# Patient Record
Sex: Female | Born: 1950
Health system: Southern US, Community
[De-identification: ages and names within clinical notes are randomized; demographics above are authoritative.]

## PROBLEM LIST (undated history)

## (undated) DIAGNOSIS — J302 Other seasonal allergic rhinitis: Secondary | ICD-10-CM

## (undated) DIAGNOSIS — K635 Polyp of colon: Secondary | ICD-10-CM

## (undated) DIAGNOSIS — E119 Type 2 diabetes mellitus without complications: Secondary | ICD-10-CM

## (undated) DIAGNOSIS — E785 Hyperlipidemia, unspecified: Secondary | ICD-10-CM

## (undated) DIAGNOSIS — S2220XA Unspecified fracture of sternum, initial encounter for closed fracture: Secondary | ICD-10-CM

## (undated) DIAGNOSIS — S134XXA Sprain of ligaments of cervical spine, initial encounter: Secondary | ICD-10-CM

## (undated) HISTORY — DX: Hyperlipidemia, unspecified: E78.5

## (undated) HISTORY — DX: Unspecified fracture of sternum, initial encounter for closed fracture: S22.20XA

## (undated) HISTORY — DX: Type 2 diabetes mellitus without complications: E11.9

## (undated) HISTORY — DX: Sprain of ligaments of cervical spine, initial encounter: S13.4XXA

## (undated) HISTORY — PX: COLONOSCOPY: SHX174

---

## 2005-01-16 ENCOUNTER — Ambulatory Visit: Payer: Self-pay | Admitting: Family Medicine

## 2005-01-30 ENCOUNTER — Ambulatory Visit: Payer: Self-pay | Admitting: Family Medicine

## 2005-02-19 ENCOUNTER — Ambulatory Visit: Payer: Self-pay | Admitting: Family Medicine

## 2005-02-26 ENCOUNTER — Ambulatory Visit: Payer: Self-pay | Admitting: Family Medicine

## 2005-05-07 ENCOUNTER — Ambulatory Visit: Payer: Self-pay | Admitting: Family Medicine

## 2005-07-02 ENCOUNTER — Ambulatory Visit: Payer: Self-pay | Admitting: Family Medicine

## 2005-08-20 ENCOUNTER — Ambulatory Visit: Payer: Self-pay | Admitting: Family Medicine

## 2005-12-16 ENCOUNTER — Ambulatory Visit: Payer: Self-pay | Admitting: Family Medicine

## 2006-01-25 ENCOUNTER — Ambulatory Visit: Payer: Self-pay | Admitting: Family Medicine

## 2006-03-25 ENCOUNTER — Ambulatory Visit: Payer: Self-pay | Admitting: Family Medicine

## 2006-06-21 ENCOUNTER — Ambulatory Visit: Payer: Self-pay | Admitting: Family Medicine

## 2006-09-13 ENCOUNTER — Ambulatory Visit: Payer: Self-pay | Admitting: Family Medicine

## 2006-12-21 ENCOUNTER — Ambulatory Visit: Payer: Self-pay | Admitting: Family Medicine

## 2007-01-21 ENCOUNTER — Ambulatory Visit: Payer: Self-pay | Admitting: Family Medicine

## 2008-02-14 ENCOUNTER — Other Ambulatory Visit: Admission: RE | Admit: 2008-02-14 | Discharge: 2008-02-14 | Payer: Self-pay | Admitting: Obstetrics and Gynecology

## 2009-09-03 ENCOUNTER — Other Ambulatory Visit: Admission: RE | Admit: 2009-09-03 | Discharge: 2009-09-03 | Payer: Self-pay | Admitting: Obstetrics and Gynecology

## 2015-08-22 DIAGNOSIS — E119 Type 2 diabetes mellitus without complications: Secondary | ICD-10-CM | POA: Insufficient documentation

## 2015-08-22 DIAGNOSIS — I10 Essential (primary) hypertension: Secondary | ICD-10-CM | POA: Insufficient documentation

## 2015-12-02 ENCOUNTER — Ambulatory Visit (INDEPENDENT_AMBULATORY_CARE_PROVIDER_SITE_OTHER): Payer: BLUE CROSS/BLUE SHIELD | Admitting: Family Medicine

## 2015-12-02 ENCOUNTER — Encounter: Payer: Self-pay | Admitting: Family Medicine

## 2015-12-02 VITALS — BP 114/69 | HR 72 | Temp 97.4°F | Ht 62.0 in | Wt 126.0 lb

## 2015-12-02 DIAGNOSIS — J0100 Acute maxillary sinusitis, unspecified: Secondary | ICD-10-CM | POA: Diagnosis not present

## 2015-12-02 MED ORDER — AMOXICILLIN-POT CLAVULANATE 875-125 MG PO TABS: 1.0000 | ORAL_TABLET | Freq: Two times a day (BID) | ORAL | Status: DC

## 2015-12-02 NOTE — Progress Notes (Signed)
   Subjective:    Patient ID: Misty [name], female    DOB: 06-Jul-1951, 65 y.o.   MRN: [id number]  HPI Patient here today for cough, congestion and facial pressure. These symptoms have been present off and on since October. She denies any chest congestion or wheezing. There is been no fever.  She is transferring here from another practice. She has diabetes and takes metformin and glipizide. She also is on a statin drug for her lipids.      There are no active problems to display for this patient.  Outpatient Encounter Prescriptions as of 12/02/2015  Medication Sig  . glipiZIDE (GLIPIZIDE XL) 5 MG 24 hr tablet Take 1 tablet by mouth daily.  . metFORMIN (GLUCOPHAGE-XR) 500 MG 24 hr tablet Take 1 tablet by mouth daily.  . simvastatin (ZOCOR) 40 MG tablet Take 1 tablet by mouth daily.  . vitamin B-12 (CYANOCOBALAMIN) 250 MCG tablet Take by mouth.  . vitamin E 400 UNIT capsule Take by mouth.   No facility-administered encounter medications on file as of 12/02/2015.      Review of Systems  Constitutional: Negative.  Negative for fever.  HENT: Positive for congestion, sinus pressure and sore throat.   Eyes: Negative.   Respiratory: Positive for cough.   Cardiovascular: Negative.   Gastrointestinal: Negative.   Endocrine: Negative.   Genitourinary: Negative.   Musculoskeletal: Negative.   Skin: Negative.   Allergic/Immunologic: Negative.   Neurological: Negative.   Hematological: Negative.   Psychiatric/Behavioral: Negative.        Objective:   Physical Exam  Constitutional: She is oriented to person, place, and time. She appears well-developed and well-nourished.  HENT:  Head: Normocephalic.  Maxillary sinuses are tender to percussion  Cardiovascular: Normal rate and regular rhythm.   Pulmonary/Chest: Effort normal and breath sounds normal.  Neurological: She is alert and oriented to person, place, and time.   BP 114/69 mmHg  Pulse 72  Temp(Src) 97.4 F (36.3 C)  (Oral)  Ht 5\' 2"  (1.575 m)  Wt 126 lb (57.153 kg)  BMI 23.04 kg/m2        Assessment & Plan:   1. Acute maxillary sinusitis, recurrence not specified Given the longevity of her symptoms and colored drainage will treat: Amoxicillin. Energy 75 mg twice a day 10 days. Continue Mucinex area  She will return in one month. Hopefully her records from all practice will be here and we can see how her diabetes is doing well with A1c and lipids.  Wardell Honour MD

## 2015-12-11 ENCOUNTER — Other Ambulatory Visit: Payer: Self-pay | Admitting: Family Medicine

## 2015-12-11 ENCOUNTER — Other Ambulatory Visit: Payer: Self-pay

## 2015-12-11 MED ORDER — METFORMIN HCL ER 500 MG PO TB24: 500.0000 mg | ORAL_TABLET | Freq: Every day | ORAL | Status: DC

## 2015-12-12 NOTE — Telephone Encounter (Signed)
Seen 1/23 - miller for sinus -- no labs - no chronic visits Can we approve until a appt or NTBS first

## 2015-12-12 NOTE — Telephone Encounter (Signed)
Pt has only been seen here once, no labs in epic?

## 2015-12-13 MED ORDER — SIMVASTATIN 40 MG PO TABS: 40.0000 mg | ORAL_TABLET | Freq: Every day | ORAL | Status: DC

## 2015-12-13 MED ORDER — GLIPIZIDE ER 5 MG PO TB24: 5.0000 mg | ORAL_TABLET | Freq: Every day | ORAL | Status: DC

## 2015-12-13 MED ORDER — METFORMIN HCL ER 500 MG PO TB24: 500.0000 mg | ORAL_TABLET | Freq: Every day | ORAL | Status: DC

## 2015-12-17 ENCOUNTER — Ambulatory Visit (INDEPENDENT_AMBULATORY_CARE_PROVIDER_SITE_OTHER): Payer: BLUE CROSS/BLUE SHIELD | Admitting: Family Medicine

## 2015-12-17 ENCOUNTER — Encounter: Payer: Self-pay | Admitting: Family Medicine

## 2015-12-17 VITALS — BP 122/79 | HR 84 | Temp 97.9°F | Ht 62.0 in | Wt 125.0 lb

## 2015-12-17 DIAGNOSIS — J320 Chronic maxillary sinusitis: Secondary | ICD-10-CM | POA: Insufficient documentation

## 2015-12-17 MED ORDER — AZITHROMYCIN 250 MG PO TABS: ORAL_TABLET | ORAL | Status: DC

## 2015-12-17 MED ORDER — FLUTICASONE PROPIONATE 50 MCG/ACT NA SUSP: 2.0000 | Freq: Every day | NASAL | Status: DC

## 2015-12-17 NOTE — Progress Notes (Signed)
   Subjective:    Patient ID: Misty [name], female    DOB: 12-20-1950, 65 y.o.   MRN: [id number]  HPI Patient here today for cough and congestion that is on- going since last office visit 12/02/15. Patient tells me she is not any better from her last visit and her prescription of Augmentin and Mucinex. Complaints are runny nose sneezing coughing years or stopped up and postnasal drainage that is thick and white. When questioned about the possibility of allergies she has no history of same but admits it could be. Certainly some of her symptoms are allergic sounding. She feels like most of her symptoms are from the neck up other than in her chest.       There are no active problems to display for this patient.  Outpatient Encounter Prescriptions as of 12/17/2015  Medication Sig  . glipiZIDE (GLIPIZIDE XL) 5 MG 24 hr tablet Take 1 tablet (5 mg total) by mouth daily.  . metFORMIN (GLUCOPHAGE-XR) 500 MG 24 hr tablet Take 1 tablet (500 mg total) by mouth daily.  . simvastatin (ZOCOR) 40 MG tablet Take 1 tablet (40 mg total) by mouth daily.  . vitamin B-12 (CYANOCOBALAMIN) 250 MCG tablet Take by mouth.  . vitamin E 400 UNIT capsule Take by mouth.  . [DISCONTINUED] amoxicillin-clavulanate (AUGMENTIN) 875-125 MG tablet Take 1 tablet by mouth 2 (two) times daily.   No facility-administered encounter medications on file as of 12/17/2015.      Review of Systems  Constitutional: Negative.   HENT: Positive for congestion (thick - white), postnasal drip and sneezing.   Eyes: Negative.   Respiratory: Positive for cough.   Cardiovascular: Negative.   Gastrointestinal: Negative.   Endocrine: Negative.   Genitourinary: Negative.   Musculoskeletal: Negative.   Skin: Negative.   Allergic/Immunologic: Negative.   Neurological: Negative.   Hematological: Negative.   Psychiatric/Behavioral: Negative.        Objective:   Physical Exam  Constitutional: She is oriented to person, place, and time.  She appears well-developed and well-nourished.  HENT:  Head: Normocephalic.  Right Ear: External ear normal.  Left Ear: External ear normal.  Mouth/Throat: Oropharynx is clear and moist. No oropharyngeal exudate.  Pulmonary/Chest: Effort normal and breath sounds normal.  Neurological: She is alert and oriented to person, place, and time.  Psychiatric: She has a normal mood and affect. Her behavior is normal.   BP 122/79 mmHg  Pulse 84  Temp(Src) 97.9 F (36.6 C) (Oral)  Ht 5\' 2"  (1.575 m)  Wt 125 lb (56.7 kg)  BMI 22.86 kg/m2        Assessment & Plan:  1. Sinusitis, maxillary, chronic I'm beginning to suspect her symptoms are more allergic than infectious but will try one core more course of Zithromax along with Flonase. Also talked about ways to thin mucus such as hot showers hyper Rizer drinking plenty of fluids. It seems that she does not want to continue with Mucinex.  Wardell Honour MD

## 2015-12-17 NOTE — Addendum Note (Signed)
Addended by: Zannie Cove on: 12/17/2015 01:53 PM   Modules accepted: Orders

## 2016-02-24 ENCOUNTER — Other Ambulatory Visit: Payer: Self-pay | Admitting: *Deleted

## 2016-02-24 NOTE — Telephone Encounter (Signed)
No labs in EPIC

## 2016-02-25 MED ORDER — SIMVASTATIN 40 MG PO TABS: 40.0000 mg | ORAL_TABLET | Freq: Every day | ORAL | Status: DC

## 2016-02-27 ENCOUNTER — Telehealth: Payer: Self-pay | Admitting: Family Medicine

## 2016-02-27 DIAGNOSIS — E119 Type 2 diabetes mellitus without complications: Secondary | ICD-10-CM

## 2016-02-27 DIAGNOSIS — I10 Essential (primary) hypertension: Secondary | ICD-10-CM

## 2016-02-27 NOTE — Telephone Encounter (Signed)
Lipids, A1C, CMP

## 2016-02-28 NOTE — Telephone Encounter (Signed)
Detailed message left for patient that order has been placed.

## 2016-03-04 ENCOUNTER — Other Ambulatory Visit: Payer: BLUE CROSS/BLUE SHIELD

## 2016-03-04 DIAGNOSIS — E119 Type 2 diabetes mellitus without complications: Secondary | ICD-10-CM

## 2016-03-04 DIAGNOSIS — I10 Essential (primary) hypertension: Secondary | ICD-10-CM

## 2016-03-04 LAB — CMP14+EGFR
ALBUMIN: 4.2 g/dL (ref 3.6–4.8)
ALK PHOS: 87 IU/L (ref 39–117)
ALT: 18 IU/L (ref 0–32)
AST: 21 IU/L (ref 0–40)
Albumin/Globulin Ratio: 1.7 (ref 1.2–2.2)
BILIRUBIN TOTAL: 0.4 mg/dL (ref 0.0–1.2)
BUN / CREAT RATIO: 19 (ref 12–28)
BUN: 16 mg/dL (ref 8–27)
CHLORIDE: 99 mmol/L (ref 96–106)
CO2: 24 mmol/L (ref 18–29)
Calcium: 9.5 mg/dL (ref 8.7–10.3)
Creatinine, Ser: 0.85 mg/dL (ref 0.57–1.00)
GFR calc Af Amer: 83 mL/min/{1.73_m2} (ref >59)
GFR calc non Af Amer: 72 mL/min/{1.73_m2} (ref >59)
GLOBULIN, TOTAL: 2.5 g/dL (ref 1.5–4.5)
Glucose: 146 mg/dL — ABNORMAL HIGH (ref 65–99)
Potassium: 4.9 mmol/L (ref 3.5–5.2)
SODIUM: 141 mmol/L (ref 134–144)
Total Protein: 6.7 g/dL (ref 6.0–8.5)

## 2016-03-04 LAB — LIPID PANEL
CHOLESTEROL TOTAL: 296 mg/dL — AB (ref 100–199)
Chol/HDL Ratio: 3.7 ratio units (ref 0.0–4.4)
HDL: 81 mg/dL (ref >39)
LDL CALC: 163 mg/dL — AB (ref 0–99)
TRIGLYCERIDES: 258 mg/dL — AB (ref 0–149)
VLDL CHOLESTEROL CAL: 52 mg/dL — AB (ref 5–40)

## 2016-03-04 LAB — BAYER DCA HB A1C WAIVED: HB A1C (BAYER DCA - WAIVED): 7.8 % — ABNORMAL HIGH (ref <7.0)

## 2016-03-05 ENCOUNTER — Encounter: Payer: Self-pay | Admitting: Family Medicine

## 2016-03-05 ENCOUNTER — Ambulatory Visit (INDEPENDENT_AMBULATORY_CARE_PROVIDER_SITE_OTHER): Payer: BLUE CROSS/BLUE SHIELD | Admitting: Family Medicine

## 2016-03-05 ENCOUNTER — Encounter (INDEPENDENT_AMBULATORY_CARE_PROVIDER_SITE_OTHER): Payer: Self-pay

## 2016-03-05 ENCOUNTER — Telehealth: Payer: Self-pay | Admitting: Family Medicine

## 2016-03-05 VITALS — BP 127/68 | HR 85 | Temp 97.2°F | Ht 62.0 in | Wt 125.8 lb

## 2016-03-05 DIAGNOSIS — E785 Hyperlipidemia, unspecified: Secondary | ICD-10-CM | POA: Diagnosis not present

## 2016-03-05 DIAGNOSIS — K219 Gastro-esophageal reflux disease without esophagitis: Secondary | ICD-10-CM

## 2016-03-05 DIAGNOSIS — E1169 Type 2 diabetes mellitus with other specified complication: Secondary | ICD-10-CM | POA: Insufficient documentation

## 2016-03-05 DIAGNOSIS — E119 Type 2 diabetes mellitus without complications: Secondary | ICD-10-CM | POA: Diagnosis not present

## 2016-03-05 MED ORDER — SITAGLIPTIN PHOS-METFORMIN HCL 50-500 MG PO TABS: 1.0000 | ORAL_TABLET | Freq: Two times a day (BID) | ORAL | Status: DC

## 2016-03-05 MED ORDER — RANITIDINE HCL 150 MG PO TABS: 150.0000 mg | ORAL_TABLET | Freq: Two times a day (BID) | ORAL | Status: DC

## 2016-03-05 MED ORDER — SIMVASTATIN 40 MG PO TABS: 40.0000 mg | ORAL_TABLET | Freq: Every day | ORAL | Status: DC

## 2016-03-05 NOTE — Telephone Encounter (Signed)
Have her stay on her previous meds for now and increase the metformin to twice a day until I can find one that is more affordable for her. Also asked her how much it was going to cost her so I can have a gauge of we can send in the future

## 2016-03-05 NOTE — Telephone Encounter (Signed)
Patient aware of medication change and states that Janumet was $160

## 2016-03-05 NOTE — Progress Notes (Signed)
BP 127/68 mmHg  Pulse 85  Temp(Src) 97.2 F (36.2 C) (Oral)  Ht 5\' 2"  (1.575 m)  Wt 125 lb 12.8 oz (57.063 kg)  BMI 23.00 kg/m2   Subjective:    Patient ID: Misty [name], female    DOB: Dec 02, 1950, 65 y.o.   MRN: [id number]  HPI: Misty [name] is a 65 y.o. female presenting on 03/05/2016 for Follow-up   HPI Type 2 diabetes follow-up Patient is coming in today for follow-up on type 2 diabetes. This is the first time that she has seen [place] for her diabetes. Before she was seen another primary care physician in town. She has been on metformin 500 mg daily and glipizide 5 mg daily. Her hemoglobin A1c that she had drawn yesterday came back as 7.8. She denies any issues with her vision that is changed or with her feet. She is going to see her ophthalmologist in July of this year and artery has an appointment. She sees them every year. She is not currently on an ACE inhibitor and may have been intolerant of them in the past because of hypotension. She says her blood sugars run anywhere from 90s to 140 5 in the morning. She does not often check in the afternoons.  Hyperlipidemia This is the first of the patient is coming to [place] for her cholesterol. She has been on simvastatin 40 mg but has been off of that more recently for one month because she ran out. Her LDL cholesterol 163 yesterday and her triglycerides came back in the 250s. Patient denies headaches, blurred vision, chest pains, shortness of breath, or weakness. Denies any side effects from medication and is content with current medication.   Persisting cough Patient has been having a persistent cough and feeling like something is stuck in her throat that she cannot cough up her swallow down. She denies any actual heart burning sensation but she does have a lot of frequent hiccups and sometimes belching. She also has been having this persistent cough because of this tickle in the back of her throat. The cough is nonproductive. She has  been on both antihistamines and nasal steroids and does not feel like they are helping at all.  Relevant past medical, surgical, family and social history reviewed and updated as indicated. Interim medical history since our last visit reviewed. Allergies and medications reviewed and updated.  Review of Systems  Constitutional: Negative for fever and chills.  HENT: Positive for sore throat. Negative for congestion, ear discharge, ear pain, rhinorrhea, sinus pressure and sneezing.   Eyes: Negative for redness and visual disturbance.  Respiratory: Positive for cough. Negative for chest tightness and shortness of breath.   Cardiovascular: Negative for chest pain and leg swelling.  Genitourinary: Negative for dysuria and difficulty urinating.  Musculoskeletal: Negative for back pain and gait problem.  Skin: Negative for rash.  Neurological: Negative for light-headedness and headaches.  Psychiatric/Behavioral: Negative for behavioral problems and agitation.  All other systems reviewed and are negative.   Per HPI unless specifically indicated above     Medication List       This list is accurate as of: 03/05/16  1:31 PM.  Always use your most recent med list.               fluticasone 50 MCG/ACT nasal spray  Commonly known as:  FLONASE  Place 2 sprays into both nostrils daily.     ranitidine 150 MG tablet  Commonly known as:  ZANTAC  Take 1 tablet (150 mg total) by mouth 2 (two) times daily.     simvastatin 40 MG tablet  Commonly known as:  ZOCOR  Take 1 tablet (40 mg total) by mouth daily.     sitaGLIPtin-metformin 50-500 MG tablet  Commonly known as:  JANUMET  Take 1 tablet by mouth 2 (two) times daily with a meal.     vitamin B-12 250 MCG tablet  Commonly known as:  CYANOCOBALAMIN  Take by mouth.     vitamin E 400 UNIT capsule  Take by mouth.           Objective:    BP 127/68 mmHg  Pulse 85  Temp(Src) 97.2 F (36.2 C) (Oral)  Ht 5\' 2"  (1.575 m)  Wt 125 lb  12.8 oz (57.063 kg)  BMI 23.00 kg/m2  Wt Readings from Last 3 Encounters:  03/05/16 125 lb 12.8 oz (57.063 kg)  12/17/15 125 lb (56.7 kg)  12/02/15 126 lb (57.153 kg)    Physical Exam  Constitutional: She is oriented to person, place, and time. She appears well-developed and well-nourished. No distress.  HENT:  Right Ear: External ear normal.  Left Ear: External ear normal.  Nose: Nose normal.  Mouth/Throat: Oropharynx is clear and moist. No oropharyngeal exudate.  Eyes: Conjunctivae and EOM are normal. Pupils are equal, round, and reactive to light.  Neck: Neck supple. No thyromegaly present.  Cardiovascular: Normal rate, regular rhythm, normal heart sounds and intact distal pulses.   No murmur heard. Pulmonary/Chest: Effort normal and breath sounds normal. No respiratory distress. She has no wheezes.  Abdominal: Soft. Bowel sounds are normal. She exhibits no distension. There is tenderness (Mild epigastric tenderness). There is no rebound.  Musculoskeletal: Normal range of motion. She exhibits no edema or tenderness.  Lymphadenopathy:    She has no cervical adenopathy.  Neurological: She is alert and oriented to person, place, and time. Coordination normal.  Skin: Skin is warm and dry. No rash noted. She is not diaphoretic.  Psychiatric: She has a normal mood and affect. Her behavior is normal.  Nursing note and vitals reviewed.  Diabetic Foot Exam - Simple   Simple Foot Form  Diabetic Foot exam was performed with the following findings:  Yes 03/05/2016  1:46 PM  Visual Inspection  No deformities, no ulcerations, no other skin breakdown bilaterally:  Yes  Sensation Testing  Intact to touch and monofilament testing bilaterally:  Yes  Pulse Check  Posterior Tibialis and Dorsalis pulse intact bilaterally:  Yes  Comments      Hemoglobin A1c: 7.8    Assessment & Plan:       Problem List Items Addressed This Visit      Endocrine   Type 2 diabetes mellitus ([place]) - Primary     Relevant Medications   sitaGLIPtin-metformin (JANUMET) 50-500 MG tablet   simvastatin (ZOCOR) 40 MG tablet   Other Relevant Orders   Microalbumin / creatinine urine ratio     Other   Hyperlipidemia LDL goal <130   Relevant Medications   simvastatin (ZOCOR) 40 MG tablet    Other Visit Diagnoses    Gastroesophageal reflux disease without esophagitis        Relevant Medications    ranitidine (ZANTAC) 150 MG tablet        Follow up plan: Return in about 3 months (around 06/04/2016), or if symptoms worsen or fail to improve, for Diabetes follow-up and A1c.  Counseling provided for all of the vaccine components Orders Placed This  Encounter  Procedures  . Microalbumin / creatinine urine ratio    Caryl Pina, MD [facility] 03/05/2016, 1:31 PM

## 2016-03-06 ENCOUNTER — Ambulatory Visit: Payer: BLUE CROSS/BLUE SHIELD | Admitting: Family Medicine

## 2016-03-06 ENCOUNTER — Telehealth: Payer: Self-pay

## 2016-03-06 LAB — MICROALBUMIN / CREATININE URINE RATIO
CREATININE, UR: 124.4 mg/dL
MICROALB/CREAT RATIO: 6.7 mg/g{creat} (ref 0.0–30.0)
MICROALBUM., U, RANDOM: 8.3 ug/mL

## 2016-03-06 MED ORDER — CANAGLIFLOZIN-METFORMIN HCL ER 50-500 MG PO TB24: 1.0000 | ORAL_TABLET | Freq: Two times a day (BID) | ORAL | Status: DC

## 2016-03-06 NOTE — Addendum Note (Signed)
Addended by: Wardell Heath on: 03/06/2016 11:22 AM   Modules accepted: Orders

## 2016-03-06 NOTE — Telephone Encounter (Signed)
Can we try and send her Invokamet 50-500, 1 tablet twice a day #60 with 2 refills. Call the pharmacy as well and find out why the Janumet cost $160 and if it was just her deductible or if that would be a continuing monthly payment. See if the pharmacy can tell us if the Invokamet is cheaper or better covered by her insurance and how much it might cost her. I want her to try to be on either the Invokamet or Janumet

## 2016-03-06 NOTE — Telephone Encounter (Signed)
Insurance prior authorized Invokamet through 03/06/17

## 2016-03-10 ENCOUNTER — Telehealth: Payer: Self-pay | Admitting: Family Medicine

## 2016-03-10 MED ORDER — METFORMIN HCL ER 500 MG PO TB24: 500.0000 mg | ORAL_TABLET | Freq: Two times a day (BID) | ORAL | Status: DC

## 2016-03-10 MED ORDER — GLIPIZIDE ER 5 MG PO TB24: 5.0000 mg | ORAL_TABLET | Freq: Every day | ORAL | Status: DC

## 2016-03-10 NOTE — Telephone Encounter (Signed)
Sent metformin and glipizide for her

## 2016-03-10 NOTE — Telephone Encounter (Signed)
Detailed message left for patient that medication has been sent to pharmacy.

## 2016-05-19 ENCOUNTER — Other Ambulatory Visit: Payer: BLUE CROSS/BLUE SHIELD

## 2016-06-04 ENCOUNTER — Ambulatory Visit: Payer: BLUE CROSS/BLUE SHIELD | Admitting: Family Medicine

## 2016-06-05 ENCOUNTER — Other Ambulatory Visit: Payer: Self-pay | Admitting: *Deleted

## 2016-06-05 DIAGNOSIS — Z1231 Encounter for screening mammogram for malignant neoplasm of breast: Secondary | ICD-10-CM

## 2016-06-12 ENCOUNTER — Other Ambulatory Visit: Payer: Self-pay | Admitting: Family Medicine

## 2016-06-12 ENCOUNTER — Other Ambulatory Visit: Payer: BLUE CROSS/BLUE SHIELD

## 2016-06-12 DIAGNOSIS — E785 Hyperlipidemia, unspecified: Secondary | ICD-10-CM

## 2016-06-12 DIAGNOSIS — E119 Type 2 diabetes mellitus without complications: Secondary | ICD-10-CM

## 2016-06-12 DIAGNOSIS — Z1159 Encounter for screening for other viral diseases: Secondary | ICD-10-CM

## 2016-06-12 LAB — BAYER DCA HB A1C WAIVED: HB A1C: 7.5 % — AB (ref <7.0)

## 2016-06-13 LAB — LIPID PANEL
CHOL/HDL RATIO: 2.5 ratio (ref 0.0–4.4)
CHOLESTEROL TOTAL: 212 mg/dL — AB (ref 100–199)
HDL: 84 mg/dL (ref >39)
LDL CALC: 89 mg/dL (ref 0–99)
Triglycerides: 196 mg/dL — ABNORMAL HIGH (ref 0–149)
VLDL CHOLESTEROL CAL: 39 mg/dL (ref 5–40)

## 2016-06-13 LAB — HEPATITIS C ANTIBODY: Hep C Virus Ab: <0.1 s/co ratio (ref 0.0–0.9)

## 2016-06-22 ENCOUNTER — Ambulatory Visit (INDEPENDENT_AMBULATORY_CARE_PROVIDER_SITE_OTHER): Payer: BLUE CROSS/BLUE SHIELD | Admitting: Family Medicine

## 2016-06-22 ENCOUNTER — Encounter: Payer: Self-pay | Admitting: Family Medicine

## 2016-06-22 VITALS — BP 135/86 | HR 68 | Temp 97.5°F | Ht 62.0 in | Wt 126.8 lb

## 2016-06-22 DIAGNOSIS — E785 Hyperlipidemia, unspecified: Secondary | ICD-10-CM | POA: Diagnosis not present

## 2016-06-22 DIAGNOSIS — E119 Type 2 diabetes mellitus without complications: Secondary | ICD-10-CM

## 2016-06-22 MED ORDER — GLIPIZIDE ER 5 MG PO TB24: 5.0000 mg | ORAL_TABLET | Freq: Every day | ORAL | 3 refills | Status: DC

## 2016-06-22 MED ORDER — METFORMIN HCL ER 500 MG PO TB24: 500.0000 mg | ORAL_TABLET | Freq: Two times a day (BID) | ORAL | 3 refills | Status: DC

## 2016-06-22 MED ORDER — EMPAGLIFLOZIN 10 MG PO TABS: 10.0000 mg | ORAL_TABLET | Freq: Every day | ORAL | 3 refills | Status: DC

## 2016-06-22 MED ORDER — SIMVASTATIN 40 MG PO TABS: 40.0000 mg | ORAL_TABLET | Freq: Every day | ORAL | 2 refills | Status: DC

## 2016-06-22 NOTE — Progress Notes (Signed)
BP 135/86 (BP Location: Left Arm, Patient Position: Sitting, Cuff Size: Normal)   Pulse 68   Temp 97.5 F (36.4 C) (Oral)   Ht 5\' 2"  (1.575 m)   Wt 126 lb 12.8 oz (57.5 kg)   BMI 23.19 kg/m    Subjective:    Patient ID: Misty [name], female    DOB: Oct 14, 1951, 65 y.o.   MRN: [id number]  HPI: Misty [name] is a 65 y.o. female presenting on 06/22/2016 for Diabetes (followup, labs performed last week)   HPI Diabetes recheck Patient is coming in today for a diabetic recheck. Because of insurance she was unable to afford [place] and Invokana. She is currently taking metformin 500 mg once a day. And glipizide 5 mg daily. Patient is not on an ACE inhibitor currently. She denies any issues with her feet or with her vision. She has seen an ophthalmologist previously and plans to go see 1 next month. Patient is on statin. Her hemoglobin A1c improved from 7.8 down to 7.5. Encouraged taking the metformin more frequently.  Hyperlipidemia Patient is coming in for cholesterol recheck. She denies any issues with myalgias or other issues with her medication. She has been taking them every evening. Her cholesterol was checked a few days ago and has much improved from her previous. Her LDL came back as 89 and triglycerides is 196 and HDL was 84.  Relevant past medical, surgical, family and social history reviewed and updated as indicated. Interim medical history since our last visit reviewed. Allergies and medications reviewed and updated.  Review of Systems  Constitutional: Negative for chills and fever.  HENT: Negative for congestion, ear discharge and ear pain.   Eyes: Negative for redness and visual disturbance.  Respiratory: Negative for chest tightness and shortness of breath.   Cardiovascular: Negative for chest pain and leg swelling.  Genitourinary: Negative for difficulty urinating and dysuria.  Musculoskeletal: Negative for back pain and gait problem.  Skin: Negative for rash.    Neurological: Negative for light-headedness and headaches.  Psychiatric/Behavioral: Negative for agitation and behavioral problems.  All other systems reviewed and are negative.   Per HPI unless specifically indicated above     Medication List       Accurate as of 06/22/16  8:56 AM. Always use your most recent med list.          empagliflozin 10 MG Tabs tablet Commonly known as:  JARDIANCE Take 10 mg by mouth daily.   glipiZIDE 5 MG 24 hr tablet Commonly known as:  GLIPIZIDE XL Take 1 tablet (5 mg total) by mouth daily.   metFORMIN 500 MG 24 hr tablet Commonly known as:  GLUCOPHAGE-XR Take 1 tablet (500 mg total) by mouth 2 (two) times daily.   simvastatin 40 MG tablet Commonly known as:  ZOCOR Take 1 tablet (40 mg total) by mouth daily.          Objective:    BP 135/86 (BP Location: Left Arm, Patient Position: Sitting, Cuff Size: Normal)   Pulse 68   Temp 97.5 F (36.4 C) (Oral)   Ht 5\' 2"  (1.575 m)   Wt 126 lb 12.8 oz (57.5 kg)   BMI 23.19 kg/m   Wt Readings from Last 3 Encounters:  06/22/16 126 lb 12.8 oz (57.5 kg)  03/05/16 125 lb 12.8 oz (57.1 kg)  12/17/15 125 lb (56.7 kg)    Physical Exam  Constitutional: She is oriented to person, place, and time. She appears well-developed and well-nourished. No  distress.  Eyes: Conjunctivae and EOM are normal. Pupils are equal, round, and reactive to light.  Neck: Neck supple. No thyromegaly present.  Cardiovascular: Normal rate, regular rhythm, normal heart sounds and intact distal pulses.   No murmur heard. Pulmonary/Chest: Effort normal and breath sounds normal. No respiratory distress. She has no wheezes.  Musculoskeletal: Normal range of motion. She exhibits no edema or tenderness.  Lymphadenopathy:    She has no cervical adenopathy.  Neurological: She is alert and oriented to person, place, and time. Coordination normal.  Skin: Skin is warm and dry. No rash noted. She is not diaphoretic.  Psychiatric:  She has a normal mood and affect. Her behavior is normal.  Nursing note and vitals reviewed.   Results for orders placed or performed in visit on 06/12/16  Bayer DCA Hb A1c Waived  Result Value Ref Range   Bayer DCA Hb A1c Waived 7.5 (H) <7.0 %  Hepatitis C antibody  Result Value Ref Range   Hep C Virus Ab <0.1 0.0 - 0.9 s/co ratio  Lipid panel  Result Value Ref Range   Cholesterol, Total 212 (H) 100 - 199 mg/dL   Triglycerides 196 (H) 0 - 149 mg/dL   HDL 84 >39 mg/dL   VLDL Cholesterol Cal 39 5 - 40 mg/dL   LDL Calculated 89 0 - 99 mg/dL   Chol/HDL Ratio 2.5 0.0 - 4.4 ratio units      Assessment & Plan:   Problem List Items Addressed This Visit      Endocrine   Type 2 diabetes mellitus ([facility]) - Primary   Relevant Medications   simvastatin (ZOCOR) 40 MG tablet   metFORMIN (GLUCOPHAGE-XR) 500 MG 24 hr tablet   glipiZIDE (GLIPIZIDE XL) 5 MG 24 hr tablet   empagliflozin (JARDIANCE) 10 MG TABS tablet     Other   Hyperlipidemia LDL goal <130   Relevant Medications   simvastatin (ZOCOR) 40 MG tablet    Other Visit Diagnoses   None.      Follow up plan: Return in about 3 months (around 09/22/2016), or if symptoms worsen or fail to improve, for Recheck diabetes.  Counseling provided for all of the vaccine components No orders of the defined types were placed in this encounter.   Caryl Pina, MD [facility] 06/22/2016, 8:56 AM

## 2016-06-25 ENCOUNTER — Telehealth: Payer: Self-pay | Admitting: Family Medicine

## 2016-06-25 NOTE — Telephone Encounter (Signed)
Spoke to pt has upcoming apts

## 2016-07-03 LAB — HM DIABETES EYE EXAM

## 2016-09-25 ENCOUNTER — Other Ambulatory Visit: Payer: BLUE CROSS/BLUE SHIELD

## 2016-09-25 DIAGNOSIS — E785 Hyperlipidemia, unspecified: Secondary | ICD-10-CM

## 2016-09-25 DIAGNOSIS — E119 Type 2 diabetes mellitus without complications: Secondary | ICD-10-CM

## 2016-09-25 LAB — BAYER DCA HB A1C WAIVED: HB A1C (BAYER DCA - WAIVED): 7.8 % — ABNORMAL HIGH (ref <7.0)

## 2016-09-26 LAB — LIPID PANEL
CHOL/HDL RATIO: 2.5 ratio (ref 0.0–4.4)
Cholesterol, Total: 184 mg/dL (ref 100–199)
HDL: 73 mg/dL (ref >39)
LDL CALC: 80 mg/dL (ref 0–99)
TRIGLYCERIDES: 156 mg/dL — AB (ref 0–149)
VLDL Cholesterol Cal: 31 mg/dL (ref 5–40)

## 2016-09-26 LAB — CMP14+EGFR
A/G RATIO: 2.1 (ref 1.2–2.2)
ALK PHOS: 81 IU/L (ref 39–117)
ALT: 19 IU/L (ref 0–32)
AST: 22 IU/L (ref 0–40)
Albumin: 4.2 g/dL (ref 3.6–4.8)
BUN/Creatinine Ratio: 21 (ref 12–28)
BUN: 18 mg/dL (ref 8–27)
Bilirubin Total: 0.4 mg/dL (ref 0.0–1.2)
CALCIUM: 9.3 mg/dL (ref 8.7–10.3)
CO2: 25 mmol/L (ref 18–29)
CREATININE: 0.84 mg/dL (ref 0.57–1.00)
Chloride: 100 mmol/L (ref 96–106)
GFR calc Af Amer: 84 mL/min/{1.73_m2} (ref >59)
GFR, EST NON AFRICAN AMERICAN: 73 mL/min/{1.73_m2} (ref >59)
Globulin, Total: 2 g/dL (ref 1.5–4.5)
Glucose: 127 mg/dL — ABNORMAL HIGH (ref 65–99)
POTASSIUM: 4.8 mmol/L (ref 3.5–5.2)
Sodium: 141 mmol/L (ref 134–144)
Total Protein: 6.2 g/dL (ref 6.0–8.5)

## 2016-10-05 ENCOUNTER — Ambulatory Visit (INDEPENDENT_AMBULATORY_CARE_PROVIDER_SITE_OTHER): Payer: BLUE CROSS/BLUE SHIELD | Admitting: Family Medicine

## 2016-10-05 ENCOUNTER — Encounter: Payer: Self-pay | Admitting: Family Medicine

## 2016-10-05 ENCOUNTER — Encounter (INDEPENDENT_AMBULATORY_CARE_PROVIDER_SITE_OTHER): Payer: Self-pay

## 2016-10-05 VITALS — BP 136/80 | HR 74 | Temp 97.7°F | Ht 62.0 in | Wt 128.1 lb

## 2016-10-05 DIAGNOSIS — E785 Hyperlipidemia, unspecified: Secondary | ICD-10-CM

## 2016-10-05 DIAGNOSIS — E119 Type 2 diabetes mellitus without complications: Secondary | ICD-10-CM

## 2016-10-05 DIAGNOSIS — Z23 Encounter for immunization: Secondary | ICD-10-CM | POA: Diagnosis not present

## 2016-10-05 MED ORDER — SITAGLIPTIN PHOS-METFORMIN HCL 50-1000 MG PO TABS: 1.0000 | ORAL_TABLET | Freq: Two times a day (BID) | ORAL | 3 refills | Status: DC

## 2016-10-05 NOTE — Progress Notes (Signed)
BP 136/80   Pulse 74   Temp 97.7 F (36.5 C) (Oral)   Ht 5\' 2"  (1.575 m)   Wt 128 lb 2 oz (58.1 kg)   BMI 23.43 kg/m    Subjective:    Patient ID: Misty [name], female    DOB: 1951/03/08, 65 y.o.   MRN: [id number]  HPI: Misty [name] is a 65 y.o. female presenting on 10/05/2016 for Diabetes (3 month followup )   HPI Type 2 diabetes recheck Patient is currently on metformin because she could not afford Jardiance but she cannot tolerate very much of the metformin because of stomach issues. Her hemoglobin A1c that she did on 09/25/2016 was 7.8 which compared to her previous A1c of 7.5 increased. She denies any issues with her feet. She had an ophthalmology exam on 07/03/2016. She is not currently on an ACE inhibitor but her last microalbumin was completely normal.  Hyperlipidemia recheck Patient is coming in for cholesterol recheck. She is currently on simvastatin 40 mg. She denies any issues with myalgias. She denies any focal numbness or weakness or headaches.  Relevant past medical, surgical, family and social history reviewed and updated as indicated. Interim medical history since our last visit reviewed. Allergies and medications reviewed and updated.  Review of Systems  Constitutional: Negative for chills and fever.  HENT: Negative for congestion, ear discharge and ear pain.   Eyes: Negative for redness and visual disturbance.  Respiratory: Negative for chest tightness and shortness of breath.   Cardiovascular: Negative for chest pain and leg swelling.  Genitourinary: Negative for difficulty urinating and dysuria.  Musculoskeletal: Negative for back pain and gait problem.  Skin: Negative for rash.  Neurological: Negative for light-headedness and headaches.  Psychiatric/Behavioral: Negative for agitation and behavioral problems.  All other systems reviewed and are negative.   Per HPI unless specifically indicated above     Medication List       Accurate  as of 10/05/16  5:04 PM. Always use your most recent med list.          glipiZIDE 5 MG 24 hr tablet Commonly known as:  GLIPIZIDE XL Take 1 tablet (5 mg total) by mouth daily.   simvastatin 40 MG tablet Commonly known as:  ZOCOR Take 1 tablet (40 mg total) by mouth daily.   sitaGLIPtin-metformin 50-1000 MG tablet Commonly known as:  JANUMET Take 1 tablet by mouth 2 (two) times daily with a meal.          Objective:    BP 136/80   Pulse 74   Temp 97.7 F (36.5 C) (Oral)   Ht 5\' 2"  (1.575 m)   Wt 128 lb 2 oz (58.1 kg)   BMI 23.43 kg/m   Wt Readings from Last 3 Encounters:  10/05/16 128 lb 2 oz (58.1 kg)  06/22/16 126 lb 12.8 oz (57.5 kg)  03/05/16 125 lb 12.8 oz (57.1 kg)    Physical Exam  Constitutional: She is oriented to person, place, and time. She appears well-developed and well-nourished. No distress.  Eyes: Conjunctivae are normal.  Cardiovascular: Normal rate, regular rhythm, normal heart sounds and intact distal pulses.   No murmur heard. Pulmonary/Chest: Effort normal and breath sounds normal. No respiratory distress. She has no wheezes. She has no rales.  Musculoskeletal: Normal range of motion. She exhibits no edema or tenderness.  Neurological: She is alert and oriented to person, place, and time. Coordination normal.  Skin: Skin is warm and dry. No rash noted.  She is not diaphoretic.  Psychiatric: She has a normal mood and affect. Her behavior is normal.  Nursing note and vitals reviewed.     Assessment & Plan:   Problem List Items Addressed This Visit      Endocrine   Type 2 diabetes mellitus ([place]) - Primary   Relevant Medications   sitaGLIPtin-metformin (JANUMET) 50-1000 MG tablet     Other   Hyperlipidemia LDL goal <130       Follow up plan: Return in about 3 months (around 01/05/2017), or if symptoms worsen or fail to improve, for Recheck diabetes.  Counseling provided for all of the vaccine components Orders Placed This Encounter    Procedures  . Pneumococcal conjugate vaccine 13-valent    Caryl Pina, MD [facility] 10/05/2016, 5:04 PM

## 2016-10-30 ENCOUNTER — Other Ambulatory Visit: Payer: Self-pay | Admitting: Family Medicine

## 2016-10-30 DIAGNOSIS — E119 Type 2 diabetes mellitus without complications: Secondary | ICD-10-CM

## 2016-11-04 ENCOUNTER — Encounter: Payer: BLUE CROSS/BLUE SHIELD | Admitting: *Deleted

## 2016-12-29 ENCOUNTER — Telehealth: Payer: Self-pay

## 2016-12-29 ENCOUNTER — Other Ambulatory Visit: Payer: Self-pay

## 2016-12-29 ENCOUNTER — Other Ambulatory Visit: Payer: BLUE CROSS/BLUE SHIELD

## 2016-12-29 DIAGNOSIS — E119 Type 2 diabetes mellitus without complications: Secondary | ICD-10-CM

## 2016-12-29 DIAGNOSIS — E785 Hyperlipidemia, unspecified: Secondary | ICD-10-CM

## 2016-12-29 DIAGNOSIS — E1165 Type 2 diabetes mellitus with hyperglycemia: Secondary | ICD-10-CM

## 2016-12-29 LAB — BAYER DCA HB A1C WAIVED: HB A1C (BAYER DCA - WAIVED): 7.4 % — ABNORMAL HIGH (ref <7.0)

## 2016-12-30 LAB — CMP14+EGFR
A/G RATIO: 1.9 (ref 1.2–2.2)
ALT: 24 IU/L (ref 0–32)
AST: 26 IU/L (ref 0–40)
Albumin: 4.2 g/dL (ref 3.6–4.8)
Alkaline Phosphatase: 70 IU/L (ref 39–117)
BUN/Creatinine Ratio: 12 (ref 12–28)
BUN: 9 mg/dL (ref 8–27)
CALCIUM: 9 mg/dL (ref 8.7–10.3)
CHLORIDE: 103 mmol/L (ref 96–106)
CO2: 23 mmol/L (ref 18–29)
Creatinine, Ser: 0.77 mg/dL (ref 0.57–1.00)
GFR calc Af Amer: 93 (ref >59)
GFR, EST NON AFRICAN AMERICAN: 81 (ref >59)
GLOBULIN, TOTAL: 2.2 (ref 1.5–4.5)
Glucose: 152 mg/dL — ABNORMAL HIGH (ref 65–99)
POTASSIUM: 4.8 mmol/L (ref 3.5–5.2)
SODIUM: 143 mmol/L (ref 134–144)
Total Protein: 6.4 g/dL (ref 6.0–8.5)

## 2017-01-04 NOTE — Telephone Encounter (Signed)
x

## 2017-01-06 ENCOUNTER — Encounter: Payer: Self-pay | Admitting: Family Medicine

## 2017-01-06 ENCOUNTER — Ambulatory Visit (INDEPENDENT_AMBULATORY_CARE_PROVIDER_SITE_OTHER): Payer: BLUE CROSS/BLUE SHIELD | Admitting: Family Medicine

## 2017-01-06 VITALS — BP 117/65 | HR 78 | Temp 97.4°F | Ht 62.0 in | Wt 125.1 lb

## 2017-01-06 DIAGNOSIS — E785 Hyperlipidemia, unspecified: Secondary | ICD-10-CM | POA: Diagnosis not present

## 2017-01-06 DIAGNOSIS — E119 Type 2 diabetes mellitus without complications: Secondary | ICD-10-CM | POA: Diagnosis not present

## 2017-01-06 MED ORDER — SIMVASTATIN 40 MG PO TABS: 40.0000 mg | ORAL_TABLET | Freq: Every day | ORAL | 1 refills | Status: DC

## 2017-01-06 MED ORDER — CANAGLIFLOZIN 100 MG PO TABS: 100.0000 mg | ORAL_TABLET | Freq: Every day | ORAL | 1 refills | Status: DC

## 2017-01-06 MED ORDER — METFORMIN HCL 1000 MG PO TABS: 1000.0000 mg | ORAL_TABLET | Freq: Two times a day (BID) | ORAL | 3 refills | Status: DC

## 2017-01-06 MED ORDER — GLIPIZIDE ER 5 MG PO TB24: 5.0000 mg | ORAL_TABLET | Freq: Every day | ORAL | 1 refills | Status: DC

## 2017-01-06 NOTE — Progress Notes (Signed)
BP 117/65   Pulse 78   Temp 97.4 F (36.3 C) (Oral)   Ht 5' 2"  (1.575 m)   Wt 125 lb 2 oz (56.8 kg)   BMI 22.89 kg/m    Subjective:    Patient ID: Misty [name], female    DOB: 1951/02/16, 66 y.o.   MRN: [id number]  HPI: Misty [name] is a 66 y.o. female presenting on 01/06/2017 for Diabetes (followup) and Hyperlipidemia   HPI Type 2 diabetes Patient is coming in today for recheck on her type 2 diabetes. She had labs done last week. Patient's hemoglobin A1c came back as 7.4 which is improved from the previous 7.8 but she's been hovering in the sevens and can't quite get it down. We discussed the possibility of needing to add a new medication. She says her blood sugars in the mornings run between 90 and 130. Patient denies headaches, blurred vision, chest pains, shortness of breath, or weakness. Denies any side effects from medication and is content with current medication. She is not currently on an ACE inhibitor. She is currently on a statin. She has not seen an ophthalmologist yet this year.  Hyperlipidemia Patient is coming in for cholesterol recheck. She is currently on simvastatin 40 mg. Her cholesterol check came back normal in November so we did not recheck it with his February recheck. She denies any myalgias or leg pains associated with medication. Her liver panel was normal.  Relevant past medical, surgical, family and social history reviewed and updated as indicated. Interim medical history since our last visit reviewed. Allergies and medications reviewed and updated.  Review of Systems  Constitutional: Negative for chills and fever.  Eyes: Negative for visual disturbance.  Respiratory: Negative for chest tightness and shortness of breath.   Cardiovascular: Negative for chest pain and leg swelling.  Genitourinary: Negative for difficulty urinating and dysuria.  Musculoskeletal: Negative for back pain and gait problem.  Skin: Negative for rash.  Neurological:  Negative for dizziness, weakness, light-headedness, numbness and headaches.  Psychiatric/Behavioral: Negative for agitation and behavioral problems.  All other systems reviewed and are negative.   Per HPI unless specifically indicated above   Allergies as of 01/06/2017      Reactions   Atorvastatin Other (See Comments)      Medication List       Accurate as of 01/06/17  4:57 PM. Always use your most recent med list.          canagliflozin 100 MG Tabs tablet Commonly known as:  INVOKANA Take 1 tablet (100 mg total) by mouth daily before breakfast.   glipiZIDE 5 MG 24 hr tablet Commonly known as:  GLUCOTROL XL Take 1 tablet (5 mg total) by mouth daily.   metFORMIN 1000 MG tablet Commonly known as:  GLUCOPHAGE Take 1 tablet (1,000 mg total) by mouth 2 (two) times daily with a meal.   simvastatin 40 MG tablet Commonly known as:  ZOCOR Take 1 tablet (40 mg total) by mouth daily.          Objective:    BP 117/65   Pulse 78   Temp 97.4 F (36.3 C) (Oral)   Ht 5' 2"  (1.575 m)   Wt 125 lb 2 oz (56.8 kg)   BMI 22.89 kg/m   Wt Readings from Last 3 Encounters:  01/06/17 125 lb 2 oz (56.8 kg)  10/05/16 128 lb 2 oz (58.1 kg)  06/22/16 126 lb 12.8 oz (57.5 kg)    Physical Exam  Constitutional: She is oriented to person, place, and time. She appears well-developed and well-nourished. No distress.  Eyes: Conjunctivae are normal.  Neck: Neck supple.  Cardiovascular: Normal rate, regular rhythm, normal heart sounds and intact distal pulses.   No murmur heard. Pulmonary/Chest: Effort normal and breath sounds normal. No respiratory distress. She has no wheezes. She has no rales.  Musculoskeletal: Normal range of motion. She exhibits no edema or tenderness.  Lymphadenopathy:    She has no cervical adenopathy.  Neurological: She is alert and oriented to person, place, and time. Coordination normal.  Skin: Skin is warm and dry. No rash noted. She is not diaphoretic.    Psychiatric: She has a normal mood and affect. Her behavior is normal.  Nursing note and vitals reviewed.   Results for orders placed or performed in visit on 12/29/16  CMP14+EGFR  Result Value Ref Range   Glucose 152 (H) 65 - 99 mg/dL   BUN 9 8 - 27 mg/dL   Creatinine, Ser 0.77 0.57 - 1.00 mg/dL   GFR calc non Af Amer 81 >59   GFR calc Af Amer 93 >59   BUN/Creatinine Ratio 12 12 - 28   Sodium 143 134 - 144 mmol/L   Potassium 4.8 3.5 - 5.2 mmol/L   Chloride 103 96 - 106 mmol/L   CO2 23 18 - 29 mmol/L   Calcium 9.0 8.7 - 10.3 mg/dL   Total Protein 6.4 6.0 - 8.5 g/dL   Albumin 4.2 3.6 - 4.8 g/dL   Globulin, Total 2.2 1.5 - 4.5   Albumin/Globulin Ratio 1.9 1.2 - 2.2   Bilirubin Total <0.2 0.0 - 1.2 mg/dL   Alkaline Phosphatase 70 39 - 117 IU/L   AST 26 0 - 40 IU/L   ALT 24 0 - 32 IU/L  Bayer DCA Hb A1c Waived  Result Value Ref Range   Bayer DCA Hb A1c Waived 7.4 (H) <7.0 %      Assessment & Plan:   Problem List Items Addressed This Visit      Endocrine   Type 2 diabetes mellitus (HCC) - Primary   Relevant Medications   canagliflozin (INVOKANA) 100 MG TABS tablet   glipiZIDE (GLUCOTROL XL) 5 MG 24 hr tablet   simvastatin (ZOCOR) 40 MG tablet   metFORMIN (GLUCOPHAGE) 1000 MG tablet     Other   Hyperlipidemia LDL goal <130   Relevant Medications   simvastatin (ZOCOR) 40 MG tablet       Follow up plan: Return in about 3 months (around 04/05/2017), or if symptoms worsen or fail to improve, for Recheck diabetes.  Counseling provided for all of the vaccine components No orders of the defined types were placed in this encounter.   Caryl Pina, MD [facility] 01/06/2017, 4:57 PM

## 2017-03-31 ENCOUNTER — Other Ambulatory Visit: Payer: Self-pay

## 2017-03-31 ENCOUNTER — Other Ambulatory Visit: Payer: BLUE CROSS/BLUE SHIELD

## 2017-03-31 DIAGNOSIS — E119 Type 2 diabetes mellitus without complications: Secondary | ICD-10-CM

## 2017-03-31 DIAGNOSIS — E785 Hyperlipidemia, unspecified: Secondary | ICD-10-CM

## 2017-03-31 LAB — CMP14+EGFR
ALBUMIN: 4.3 g/dL (ref 3.6–4.8)
ALK PHOS: 74 IU/L (ref 39–117)
ALT: 26 IU/L (ref 0–32)
AST: 22 IU/L (ref 0–40)
Albumin/Globulin Ratio: 2.2 (ref 1.2–2.2)
BUN / CREAT RATIO: 18 (ref 12–28)
BUN: 14 mg/dL (ref 8–27)
Bilirubin Total: 0.3 mg/dL (ref 0.0–1.2)
CO2: 23 mmol/L (ref 18–29)
CREATININE: 0.77 mg/dL (ref 0.57–1.00)
Calcium: 9.1 mg/dL (ref 8.7–10.3)
Chloride: 103 mmol/L (ref 96–106)
GFR calc non Af Amer: 81 mL/min/{1.73_m2} (ref >59)
GFR, EST AFRICAN AMERICAN: 93 mL/min/{1.73_m2} (ref >59)
GLOBULIN, TOTAL: 2 g/dL (ref 1.5–4.5)
Glucose: 120 mg/dL — ABNORMAL HIGH (ref 65–99)
Potassium: 5 mmol/L (ref 3.5–5.2)
SODIUM: 141 mmol/L (ref 134–144)
Total Protein: 6.3 g/dL (ref 6.0–8.5)

## 2017-03-31 LAB — LIPID PANEL
CHOLESTEROL TOTAL: 154 mg/dL (ref 100–199)
Chol/HDL Ratio: 2.3 ratio (ref 0.0–4.4)
HDL: 66 mg/dL (ref >39)
LDL Calculated: 63 mg/dL (ref 0–99)
Triglycerides: 127 mg/dL (ref 0–149)
VLDL CHOLESTEROL CAL: 25 mg/dL (ref 5–40)

## 2017-03-31 LAB — BAYER DCA HB A1C WAIVED: HB A1C (BAYER DCA - WAIVED): 7.1 % — ABNORMAL HIGH (ref <7.0)

## 2017-04-07 ENCOUNTER — Encounter: Payer: Self-pay | Admitting: Family Medicine

## 2017-04-07 ENCOUNTER — Ambulatory Visit (INDEPENDENT_AMBULATORY_CARE_PROVIDER_SITE_OTHER): Payer: BLUE CROSS/BLUE SHIELD | Admitting: Family Medicine

## 2017-04-07 ENCOUNTER — Ambulatory Visit (INDEPENDENT_AMBULATORY_CARE_PROVIDER_SITE_OTHER): Payer: BLUE CROSS/BLUE SHIELD

## 2017-04-07 VITALS — BP 127/73 | HR 75 | Temp 97.2°F | Ht 62.0 in | Wt 119.2 lb

## 2017-04-07 DIAGNOSIS — E119 Type 2 diabetes mellitus without complications: Secondary | ICD-10-CM | POA: Diagnosis not present

## 2017-04-07 DIAGNOSIS — E785 Hyperlipidemia, unspecified: Secondary | ICD-10-CM | POA: Diagnosis not present

## 2017-04-07 DIAGNOSIS — Z780 Asymptomatic menopausal state: Secondary | ICD-10-CM

## 2017-04-07 NOTE — Progress Notes (Signed)
BP 127/73   Pulse 75   Temp 97.2 F (36.2 C) (Oral)   Ht 5\' 2"  (1.575 m)   Wt 119 lb 3.2 oz (54.1 kg)   BMI 21.80 kg/m    Subjective:    Patient ID: Misty [name], female    DOB: Nov 06, 1951, 66 y.o.   MRN: [id number]  HPI: Misty [name] is a 66 y.o. female presenting on 04/07/2017 for Diabetes (3 month recheck)   HPI Type 2 diabetes mellitus Patient comes in today for recheck of his diabetes. Patient has been currently taking Metformin and glipizide. Patient is not currently on an ACE inhibitor. Patient has not seen an ophthalmologist this year. Patient denies any issues with his feet.   Hyperlipidemia Patient is coming in for recheck of his hyperlipidemia. He is currently taking simvastatin. He denies any issues with myalgias or history of liver damage from it. He denies any focal numbness or weakness or chest pain.   Postmenopausal DEXA scan Patient has never had a DEXA scan and a 66 year old and postmenopausal. We will get her a DEXA scan scheduled. She has never had any fractures that are unexplained either.  Relevant past medical, surgical, family and social history reviewed and updated as indicated. Interim medical history since our last visit reviewed. Allergies and medications reviewed and updated.  Review of Systems  Constitutional: Negative for chills and fever.  HENT: Negative for congestion, ear discharge and ear pain.   Eyes: Negative for redness and visual disturbance.  Respiratory: Negative for chest tightness and shortness of breath.   Cardiovascular: Negative for chest pain and leg swelling.  Genitourinary: Negative for difficulty urinating and dysuria.  Musculoskeletal: Negative for back pain and gait problem.  Skin: Negative for rash.  Neurological: Negative for dizziness, light-headedness and headaches.  Psychiatric/Behavioral: Negative for agitation and behavioral problems.  All other systems reviewed and are negative.   Per HPI unless  specifically indicated above       Objective:    BP 127/73   Pulse 75   Temp 97.2 F (36.2 C) (Oral)   Ht 5\' 2"  (1.575 m)   Wt 119 lb 3.2 oz (54.1 kg)   BMI 21.80 kg/m   Wt Readings from Last 3 Encounters:  04/07/17 119 lb 3.2 oz (54.1 kg)  01/06/17 125 lb 2 oz (56.8 kg)  10/05/16 128 lb 2 oz (58.1 kg)    Physical Exam  Constitutional: She is oriented to person, place, and time. She appears well-developed and well-nourished. No distress.  Eyes: Conjunctivae are normal.  Neck: Neck supple. No thyromegaly present.  Cardiovascular: Normal rate, regular rhythm, normal heart sounds and intact distal pulses.   No murmur heard. Pulmonary/Chest: Effort normal and breath sounds normal. No respiratory distress. She has no wheezes.  Musculoskeletal: Normal range of motion. She exhibits no edema or tenderness.  Lymphadenopathy:    She has no cervical adenopathy.  Neurological: She is alert and oriented to person, place, and time. Coordination normal.  Skin: Skin is warm and dry. No rash noted. She is not diaphoretic.  Psychiatric: She has a normal mood and affect. Her behavior is normal.  Nursing note and vitals reviewed.      Assessment & Plan:   Problem List Items Addressed This Visit      Endocrine   Type 2 diabetes mellitus ([place]) - Primary     Other   Hyperlipidemia LDL goal <130    Other Visit Diagnoses    Postmenopausal  Relevant Orders   DG [facility] DEXA (Completed)       Follow up plan: Return in about 3 months (around 07/08/2017), or if symptoms worsen or fail to improve, for Recheck diabetes and hyperlipidemia.  Counseling provided for all of the vaccine components Orders Placed This Encounter  Procedures  . DG Bone Density    Caryl Pina, MD [facility] 04/07/2017, 4:52 PM

## 2017-04-28 ENCOUNTER — Ambulatory Visit: Payer: BLUE CROSS/BLUE SHIELD | Admitting: Pharmacist

## 2017-05-18 ENCOUNTER — Telehealth: Payer: Self-pay | Admitting: Family Medicine

## 2017-05-18 DIAGNOSIS — Z1211 Encounter for screening for malignant neoplasm of colon: Secondary | ICD-10-CM

## 2017-05-18 NOTE — Telephone Encounter (Signed)
Referral ordered and called pt and left VM stating requested referral ordered and to call back with any further questions or concerns.

## 2017-05-26 ENCOUNTER — Telehealth: Payer: Self-pay

## 2017-05-26 NOTE — Telephone Encounter (Signed)
Pt left Vm she is ready to be triaged.  Can call her at [phone] or [phone].

## 2017-06-02 NOTE — Telephone Encounter (Signed)
LMOM to call.

## 2017-06-15 ENCOUNTER — Telehealth: Payer: Self-pay

## 2017-06-15 NOTE — Telephone Encounter (Signed)
See separate triage.  

## 2017-06-28 NOTE — Telephone Encounter (Signed)
Gastroenterology Pre-Procedure Review  Request Date: 06/15/2017 Requesting Physician: Caryl Pina, MD  PATIENT REVIEW QUESTIONS: The patient responded to the following health history questions as indicated:    1. Diabetes Melitis: YES 2. Joint replacements in the past 12 months: no 3. Major health problems in the past 3 months: no 4. Has an artificial valve or MVP: no 5. Has a defibrillator: no 6. Has been advised in past to take antibiotics in advance of a procedure like teeth cleaning: no 7. Family history of colon cancer: YES 8. Alcohol Use: no 9. History of sleep apnea: no  10. History of coronary artery or other vascular stents placed within the last 12 months: no 11. History of any prior anesthesia complications: no    MEDICATIONS & ALLERGIES:    Patient reports the following regarding taking any blood thinners:   Plavix? no Aspirin? no Coumadin? no Brilinta? no Xarelto? no Eliquis? no Pradaxa? no Savaysa? no Effient? no  Patient confirms/reports the following medications:  Current Outpatient Prescriptions  Medication Sig Dispense Refill  . glipiZIDE (GLUCOTROL XL) 5 MG 24 hr tablet Take 1 tablet (5 mg total) by mouth daily. (Patient taking differently: Take 5 mg by mouth at bedtime. ) 90 tablet 1  . metFORMIN (GLUCOPHAGE) 1000 MG tablet Take 1 tablet (1,000 mg total) by mouth 2 (two) times daily with a meal. (Patient taking differently: Take 1,000 mg by mouth at bedtime. ) 180 tablet 3  . simvastatin (ZOCOR) 40 MG tablet Take 1 tablet (40 mg total) by mouth daily. 90 tablet 1   No current facility-administered medications for this visit.     Patient confirms/reports the following allergies:  Allergies  Allergen Reactions  . Atorvastatin Other (See Comments)    No orders of the defined types were placed in this encounter.   AUTHORIZATION INFORMATION Primary Insurance:  ID #:  Group #:  Pre-Cert / Auth required: Pre-Cert / Auth #:   Secondary Insurance:    ID #:   Group #:  Pre-Cert / Auth required:  Pre-Cert / Auth #:   SCHEDULE INFORMATION: Procedure has been scheduled as follows:  Date: 08/06/2017                Time:  9:30 AM [facility] Short Stay  This Gastroenterology Pre-Precedure Review Form is being routed to the following provider(s): Barney Drain, MD

## 2017-06-29 NOTE — Telephone Encounter (Signed)
Appropriate. Hold evening dose of diabetes medications the day before.

## 2017-06-30 ENCOUNTER — Other Ambulatory Visit: Payer: Self-pay

## 2017-06-30 DIAGNOSIS — Z800 Family history of malignant neoplasm of digestive organs: Secondary | ICD-10-CM

## 2017-06-30 MED ORDER — PEG 3350-KCL-NA BICARB-NACL 420 G PO SOLR: 4000.0000 mL | ORAL | 0 refills | Status: DC

## 2017-06-30 NOTE — Telephone Encounter (Signed)
Rx sent to the pharmacy and split prep instructions mailed to pt.

## 2017-07-02 ENCOUNTER — Other Ambulatory Visit: Payer: PPO

## 2017-07-02 DIAGNOSIS — E119 Type 2 diabetes mellitus without complications: Secondary | ICD-10-CM

## 2017-07-02 DIAGNOSIS — E785 Hyperlipidemia, unspecified: Secondary | ICD-10-CM

## 2017-07-02 LAB — BAYER DCA HB A1C WAIVED: HB A1C: 7.5 % — AB (ref <7.0)

## 2017-07-03 LAB — CMP14+EGFR
ALT: 20 IU/L (ref 0–32)
AST: 25 IU/L (ref 0–40)
Albumin/Globulin Ratio: 1.9 (ref 1.2–2.2)
Albumin: 4.1 g/dL (ref 3.6–4.8)
Alkaline Phosphatase: 72 IU/L (ref 39–117)
BILIRUBIN TOTAL: 0.3 mg/dL (ref 0.0–1.2)
BUN/Creatinine Ratio: 17 (ref 12–28)
BUN: 14 mg/dL (ref 8–27)
CALCIUM: 9 mg/dL (ref 8.7–10.3)
CHLORIDE: 105 mmol/L (ref 96–106)
CO2: 25 mmol/L (ref 20–29)
Creatinine, Ser: 0.82 mg/dL (ref 0.57–1.00)
GFR calc non Af Amer: 74 mL/min/{1.73_m2} (ref >59)
GFR, EST AFRICAN AMERICAN: 86 mL/min/{1.73_m2} (ref >59)
GLUCOSE: 133 mg/dL — AB (ref 65–99)
Globulin, Total: 2.2 g/dL (ref 1.5–4.5)
Potassium: 5 mmol/L (ref 3.5–5.2)
Sodium: 142 mmol/L (ref 134–144)
TOTAL PROTEIN: 6.3 g/dL (ref 6.0–8.5)

## 2017-07-03 LAB — LIPID PANEL
Chol/HDL Ratio: 2.4 ratio (ref 0.0–4.4)
Cholesterol, Total: 177 mg/dL (ref 100–199)
HDL: 75 mg/dL (ref >39)
LDL Calculated: 74 mg/dL (ref 0–99)
Triglycerides: 141 mg/dL (ref 0–149)
VLDL CHOLESTEROL CAL: 28 mg/dL (ref 5–40)

## 2017-07-06 NOTE — Telephone Encounter (Signed)
PA# for TCS is [phone]

## 2017-07-09 ENCOUNTER — Ambulatory Visit (INDEPENDENT_AMBULATORY_CARE_PROVIDER_SITE_OTHER): Payer: PPO | Admitting: Family Medicine

## 2017-07-09 ENCOUNTER — Encounter: Payer: Self-pay | Admitting: Family Medicine

## 2017-07-09 VITALS — BP 130/86 | HR 87 | Temp 98.2°F | Ht 62.0 in | Wt 122.0 lb

## 2017-07-09 DIAGNOSIS — E119 Type 2 diabetes mellitus without complications: Secondary | ICD-10-CM

## 2017-07-09 DIAGNOSIS — E785 Hyperlipidemia, unspecified: Secondary | ICD-10-CM | POA: Diagnosis not present

## 2017-07-09 MED ORDER — SITAGLIPTIN PHOSPHATE 100 MG PO TABS: 100.0000 mg | ORAL_TABLET | Freq: Every day | ORAL | 3 refills | Status: DC

## 2017-07-09 NOTE — Progress Notes (Signed)
BP 130/86   Pulse 87   Temp 98.2 F (36.8 C) (Oral)   Ht _0  (1.575 m)   Wt 122 lb (55.3 kg)   BMI 22.31 kg/m    Subjective:    Patient ID: Misty [name], female    DOB: 1951-01-22, 66 y.o.   MRN: [id number]  HPI: Misty [name] is a 66 y.o. female presenting on 07/09/2017 for Diabetes and Hyperlipidemia   HPI Type 2 diabetes mellitus Patient comes in today for recheck of his diabetes. Patient has been currently taking Glipizide and metformin, her diabetes on the last check which was last week showed that her A1c is 7.5 which is an increase from where it was. Previously it was 7.1. She does admit that her diet has been slightly worse than what has been before. Patient is not currently on an ACE inhibitor/ARB. Patient has not seen an ophthalmologist this year. Patient denies any issues with their feet.   Hyperlipidemia Patient is coming in for recheck of his hyperlipidemia. The patient is currently taking simvastatin. They deny any issues with myalgias or history of liver damage from it. They deny any focal numbness or weakness or chest pain. Her cholesterol looked great on last check which is just a week ago  Relevant past medical, surgical, family and social history reviewed and updated as indicated. Interim medical history since our last visit reviewed. Allergies and medications reviewed and updated.  Review of Systems  Constitutional: Negative for chills and fever.  Eyes: Negative for visual disturbance.  Respiratory: Negative for chest tightness and shortness of breath.   Cardiovascular: Negative for chest pain and leg swelling.  Musculoskeletal: Negative for back pain and gait problem.  Skin: Negative for rash.  Neurological: Negative for dizziness, weakness, light-headedness, numbness and headaches.  Psychiatric/Behavioral: Negative for agitation and behavioral problems.  All other systems reviewed and are negative.   Per HPI unless specifically indicated  above     Objective:    BP 130/86   Pulse 87   Temp 98.2 F (36.8 C) (Oral)   Ht _1  (1.575 m)   Wt 122 lb (55.3 kg)   BMI 22.31 kg/m   Wt Readings from Last 3 Encounters:  07/09/17 122 lb (55.3 kg)  04/07/17 119 lb 3.2 oz (54.1 kg)  01/06/17 125 lb 2 oz (56.8 kg)    Physical Exam  Results for orders placed or performed in visit on 07/02/17  CMP14+EGFR  Result Value Ref Range   Glucose 133 (H) 65 - 99 mg/dL   BUN 14 8 - 27 mg/dL   Creatinine, Ser 0.82 0.57 - 1.00 mg/dL   GFR calc non Af Amer 74 >59 mL/min/1.73   GFR calc Af Amer 86 >59 mL/min/1.73   BUN/Creatinine Ratio 17 12 - 28   Sodium 142 134 - 144 mmol/L   Potassium 5.0 3.5 - 5.2 mmol/L   Chloride 105 96 - 106 mmol/L   CO2 25 20 - 29 mmol/L   Calcium 9.0 8.7 - 10.3 mg/dL   Total Protein 6.3 6.0 - 8.5 g/dL   Albumin 4.1 3.6 - 4.8 g/dL   Globulin, Total 2.2 1.5 - 4.5 g/dL   Albumin/Globulin Ratio 1.9 1.2 - 2.2   Bilirubin Total 0.3 0.0 - 1.2 mg/dL   Alkaline Phosphatase 72 39 - 117 IU/L   AST 25 0 - 40 IU/L   ALT 20 0 - 32 IU/L  Lipid panel  Result Value Ref Range   Cholesterol, Total  177 100 - 199 mg/dL   Triglycerides 141 0 - 149 mg/dL   HDL 75 >39 mg/dL   VLDL Cholesterol Cal 28 5 - 40 mg/dL   LDL Calculated 74 0 - 99 mg/dL   Chol/HDL Ratio 2.4 0.0 - 4.4 ratio  Bayer DCA Hb A1c Waived  Result Value Ref Range   Bayer DCA Hb A1c Waived 7.5 (H) <7.0 %      Assessment & Plan:   Problem List Items Addressed This Visit      Endocrine   Type 2 diabetes mellitus ([place]) - Primary   Relevant Medications   sitaGLIPtin (JANUVIA) 100 MG tablet     Other   Hyperlipidemia LDL goal <130     Continue current medications except added Januvia   Follow up plan: Return in about 3 months (around 10/08/2017), or if symptoms worsen or fail to improve, for Recheck diabetes.  Counseling provided for all of the vaccine components No orders of the defined types were placed in this encounter.   Caryl Pina, MD [facility] 07/09/2017, 5:11 PM

## 2017-07-10 DIAGNOSIS — M25571 Pain in right ankle and joints of right foot: Secondary | ICD-10-CM | POA: Diagnosis not present

## 2017-07-10 DIAGNOSIS — S93401A Sprain of unspecified ligament of right ankle, initial encounter: Secondary | ICD-10-CM | POA: Diagnosis not present

## 2017-07-30 DIAGNOSIS — D1039 Benign neoplasm of other parts of mouth: Secondary | ICD-10-CM | POA: Diagnosis not present

## 2017-07-31 ENCOUNTER — Other Ambulatory Visit: Payer: Self-pay | Admitting: Family Medicine

## 2017-07-31 DIAGNOSIS — E119 Type 2 diabetes mellitus without complications: Secondary | ICD-10-CM

## 2017-07-31 DIAGNOSIS — E785 Hyperlipidemia, unspecified: Secondary | ICD-10-CM

## 2017-08-06 ENCOUNTER — Encounter (HOSPITAL_COMMUNITY)
Admission: RE | Disposition: A | Discharge status: Home / Self Care | Payer: Self-pay | Source: Ambulatory Visit | Attending: Gastroenterology

## 2017-08-06 ENCOUNTER — Encounter (HOSPITAL_COMMUNITY): Payer: Self-pay | Admitting: *Deleted

## 2017-08-06 ENCOUNTER — Ambulatory Visit (HOSPITAL_COMMUNITY)
Admission: RE | Admit: 2017-08-06 | Discharge: 2017-08-06 | Disposition: A | Discharge status: Home / Self Care | Payer: PPO | Source: Ambulatory Visit | Attending: Gastroenterology | Admitting: Gastroenterology

## 2017-08-06 DIAGNOSIS — Z1211 Encounter for screening for malignant neoplasm of colon: Secondary | ICD-10-CM | POA: Diagnosis not present

## 2017-08-06 DIAGNOSIS — K648 Other hemorrhoids: Secondary | ICD-10-CM | POA: Diagnosis not present

## 2017-08-06 DIAGNOSIS — Z8249 Family history of ischemic heart disease and other diseases of the circulatory system: Secondary | ICD-10-CM | POA: Insufficient documentation

## 2017-08-06 DIAGNOSIS — Z7984 Long term (current) use of oral hypoglycemic drugs: Secondary | ICD-10-CM | POA: Insufficient documentation

## 2017-08-06 DIAGNOSIS — D1039 Benign neoplasm of other parts of mouth: Secondary | ICD-10-CM | POA: Diagnosis not present

## 2017-08-06 DIAGNOSIS — Q438 Other specified congenital malformations of intestine: Secondary | ICD-10-CM | POA: Insufficient documentation

## 2017-08-06 DIAGNOSIS — K5730 Diverticulosis of large intestine without perforation or abscess without bleeding: Secondary | ICD-10-CM | POA: Insufficient documentation

## 2017-08-06 DIAGNOSIS — K644 Residual hemorrhoidal skin tags: Secondary | ICD-10-CM | POA: Insufficient documentation

## 2017-08-06 DIAGNOSIS — Z86010 Personal history of colonic polyps: Secondary | ICD-10-CM | POA: Diagnosis not present

## 2017-08-06 DIAGNOSIS — E785 Hyperlipidemia, unspecified: Secondary | ICD-10-CM | POA: Insufficient documentation

## 2017-08-06 DIAGNOSIS — E119 Type 2 diabetes mellitus without complications: Secondary | ICD-10-CM | POA: Insufficient documentation

## 2017-08-06 DIAGNOSIS — D122 Benign neoplasm of ascending colon: Secondary | ICD-10-CM

## 2017-08-06 DIAGNOSIS — Z823 Family history of stroke: Secondary | ICD-10-CM | POA: Diagnosis not present

## 2017-08-06 DIAGNOSIS — Z800 Family history of malignant neoplasm of digestive organs: Secondary | ICD-10-CM | POA: Diagnosis not present

## 2017-08-06 DIAGNOSIS — Z79899 Other long term (current) drug therapy: Secondary | ICD-10-CM | POA: Insufficient documentation

## 2017-08-06 HISTORY — PX: COLONOSCOPY: SHX5424

## 2017-08-06 HISTORY — DX: Polyp of colon: K63.5

## 2017-08-06 HISTORY — DX: Other seasonal allergic rhinitis: J30.2

## 2017-08-06 LAB — GLUCOSE, CAPILLARY: Glucose-Capillary: 128 mg/dL — ABNORMAL HIGH (ref 65–99)

## 2017-08-06 SURGERY — COLONOSCOPY
Anesthesia: Moderate Sedation

## 2017-08-06 MED ORDER — MEPERIDINE HCL 100 MG/ML IJ SOLN
INTRAMUSCULAR | Status: DC | PRN
  Administered 2017-08-06: 50 mg via INTRAVENOUS

## 2017-08-06 MED ORDER — MIDAZOLAM HCL 5 MG/5ML IJ SOLN
INTRAMUSCULAR | Status: DC | PRN
  Administered 2017-08-06: 1 mg via INTRAVENOUS
  Administered 2017-08-06: 2 mg via INTRAVENOUS

## 2017-08-06 MED ORDER — STERILE WATER FOR IRRIGATION IR SOLN
Status: DC | PRN
  Administered 2017-08-06: 10:00:00

## 2017-08-06 MED ORDER — SODIUM CHLORIDE 0.9 % IV SOLN
INTRAVENOUS | Status: DC
  Administered 2017-08-06: 09:00:00 via INTRAVENOUS

## 2017-08-06 MED ORDER — MIDAZOLAM HCL 5 MG/5ML IJ SOLN
INTRAMUSCULAR | Status: AC
  Filled 2017-08-06: qty 10

## 2017-08-06 MED ORDER — MEPERIDINE HCL 100 MG/ML IJ SOLN
INTRAMUSCULAR | Status: AC
  Filled 2017-08-06: qty 2

## 2017-08-06 MED ORDER — MIDAZOLAM HCL 5 MG/5ML IJ SOLN
INTRAMUSCULAR | Status: DC | PRN
  Administered 2017-08-06: 1 mg via INTRAVENOUS

## 2017-08-06 NOTE — Discharge Instructions (Signed)
YOU DID NOT HAVE ANY POLYPS.  You have MODERATE internal and external hemorrhoids and diverticulosis in your left colon. It took me twice as long to withdraw the scope due to retained liquid stool in the colon but you had a good exam.   DRINK WATER TO KEEP YOUR URINE LIGHT YELLOW.  FOLLOW A HIGH FIBER DIET. AVOID ITEMS THAT CAUSE BLOATING & GAS. See info below.  Next colonoscopy in 5 years. Colonoscopy Care After Read the instructions outlined below and refer to this sheet in the next week. These discharge instructions provide you with general information on caring for yourself after you leave the hospital. While your treatment has been planned according to the most current medical practices available, unavoidable complications occasionally occur. If you have any problems or questions after discharge, call DR. Adean Milosevic, [phone].  ACTIVITY  You may resume your regular activity, but move at a slower pace for the next 24 hours.   Take frequent rest periods for the next 24 hours.   Walking will help get rid of the air and reduce the bloated feeling in your belly (abdomen).   No driving for 24 hours (because of the medicine (anesthesia) used during the test).   You may shower.   Do not sign any important legal documents or operate any machinery for 24 hours (because of the anesthesia used during the test).    NUTRITION  Drink plenty of fluids.   You may resume your normal diet as instructed by your doctor.   Begin with a light meal and progress to your normal diet. Heavy or fried foods are harder to digest and may make you feel sick to your stomach (nauseated).   Avoid alcoholic beverages for 24 hours or as instructed.    MEDICATIONS  You may resume your normal medications.   WHAT YOU CAN EXPECT TODAY  Some feelings of bloating in the abdomen.   Passage of more gas than usual.   Spotting of blood in your stool or on the toilet paper  .  IF YOU HAD POLYPS REMOVED  DURING THE COLONOSCOPY:  Eat a soft diet IF YOU HAVE NAUSEA, BLOATING, ABDOMINAL PAIN, OR VOMITING.    FINDING OUT THE RESULTS OF YOUR TEST Not all test results are available during your visit. DR. Oneida Alar WILL CALL YOU WITHIN 7 DAYS OF YOUR PROCEDUE WITH YOUR RESULTS. Do not assume everything is normal if you have not heard from DR. Phong Isenberg IN ONE WEEK, CALL HER OFFICE AT [phone].  SEEK IMMEDIATE MEDICAL ATTENTION AND CALL THE OFFICE: [phone] IF:  You have more than a spotting of blood in your stool.   Your belly is swollen (abdominal distention).   You are nauseated or vomiting.   You have a temperature over 101F.   You have abdominal pain or discomfort that is severe or gets worse throughout the day.  High-Fiber Diet A high-fiber diet changes your normal diet to include more whole grains, legumes, fruits, and vegetables. Changes in the diet involve replacing refined carbohydrates with unrefined foods. The calorie level of the diet is essentially unchanged. The Dietary Reference Intake (recommended amount) for adult males is 38 grams per day. For adult females, it is 25 grams per day. Pregnant and lactating women should consume 28 grams of fiber per day. Fiber is the intact part of a plant that is not broken down during digestion. Functional fiber is fiber that has been isolated from the plant to provide a beneficial effect in the body. PURPOSE  Increase stool bulk.   Ease and regulate bowel movements.   Lower cholesterol.   REDUCE RISK OF COLON CANCER  INDICATIONS THAT YOU NEED MORE FIBER  Constipation and hemorrhoids.   Uncomplicated diverticulosis (intestine condition) and irritable bowel syndrome.   Weight management.   As a protective measure against hardening of the arteries (atherosclerosis), diabetes, and cancer.   GUIDELINES FOR INCREASING FIBER IN THE DIET  Start adding fiber to the diet slowly. A gradual increase of about 5 more grams (2 slices of  whole-wheat bread, 2 servings of most fruits or vegetables, or 1 bowl of high-fiber cereal) per day is best. Too rapid an increase in fiber may result in constipation, flatulence, and bloating.   Drink enough water and fluids to keep your urine clear or pale yellow. Water, juice, or caffeine-free drinks are recommended. Not drinking enough fluid may cause constipation.   Eat a variety of high-fiber foods rather than one type of fiber.   Try to increase your intake of fiber through using high-fiber foods rather than fiber pills or supplements that contain small amounts of fiber.   The goal is to change the types of food eaten. Do not supplement your present diet with high-fiber foods, but replace foods in your present diet.   INCLUDE A VARIETY OF FIBER SOURCES  Replace refined and processed grains with whole grains, canned fruits with fresh fruits, and incorporate other fiber sources. White rice, white breads, and most bakery goods contain little or no fiber.   Brown whole-grain rice, buckwheat oats, and many fruits and vegetables are all good sources of fiber. These include: broccoli, Brussels sprouts, cabbage, cauliflower, beets, sweet potatoes, white potatoes (skin on), carrots, tomatoes, eggplant, squash, berries, fresh fruits, and dried fruits.   Cereals appear to be the richest source of fiber. Cereal fiber is found in whole grains and bran. Bran is the fiber-rich outer coat of cereal grain, which is largely removed in refining. In whole-grain cereals, the bran remains. In breakfast cereals, the largest amount of fiber is found in those with "bran" in their names. The fiber content is sometimes indicated on the label.   You may need to include additional fruits and vegetables each day.   In baking, for 1 cup white flour, you may use the following substitutions:   1 cup whole-wheat flour minus 2 tablespoons.   1/2 cup white flour plus 1/2 cup whole-wheat flour.    Diverticulosis Diverticulosis is a common condition that develops when small pouches (diverticula) form in the wall of the colon. The risk of diverticulosis increases with age. It happens more often in people who eat a low-fiber diet. Most individuals with diverticulosis have no symptoms. Those individuals with symptoms usually experience belly (abdominal) pain, constipation, or loose stools (diarrhea).  HOME CARE INSTRUCTIONS  Increase the amount of fiber in your diet as directed by your caregiver or dietician. This may reduce symptoms of diverticulosis.   Drink at least 6 to 8 glasses of water each day to prevent constipation.   Try not to strain when you have a bowel movement.   Avoiding nuts and seeds to prevent complications is NOT NECESSARY.      FOODS HAVING HIGH FIBER CONTENT INCLUDE:  Fruits. Apple, peach, pear, tangerine, raisins, prunes.   Vegetables. Brussels sprouts, asparagus, broccoli, cabbage, carrot, cauliflower, romaine lettuce, spinach, summer squash, tomato, winter squash, zucchini.   Starchy Vegetables. Baked beans, kidney beans, lima beans, split peas, lentils, potatoes (with skin).   Grains. Whole  wheat bread, brown rice, bran flake cereal, plain oatmeal, white rice, shredded wheat, bran muffins.    SEEK IMMEDIATE MEDICAL CARE IF:  You develop increasing pain or severe bloating.   You have an oral temperature above 101F.   You develop vomiting or bowel movements that are bloody or black.   Hemorrhoids Hemorrhoids are dilated (enlarged) veins around the rectum. Sometimes clots will form in the veins. This makes them swollen and painful. These are called thrombosed hemorrhoids. Causes of hemorrhoids include:  Constipation.   Straining to have a bowel movement.   HEAVY LIFTING  HOME CARE INSTRUCTIONS  Eat a well balanced diet and drink 6 to 8 glasses of water every day to avoid constipation. You may also use a bulk laxative.   Avoid straining to  have bowel movements.   Keep anal area dry and clean.   Do not use a donut shaped pillow or sit on the toilet for long periods. This increases blood pooling and pain.   Move your bowels when your body has the urge; this will require less straining and will decrease pain and pressure.

## 2017-08-06 NOTE — Op Note (Signed)
[facility] Patient Name: Misty [name] Procedure Date: 08/06/2017 9:39 AM MRN: [id number] Date of Birth: Mar 13, 1951 Attending MD: Barney Drain MD, MD CSN: [id number] Age: 66 Admit Type: Outpatient Procedure:                Colonoscopy, SCREENING Indications:              High risk colon cancer surveillance: Personal                            history of colonic polyps Providers:                Barney Drain MD, MD, Lurline Del, RN, Purcell Nails.                            Bradshaw, Merchant navy officer Referring MD:             Fransisca Kaufmann. Dettinger Medicines:                Meperidine 50 mg IV, Midazolam 6 mg IV Complications:            No immediate complications. Estimated Blood Loss:     Estimated blood loss: none. Procedure:                Pre-Anesthesia Assessment:                           - Prior to the procedure, a History and Physical                            was performed, and patient medications and                            allergies were reviewed. The patient's tolerance of                            previous anesthesia was also reviewed. The risks                            and benefits of the procedure and the sedation                            options and risks were discussed with the patient.                            All questions were answered, and informed consent                            was obtained. Prior Anticoagulants: The patient has                            taken no previous anticoagulant or antiplatelet                            agents. ASA Grade Assessment: II - A patient with  mild systemic disease. After reviewing the risks                            and benefits, the patient was deemed in                            satisfactory condition to undergo the procedure.                            After obtaining informed consent, the colonoscope                            was passed under direct vision. Throughout the            procedure, the patient's blood pressure, pulse, and                            oxygen saturations were monitored continuously. The                            EC-[id number] ([id number]) scope was introduced through                            the anus and advanced to the the cecum, identified                            by appendiceal orifice and ileocecal valve. The                            colonoscopy was technically difficult and complex                            due to poor endoscopic visualization and a tortuous                            colon. Successful completion of the procedure was                            aided by increasing the dose of sedation                            medication, straightening and shortening the scope                            to obtain bowel loop reduction, lavage and                            COLOWRAP. The patient tolerated the procedure                            fairly well. The quality of the bowel preparation                            was GOOD WITH LAVAGE. The ileocecal valve,  appendiceal orifice, and rectum were photographed. Scope In: 9:50:38 AM Scope Out: 10:16:25 AM Scope Withdrawal Time: 0 hours 21 minutes 31 seconds  Total Procedure Duration: 0 hours 25 minutes 47 seconds  Findings:      The recto-sigmoid colon, sigmoid colon and descending colon were       moderately redundant.      Multiple small and large-mouthed diverticula were found in the       recto-sigmoid colon and sigmoid colon.      External and internal hemorrhoids were found during retroflexion. The       hemorrhoids were moderate. Impression:               - Redundant colon.                           - Diverticulosis in the recto-sigmoid colon and in                            the sigmoid colon.                           - External and internal hemorrhoids. Moderate Sedation:      Moderate (conscious) sedation was administered by the endoscopy  nurse       and supervised by the endoscopist. The following parameters were       monitored: oxygen saturation, heart rate, blood pressure, and response       to care. Total physician intraservice time was 36 minutes. Recommendation:           - Repeat colonoscopy in 5 years for surveillance.                           - High fiber diet.                           - Continue present medications.                           - Patient has a contact number available for                            emergencies. The signs and symptoms of potential                            delayed complications were discussed with the                            patient. Return to normal activities tomorrow.                            Written discharge instructions were provided to the                            patient. Procedure Code(s):        --- Professional ---                           [phone], Colonoscopy, flexible; diagnostic, including  collection of specimen(s) by brushing or washing,                            when performed (separate procedure)                           99152, Moderate sedation services provided by the                            same physician or other qualified health care                            professional performing the diagnostic or                            therapeutic service that the sedation supports,                            requiring the presence of an independent trained                            observer to assist in the monitoring of the                            patient's level of consciousness and physiological                            status; initial 15 minutes of intraservice time,                            patient age 72 years or older                           [phone], Moderate sedation services; each additional                            15 minutes intraservice time Diagnosis Code(s):        --- Professional ---                            Z86.010, Personal history of colonic polyps                           K64.8, Other hemorrhoids                           K57.30, Diverticulosis of large intestine without                            perforation or abscess without bleeding                           Q43.8, Other specified congenital malformations of                            intestine  CPT copyright 2016 American Medical Association. All rights reserved. The codes documented in this report are preliminary and upon coder review may  be revised to meet current compliance requirements. Barney Drain, MD Barney Drain MD, MD 08/06/2017 10:50:45 AM This report has been signed electronically. Number of Addenda: 0

## 2017-08-06 NOTE — H&P (Addendum)
  Primary Care Physician:  Dettinger, Fransisca Kaufmann, MD Primary Gastroenterologist:  Dr. Oneida Alar  Pre-Procedure History & Physical: HPI:  Misty [name] is a 66 y.o. female here for  PERSONAL HISTORY OF POLYPS/MOTHER DIED COLON CANCER AGE 71.   Past Medical History:  Diagnosis Date  . Colon polyps   . Diabetes mellitus without complication ([place])   . Hyperlipidemia     Past Surgical History:  Procedure Laterality Date  . CESAREAN SECTION      Prior to Admission medications   Medication Sig Start Date End Date Taking? Authorizing Provider  glipiZIDE (GLUCOTROL XL) 5 MG 24 hr tablet TAKE ONE TABLET BY MOUTH DAIL Y Patient taking differently: TAKE ONE TABLET BY MOUTH IN THE EVENING 08/02/17  Yes Dettinger, Fransisca Kaufmann, MD  Multiple Vitamins-Minerals (VISION FORMULA 2 PO) Take 1 capsule by mouth at bedtime.   Yes [provider]  simvastatin (ZOCOR) 40 MG tablet TAKE ONE TABLET BY MOUTH DAILY 08/02/17  Yes Dettinger, Fransisca Kaufmann, MD    Allergies as of 06/30/2017 - Review Complete 06/15/2017  Allergen Reaction Noted  . Atorvastatin Other (See Comments) 12/02/2015    Family History  Problem Relation Age of Onset  . Cancer Mother        INTESTINAL  . Stroke Father   . Cancer Sister        BREAST  . Heart disease Brother   . Heart disease Son     Social History   Social History  . Marital status: Married    Spouse name: N/A  . Number of children: N/A  . Years of education: N/A   Occupational History  . Not on file.   Social History Main Topics  . Smoking status: Never Smoker  . Smokeless tobacco: Never Used  . Alcohol use No  . Drug use: No  . Sexual activity: Not on file   Other Topics Concern  . Not on file   Social History Narrative  . No narrative on file    Review of Systems: See HPI, otherwise negative ROS   Physical Exam: There were no vitals taken for this visit. General:   Alert,  pleasant and cooperative in NAD Head:  Normocephalic and  atraumatic. Neck:  Supple; Lungs:  Clear throughout to auscultation.    Heart:  Regular rate and rhythm. Abdomen:  Soft, nontender and nondistended. Normal bowel sounds, without guarding, and without rebound.   Neurologic:  Alert and  oriented x4;  grossly normal neurologically.  Impression/Plan:   PERSONAL HISTORY OF POLYPS  Plan:  1. TCS TODAY DISCUSSED PROCEDURE, BENEFITS, & RISKS: < 1% chance of medication reaction, bleeding, perforation, or rupture of spleen/liver.

## 2017-08-10 ENCOUNTER — Encounter (HOSPITAL_COMMUNITY): Payer: Self-pay | Admitting: Gastroenterology

## 2017-08-18 DIAGNOSIS — Z01419 Encounter for gynecological examination (general) (routine) without abnormal findings: Secondary | ICD-10-CM | POA: Diagnosis not present

## 2017-08-18 DIAGNOSIS — Z1289 Encounter for screening for malignant neoplasm of other sites: Secondary | ICD-10-CM | POA: Diagnosis not present

## 2017-08-18 DIAGNOSIS — Z124 Encounter for screening for malignant neoplasm of cervix: Secondary | ICD-10-CM | POA: Diagnosis not present

## 2017-09-06 ENCOUNTER — Other Ambulatory Visit: Payer: Self-pay | Admitting: Family Medicine

## 2017-09-06 MED ORDER — BLOOD GLUCOSE MONITOR KIT: PACK | 0 refills | Status: AC

## 2017-09-07 NOTE — Telephone Encounter (Signed)
Prescription for meter and supplies placed at front for pick up, patient aware

## 2017-09-28 ENCOUNTER — Other Ambulatory Visit: Payer: Self-pay | Admitting: Family Medicine

## 2017-10-04 ENCOUNTER — Other Ambulatory Visit: Payer: Self-pay | Admitting: Family Medicine

## 2017-10-04 DIAGNOSIS — E785 Hyperlipidemia, unspecified: Secondary | ICD-10-CM

## 2017-10-04 DIAGNOSIS — E119 Type 2 diabetes mellitus without complications: Secondary | ICD-10-CM

## 2017-10-04 NOTE — Telephone Encounter (Signed)
OV 10/13/17

## 2017-10-05 ENCOUNTER — Other Ambulatory Visit: Payer: PPO

## 2017-10-05 DIAGNOSIS — E785 Hyperlipidemia, unspecified: Secondary | ICD-10-CM

## 2017-10-05 DIAGNOSIS — E119 Type 2 diabetes mellitus without complications: Secondary | ICD-10-CM | POA: Diagnosis not present

## 2017-10-05 LAB — BAYER DCA HB A1C WAIVED: HB A1C: 7 % — AB (ref <7.0)

## 2017-10-06 LAB — CMP14+EGFR
A/G RATIO: 2 (ref 1.2–2.2)
ALT: 19 IU/L (ref 0–32)
AST: 22 IU/L (ref 0–40)
Albumin: 4.6 g/dL (ref 3.6–4.8)
Alkaline Phosphatase: 80 IU/L (ref 39–117)
BUN/Creatinine Ratio: 22 (ref 12–28)
BUN: 23 mg/dL (ref 8–27)
Bilirubin Total: 0.4 mg/dL (ref 0.0–1.2)
CALCIUM: 9.8 mg/dL (ref 8.7–10.3)
CO2: 24 mmol/L (ref 20–29)
CREATININE: 1.06 mg/dL — AB (ref 0.57–1.00)
Chloride: 101 mmol/L (ref 96–106)
GFR, EST AFRICAN AMERICAN: 63 mL/min/{1.73_m2} (ref >59)
GFR, EST NON AFRICAN AMERICAN: 54 mL/min/{1.73_m2} — AB (ref >59)
GLUCOSE: 161 mg/dL — AB (ref 65–99)
Globulin, Total: 2.3 g/dL (ref 1.5–4.5)
POTASSIUM: 5.3 mmol/L — AB (ref 3.5–5.2)
Sodium: 144 mmol/L (ref 134–144)
TOTAL PROTEIN: 6.9 g/dL (ref 6.0–8.5)

## 2017-10-06 LAB — LIPID PANEL
CHOL/HDL RATIO: 2.6 ratio (ref 0.0–4.4)
Cholesterol, Total: 175 mg/dL (ref 100–199)
HDL: 67 mg/dL (ref >39)
LDL Calculated: 72 mg/dL (ref 0–99)
TRIGLYCERIDES: 181 mg/dL — AB (ref 0–149)
VLDL CHOLESTEROL CAL: 36 mg/dL (ref 5–40)

## 2017-10-06 LAB — MICROALBUMIN / CREATININE URINE RATIO
CREATININE, UR: 127.2 mg/dL
Microalb/Creat Ratio: 21.8 mg/g creat (ref 0.0–30.0)
Microalbumin, Urine: 27.7 ug/mL

## 2017-10-13 ENCOUNTER — Encounter: Payer: Self-pay | Admitting: Family Medicine

## 2017-10-13 ENCOUNTER — Ambulatory Visit: Payer: PPO | Admitting: Family Medicine

## 2017-10-13 VITALS — BP 126/81 | HR 83 | Temp 97.3°F | Ht 62.0 in | Wt 122.0 lb

## 2017-10-13 DIAGNOSIS — S134XXA Sprain of ligaments of cervical spine, initial encounter: Secondary | ICD-10-CM | POA: Diagnosis not present

## 2017-10-13 DIAGNOSIS — E1169 Type 2 diabetes mellitus with other specified complication: Secondary | ICD-10-CM | POA: Diagnosis not present

## 2017-10-13 DIAGNOSIS — E785 Hyperlipidemia, unspecified: Secondary | ICD-10-CM

## 2017-10-13 DIAGNOSIS — E119 Type 2 diabetes mellitus without complications: Secondary | ICD-10-CM | POA: Diagnosis not present

## 2017-10-13 MED ORDER — SIMVASTATIN 40 MG PO TABS: 40.0000 mg | ORAL_TABLET | Freq: Every day | ORAL | 0 refills | Status: DC

## 2017-10-13 MED ORDER — GLIPIZIDE ER 5 MG PO TB24: ORAL_TABLET | ORAL | 0 refills | Status: DC

## 2017-10-13 MED ORDER — BACLOFEN 10 MG PO TABS: 10.0000 mg | ORAL_TABLET | Freq: Three times a day (TID) | ORAL | 0 refills | Status: DC
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
## 2017-10-20 ENCOUNTER — Other Ambulatory Visit: Payer: Self-pay | Admitting: Family Medicine

## 2017-11-18 ENCOUNTER — Ambulatory Visit: Payer: PPO | Admitting: *Deleted

## 2017-11-30 ENCOUNTER — Other Ambulatory Visit: Payer: Self-pay

## 2017-11-30 ENCOUNTER — Ambulatory Visit (INDEPENDENT_AMBULATORY_CARE_PROVIDER_SITE_OTHER): Payer: PPO

## 2017-11-30 VITALS — BP 136/87 | HR 74 | Temp 97.1°F | Ht 62.0 in | Wt 117.2 lb

## 2017-11-30 DIAGNOSIS — Z0000 Encounter for general adult medical examination without abnormal findings: Secondary | ICD-10-CM

## 2017-11-30 NOTE — Progress Notes (Signed)
Subjective:   ISSABELLE MCRANEY is a 67 y.o. female who presents for an Initial Medicare Annual Wellness Visit.  Review of Systems    Mrs. Mcduffee is here today for her initial Medicare annual wellness visit.  She is a very active 67 year old who still is working.  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  She reports she enjoys her job and has no plans to retire any time soon.  She exercises regularly both at work and at home.  She spends a lot of time walking at her job and when home enjoys taking walks and going to the park with her grandchilfdren.  She lives at home with her husband of 73 years.  They have 3 children, two boys and one girl, along with 10 grandchildren.  Her children all live close by in [place].  She and her husband have a dog at home they have had for 20 years.  When she is not with her family, she enjoys spending time with friends and former co-workers, going out to eat, shopping or to movies.  Most days she reports she eats three meals.  Breakfast normally consists of oatmeal and a piece of toast, lunch is usually some type of microwave steamer meal or vegetables, at dinner they usually some type of meat and one or two vegetables.      Cardiac Risk Factors include: advanced age (>58mn, >>42women);diabetes mellitus;dyslipidemia     Objective:    Today's Vitals   11/30/17 0943 11/30/17 1337  BP: 136/87   Pulse: 74   Temp: (!) 97.1 F (36.2 C)   TempSrc: Oral   Weight: 117 lb 4 oz (53.2 kg)   Height: 5' 2" (1.575 m)   PainSc:  2    Body mass index is 21.45 kg/m.  Advanced Directives 11/30/2017 08/06/2017  Does Patient Have a Medical Advance Directive? Yes Yes  Type of Advance Directive Healthcare [place] Does patient want to make changes to medical advance directive? No - Patient declined -  [place] of  [place]? No - copy requested No - copy requested    Current Medications (verified) Outpatient Encounter Medications as of 11/30/2017  Medication Sig  . blood glucose meter kit and supplies KIT Dispense based on patient and insurance preference. Use up to four times daily as directed. (FOR ICD-9 250.00, 250.01).  .Marland KitchenglipiZIDE (GLUCOTROL XL) 5 MG 24 hr tablet TAKE ONE TABLET BY MOUTH DAIL Y  . metFORMIN (GLUCOPHAGE) 1000 MG tablet Take 1,000 mg by mouth daily with breakfast.  . Multiple Vitamins-Minerals (VISION FORMULA 2 PO) Take 1 capsule by mouth at bedtime.  . ONE TOUCH ULTRA TEST test strip USE 4 TIMES DAILY AS DRECTED  . simvastatin (ZOCOR) 40 MG tablet Take 1 tablet (40 mg total) by mouth daily.  . [DISCONTINUED] baclofen (LIORESAL) 10 MG tablet Take 1 tablet (10 mg total) by mouth 3 (three) times daily.   No facility-administered encounter medications on file as of 11/30/2017.     Allergies (verified) Atorvastatin   History: Past Medical History:  Diagnosis Date  . Colon polyps   . Diabetes mellitus without complication ([place]   . Fractured sternum   . Hyperlipidemia   . Seasonal allergies   . Whiplash    Past Surgical History:  Procedure Laterality Date  .  CESAREAN SECTION    . COLONOSCOPY    . COLONOSCOPY N/A 08/06/2017   Procedure: COLONOSCOPY;  Surgeon: Danie Binder, MD;  Location: [facility];  Service: Endoscopy;  Laterality: N/A;  9:30 Am   Family History  Problem Relation Age of Onset  . Cancer Mother        INTESTINAL  . Colon cancer Mother   . Stroke Father   . Cancer Sister        BREAST  . Heart disease Brother   . Heart disease Son    Social History   Socioeconomic History  . Marital status: Married    Spouse name: None  . Number of children: None  . Years of education: None  . Highest education level: None  Social Needs  . Financial resource strain: None  . Food insecurity - worry: None  . Food insecurity -  inability: None  . Transportation needs - medical: No  . Transportation needs - non-medical: No  Occupational History  . None  Tobacco Use  . Smoking status: Never Smoker  . Smokeless tobacco: Never Used  Substance and Sexual Activity  . Alcohol use: No  . Drug use: No  . Sexual activity: None  Other Topics Concern  . None  Social History Narrative  . None    Tobacco Counseling:  Patient does not smoke, and has never been a smoker.  Clinical Intake:   Pain : 0-10 Pain Score: 2  Pain Type: Chronic pain Pain Location: Back Pain Orientation: Mid Pain Descriptors / Indicators: Aching Pain Onset: In the past 7 days Pain Frequency: Occasional Pain Relieving Factors: heat Effect of Pain on Daily Activities: "doesn't slow me down"  Pain Relieving Factors: heat  Nutritional Risks: None Diabetes: Yes CBG done?: No Did pt. bring in CBG monitor from home?: No  How often do you need to have someone help you when you read instructions, pamphlets, or other written materials from your doctor or pharmacy?: 1 - Never  Interpreter Needed?: No     Activities of Daily Living In your present state of health, do you have any difficulty performing the following activities: 11/30/2017  Hearing? N  Vision? N  Difficulty concentrating or making decisions? N  Walking or climbing stairs? N  Dressing or bathing? N  Doing errands, shopping? N  Preparing Food and eating ? N  Using the Toilet? N  In the past six months, have you accidently leaked urine? N  Do you have problems with loss of bowel control? N  Managing your Medications? N  Managing your Finances? N  Housekeeping or managing your Housekeeping? N  Some recent data might be hidden     Immunizations and Health Maintenance Immunization History  Administered Date(s) Administered  . Influenza Split 08/29/2014  . Influenza, High Dose Seasonal PF 08/22/2015  . Influenza-Unspecified 08/28/2017  . Pneumococcal Conjugate-13  10/05/2016   Health Maintenance Due  Topic Date Due  . TETANUS/TDAP  04/04/1969  . FOOT EXAM  03/05/2017  . OPHTHALMOLOGY EXAM  07/03/2017  . PNA vac Low Risk Adult (2 of 2 - PPSV23) 10/05/2017    Patient Care Team: Dettinger, Fransisca Kaufmann, MD as PCP - General (Family Medicine)  Indicate any recent Medical Services you may have received from other than [facility] providers in the past year (date may be approximate).     Assessment:   This is a routine wellness examination for Bernis.Litsey.  Dietary issues and exercise activities discussed: Current Exercise Habits: Home  exercise routine, Type of exercise: walking, Time (Minutes): 60, Frequency (Times/Week): 5, Weekly Exercise (Minutes/Week): 300, Intensity: Moderate, Exercise limited by: None identified  Goals    . Exercise 3x per week (30 min per time)      Eat three healthy meals daily  Depression Screen PHQ 2/9 Scores 11/30/2017 10/13/2017 07/09/2017 04/07/2017 01/06/2017 06/22/2016 03/05/2016  PHQ - 2 Score 0 0 0 0 0 0 0    Fall Risk Fall Risk  11/30/2017 10/13/2017 07/09/2017 04/07/2017 01/06/2017  Falls in the past year? _0     Is the patient's home free of loose throw rugs in walkways, pet beds, electrical cords, etc?   Yes      Grab bars in the bathroom? No      Handrails on the stairs?   Yes      Adequate lighting?   Yes   Cognitive Function: MMSE - Mini Mental State Exam 11/30/2017  Orientation to time 5  Orientation to Place 5  Registration 3  Attention/ Calculation 5  Recall 3  Language- name 2 objects 2  Language- repeat 1  Language- follow 3 step command 3  Language- read & follow direction 1  Write a sentence 1  Copy design 1  Total score 30   Patient performed well on her MMSE, scoring 30 out of a possible 30 points.   6CIT Screen 11/30/2017  What Year? 0 points  What month? 0 points  Count back from 20 0 points  Months in reverse 0 points  Repeat phrase 0 points    Screening Tests Health  Maintenance  Topic Date Due  . TETANUS/TDAP  04/04/1969  . FOOT EXAM  03/05/2017  . OPHTHALMOLOGY EXAM  07/03/2017  . PNA vac Low Risk Adult (2 of 2 - PPSV23) 10/05/2017  . HEMOGLOBIN A1C  04/04/2018  . MAMMOGRAM  07/03/2018  . URINE MICROALBUMIN  10/05/2018  . COLONOSCOPY  08/07/2027  . INFLUENZA VACCINE  Completed  . DEXA SCAN  Completed  . Hepatitis C Screening  Completed    Qualifies for Shingles Vaccine?  No, patient will check with [facility] for her Shingrix vaccine, unable to give here because of patient insurance.  Cancer Screenings: Lung: Low Dose CT Chest recommended if Age 42-80 years, 30 pack-year currently smoking OR have quit w/in 15years. Patient  Qualify. No  Breast: Up to date on Mammogram? 06/25/2016   Up to date of Bone Density/Dexa? Yes, 04/07/2017 Colorectal: Yes, 08/06/2017     Plan:     Patient is to schedule an appointment for an eye exam.  Because of her insurance, we are not able to administer her Tdap or Shingrix vaccine in our office.  Patient is to follow [facility] with [facility] to see if this can be administered there.  She is due for her mammogram and will schedule that also.  She [facility] has [facility] at [facility]  I have personally reviewed and noted the following in the patient's chart:   . Medical and social history . Use of alcohol, tobacco or illicit drugs  . Current medications and supplements . Functional ability and status . Nutritional status . Physical activity . Advanced directives . List of other physicians . Hospitalizations, surgeries, and ER visits in previous 12 months . Vitals . Screenings to include cognitive, depression, and falls . Referrals and appointments  In addition, I have reviewed and discussed with patient certain preventive protocols, quality metrics, and best practice recommendations. A written personalized  care plan for preventive services as well as general preventive health recommendations were  provided to patient.     Burnadette Pop, LPN   [id number]    I have reviewed and agree with the above AWV documentation.   Evelina Dun, FNP

## 2017-11-30 NOTE — Patient Instructions (Addendum)
  Ms. Misty [name] , Thank you for taking time to come for your Medicare Wellness Visit. I appreciate your ongoing commitment to your health goals. Please review the following plan we discussed and let me know if I can assist you in the future.   These are the goals we discussed: Goals    . Exercise 3x per week (30 min per time)        Eat 3 meals including health fruits and vegetables per day.   This is a list of the screening recommended for you and due dates:  Health Maintenance  Topic Date Due  . Tetanus Vaccine  04/04/1969  . Complete foot exam   03/05/2017  . Eye exam for diabetics  07/03/2017  . Pneumonia vaccines (2 of 2 - PPSV23) 10/05/2017  . Hemoglobin A1C  04/04/2018  . Mammogram  07/03/2018  . Urine Protein Check  10/05/2018  . Colon Cancer Screening  08/07/2027  . Flu Shot  Completed  . DEXA scan (bone density measurement)  Completed  .  Hepatitis C: One time screening is recommended by Center for Disease Control  (CDC) for  adults born from [birth year] through 1966.   Completed   Schedule a mammogram with [facility], eye exam with [facility], check with [facility] to have your Tdap and Shingrix vaccine since we are not able to give because of your [employer].  Followup with your PCP, Dr. Warrick Parisian, on 01/30/18.

## 2018-01-08 ENCOUNTER — Other Ambulatory Visit: Payer: Self-pay | Admitting: Family Medicine

## 2018-01-08 DIAGNOSIS — E119 Type 2 diabetes mellitus without complications: Secondary | ICD-10-CM

## 2018-01-08 DIAGNOSIS — E785 Hyperlipidemia, unspecified: Secondary | ICD-10-CM

## 2018-01-10 NOTE — Telephone Encounter (Signed)
OV 01/20/18

## 2018-01-17 ENCOUNTER — Other Ambulatory Visit: Payer: PPO

## 2018-01-17 ENCOUNTER — Other Ambulatory Visit: Payer: Self-pay | Admitting: Family Medicine

## 2018-01-17 DIAGNOSIS — E1169 Type 2 diabetes mellitus with other specified complication: Secondary | ICD-10-CM

## 2018-01-17 DIAGNOSIS — E785 Hyperlipidemia, unspecified: Secondary | ICD-10-CM

## 2018-01-17 DIAGNOSIS — E119 Type 2 diabetes mellitus without complications: Secondary | ICD-10-CM

## 2018-01-17 DIAGNOSIS — I10 Essential (primary) hypertension: Secondary | ICD-10-CM | POA: Diagnosis not present

## 2018-01-17 LAB — BAYER DCA HB A1C WAIVED: HB A1C (BAYER DCA - WAIVED): 6.8 % (ref <7.0)

## 2018-01-18 LAB — CMP14+EGFR
ALK PHOS: 67 IU/L (ref 39–117)
ALT: 14 IU/L (ref 0–32)
AST: 23 IU/L (ref 0–40)
Albumin/Globulin Ratio: 2 (ref 1.2–2.2)
Albumin: 4.2 g/dL (ref 3.6–4.8)
BUN/Creatinine Ratio: 17 (ref 12–28)
BUN: 13 mg/dL (ref 8–27)
CHLORIDE: 103 mmol/L (ref 96–106)
CO2: 22 mmol/L (ref 20–29)
CREATININE: 0.77 mg/dL (ref 0.57–1.00)
Calcium: 9.2 mg/dL (ref 8.7–10.3)
GFR calc non Af Amer: 80 mL/min/{1.73_m2} (ref >59)
GFR, EST AFRICAN AMERICAN: 92 mL/min/{1.73_m2} (ref >59)
GLOBULIN, TOTAL: 2.1 g/dL (ref 1.5–4.5)
GLUCOSE: 115 mg/dL — AB (ref 65–99)
Potassium: 4.5 mmol/L (ref 3.5–5.2)
SODIUM: 142 mmol/L (ref 134–144)
TOTAL PROTEIN: 6.3 g/dL (ref 6.0–8.5)

## 2018-01-18 LAB — LIPID PANEL
CHOLESTEROL TOTAL: 172 mg/dL (ref 100–199)
Chol/HDL Ratio: 2.8 ratio (ref 0.0–4.4)
HDL: 61 mg/dL (ref >39)
LDL Calculated: 75 mg/dL (ref 0–99)
Triglycerides: 179 mg/dL — ABNORMAL HIGH (ref 0–149)
VLDL CHOLESTEROL CAL: 36 mg/dL (ref 5–40)

## 2018-01-20 ENCOUNTER — Ambulatory Visit: Payer: PPO | Admitting: Family Medicine

## 2018-01-27 ENCOUNTER — Ambulatory Visit (INDEPENDENT_AMBULATORY_CARE_PROVIDER_SITE_OTHER): Payer: PPO | Admitting: Family Medicine

## 2018-01-27 ENCOUNTER — Encounter: Payer: Self-pay | Admitting: Family Medicine

## 2018-01-27 VITALS — BP 129/83 | HR 74 | Temp 97.1°F | Ht 62.0 in | Wt 115.0 lb

## 2018-01-27 DIAGNOSIS — E1169 Type 2 diabetes mellitus with other specified complication: Secondary | ICD-10-CM

## 2018-01-27 DIAGNOSIS — K219 Gastro-esophageal reflux disease without esophagitis: Secondary | ICD-10-CM | POA: Diagnosis not present

## 2018-01-27 DIAGNOSIS — E785 Hyperlipidemia, unspecified: Secondary | ICD-10-CM

## 2018-01-27 DIAGNOSIS — E119 Type 2 diabetes mellitus without complications: Secondary | ICD-10-CM

## 2018-01-27 DIAGNOSIS — M8540 Solitary bone cyst, unspecified site: Secondary | ICD-10-CM | POA: Diagnosis not present

## 2018-01-27 DIAGNOSIS — Z23 Encounter for immunization: Secondary | ICD-10-CM

## 2018-01-27 MED ORDER — RANITIDINE HCL 150 MG PO TABS: 150.0000 mg | ORAL_TABLET | Freq: Two times a day (BID) | ORAL | 3 refills | Status: DC

## 2018-01-27 NOTE — Progress Notes (Signed)
BP 129/83   Pulse 74   Temp (!) 97.1 F (36.2 C) (Oral)   Ht 5' 2"  (1.575 m)   Wt 115 lb (52.2 kg)   BMI 21.03 kg/m    Subjective:    Patient ID: Misty [name], female    DOB: [birth year], 67 y.o.   MRN: [id number]  HPI: Misty [name] is a 67 y.o. female presenting on 01/27/2018 for Diabetes; Hyperlipidemia; and Knot on left index finger (no injury)   HPI Type 2 diabetes mellitus Patient comes in today for recheck of his diabetes. Patient has been currently taking metformin and glipizide. Patient is not currently on an ACE inhibitor/ARB. Patient has not seen an ophthalmologist this year. Patient denies any issues with their feet.   Hyperlipidemia Patient is coming in for recheck of his hyperlipidemia. The patient is currently taking simvastatin. They deny any issues with myalgias or history of liver damage from it. They deny any focal numbness or weakness or chest pain.   GERD Patient is currently on no medication but she has been having more issues.  She denies any major symptoms or abdominal pain or belching or burping. She denies any blood in her stool or lightheadedness or dizziness.   Patient has a small lump on the back of her left index finger that has been there for a few weeks.  She says is nontender and moves easily.  She denies any overlying skin changes.  Relevant past medical, surgical, family and social history reviewed and updated as indicated. Interim medical history since our last visit reviewed. Allergies and medications reviewed and updated.  Review of Systems  Constitutional: Negative for chills and fever.  Eyes: Negative for visual disturbance.  Respiratory: Negative for chest tightness and shortness of breath.   Cardiovascular: Negative for chest pain and leg swelling.  Gastrointestinal: Positive for abdominal pain. Negative for abdominal distention, blood in stool, constipation, diarrhea, nausea and vomiting.  Musculoskeletal: Negative for back  pain and gait problem.  Skin: Negative for rash.  Neurological: Negative for light-headedness and headaches.  Psychiatric/Behavioral: Negative for agitation and behavioral problems.  All other systems reviewed and are negative.   Per HPI unless specifically indicated above   Allergies as of 01/27/2018      Reactions   Atorvastatin Other (See Comments)   MUSCLE PAIN       Medication List        Accurate as of 01/27/18 11:43 AM. Always use your most recent med list.          blood glucose meter kit and supplies Kit Dispense based on patient and insurance preference. Use up to four times daily as directed. (FOR ICD-9 250.00, 250.01).   glipiZIDE 5 MG 24 hr tablet Commonly known as:  GLUCOTROL XL TAKE ONE TABLET BY MOUTH DAIL Y   metFORMIN 1000 MG tablet Commonly known as:  GLUCOPHAGE Take 1,000 mg by mouth daily with breakfast.   ONE TOUCH ULTRA TEST test strip Generic drug:  glucose blood USE 4 TIMES DAILY AS DRECTED   simvastatin 40 MG tablet Commonly known as:  ZOCOR TAKE 1 TABLET BY MOUTH EVERY DAY   VISION FORMULA 2 PO Take 1 capsule by mouth at bedtime.          Objective:    BP 129/83   Pulse 74   Temp (!) 97.1 F (36.2 C) (Oral)   Ht 5' 2"  (1.575 m)   Wt 115 lb (52.2 kg)   BMI 21.03 kg/m  Wt Readings from Last 3 Encounters:  01/27/18 115 lb (52.2 kg)  11/30/17 117 lb 4 oz (53.2 kg)  10/13/17 122 lb (55.3 kg)    Physical Exam  Constitutional: She is oriented to person, place, and time. She appears well-developed and well-nourished. No distress.  Eyes: Conjunctivae are normal.  Neck: Neck supple. No thyromegaly present.  Cardiovascular: Normal rate, regular rhythm, normal heart sounds and intact distal pulses.  No murmur heard. Pulmonary/Chest: Effort normal and breath sounds normal. No respiratory distress. She has no wheezes. She has no rales.  Abdominal: Soft. Bowel sounds are normal. She exhibits no distension. There is tenderness  (Epigastric and left upper quadrant abdominal tenderness). There is no rebound.  Musculoskeletal:       Hands: Lymphadenopathy:    She has no cervical adenopathy.  Neurological: She is alert and oriented to person, place, and time. Coordination normal.  Skin: Skin is warm and dry. No rash noted. She is not diaphoretic.  Psychiatric: She has a normal mood and affect. Her behavior is normal.  Nursing note and vitals reviewed.     Assessment & Plan:   Problem List Items Addressed This Visit      Endocrine   Type 2 diabetes mellitus ([place]) - Primary   Relevant Orders   Microalbumin / creatinine urine ratio   Hyperlipidemia associated with type 2 diabetes mellitus ([place])    Other Visit Diagnoses    Simple bone cyst       Small simple cyst on dorsum of left index finger, monitor for now   Gastroesophageal reflux disease without esophagitis       Relevant Medications   ranitidine (ZANTAC) 150 MG tablet       Follow up plan: Return in about 3 months (around 04/29/2018), or if symptoms worsen or fail to improve, for Recheck diabetes and cholesterol.  Counseling provided for all of the vaccine components Orders Placed This Encounter  Procedures  . Pneumococcal polysaccharide vaccine 23-valent greater than or equal to 2yo subcutaneous/IM  . Microalbumin / creatinine urine ratio    Caryl Pina, MD [facility] 01/27/2018, 11:43 AM

## 2018-03-25 ENCOUNTER — Other Ambulatory Visit: Payer: Self-pay | Admitting: Family Medicine

## 2018-03-25 DIAGNOSIS — K219 Gastro-esophageal reflux disease without esophagitis: Secondary | ICD-10-CM

## 2018-04-01 ENCOUNTER — Other Ambulatory Visit: Payer: Self-pay

## 2018-04-01 ENCOUNTER — Encounter: Payer: Self-pay | Admitting: Family Medicine

## 2018-04-01 ENCOUNTER — Ambulatory Visit (INDEPENDENT_AMBULATORY_CARE_PROVIDER_SITE_OTHER): Payer: PPO | Admitting: Family Medicine

## 2018-04-01 VITALS — BP 105/63 | HR 92 | Temp 97.6°F | Ht 62.0 in | Wt 115.8 lb

## 2018-04-01 DIAGNOSIS — R42 Dizziness and giddiness: Secondary | ICD-10-CM | POA: Diagnosis not present

## 2018-04-01 LAB — MICROSCOPIC EXAMINATION
BACTERIA UA: NONE SEEN
EPITHELIAL CELLS (NON RENAL): NONE SEEN /HPF (ref 0–10)
RBC MICROSCOPIC, UA: NONE SEEN /HPF (ref 0–2)
RENAL EPITHEL UA: NONE SEEN /HPF

## 2018-04-01 LAB — URINALYSIS, COMPLETE
Bilirubin, UA: NEGATIVE
Leukocytes, UA: NEGATIVE
NITRITE UA: NEGATIVE
Protein, UA: NEGATIVE
RBC, UA: NEGATIVE
Specific Gravity, UA: 1.02 (ref 1.005–1.030)
UUROB: 0.2 mg/dL (ref 0.2–1.0)
pH, UA: 5 (ref 5.0–7.5)

## 2018-04-01 MED ORDER — AMOXICILLIN 500 MG PO CAPS: 500.0000 mg | ORAL_CAPSULE | Freq: Three times a day (TID) | ORAL | 0 refills | Status: DC

## 2018-04-01 NOTE — Patient Outreach (Signed)
[facility]) Care Management  04/01/2018  Misty [name] Jul 12, 1951 [id number]    TELEPHONE SCREENING Referral date: 04/01/18 Referral source: nurse call center report Referral reason: low blood pressure Insurance: Health team advantag  Telephone call to patient regarding  Nurse call line referral.  Unable to reach patient. HIPAA compliant voice message left with call back phone number.  PLAN; RNCM will attempt  2nd  telephone call to patient within 4 business days.  RNCM will send patient outreach letter.  [name] RN,BSN,CCM [facility] Telephonic  [phone]

## 2018-04-01 NOTE — Progress Notes (Signed)
HPI  Patient presents today for dizziness and unsteadiness.  Patient states that she has had dizziness and she feels that she may have low blood pressure.  She is measured her blood pressure at home to be 90/50 this morning and 95/59 and another occasion.  When she first stands up she has dizziness that lasts a few seconds, this morning she had unsteadiness type dizziness after standing out of bed that lasted about 2 minutes.  She states that she has been working heavily in the yard recently.  No new medications. No signs of infection  PMH: Smoking status noted ROS: Per HPI  Objective: BP 104/62   Pulse 86   Temp 97.6 F (36.4 C) (Oral)   Ht 5' 2" (1.575 m)   Wt 115 lb 12.8 oz (52.5 kg)   BMI 21.18 kg/m  Gen: NAD, alert, cooperative with exam HEENT: NCAT, oromucosa moist CV: RRR, good S1/S2, no murmur Resp: CTABL, no wheezes, non-labored Abd: SNTND, BS present, no guarding or organomegaly Ext: No edema, warm Neuro: Alert and oriented, No gross deficits  Assessment and plan:  #Dizziness Likely slight volume depletion Labs today, also UA to look for UTI. No symptoms of serious bacterial infection Recommended aggressive rehydration, red flags reviewed for seeking emergent medical care.  Orthostatic blood pressures checked and documented by my CMA, no signs of true orthostatic hypotension.   Orders Placed This Encounter  Procedures  . CBC with Differential/Platelet  . CMP14+EGFR  . Urinalysis, Complete    Laroy Apple, MD [place] 04/01/2018, 2:17 PM

## 2018-04-01 NOTE — Patient Instructions (Signed)
Great to see you!   

## 2018-04-02 LAB — CBC WITH DIFFERENTIAL/PLATELET
BASOS: 0 %
Basophils Absolute: 0 10*3/uL (ref 0.0–0.2)
EOS (ABSOLUTE): 0 10*3/uL (ref 0.0–0.4)
Eos: 0 %
Hematocrit: 38.6 % (ref 34.0–46.6)
Hemoglobin: 12.7 g/dL (ref 11.1–15.9)
IMMATURE GRANS (ABS): 0 10*3/uL (ref 0.0–0.1)
Immature Granulocytes: 0 %
Lymphocytes Absolute: 3.1 10*3/uL (ref 0.7–3.1)
Lymphs: 45 %
MCH: 32.2 pg (ref 26.6–33.0)
MCHC: 32.9 g/dL (ref 31.5–35.7)
MCV: 98 fL — AB (ref 79–97)
MONOS ABS: 0.4 10*3/uL (ref 0.1–0.9)
Monocytes: 6 %
Neutrophils Absolute: 3.3 10*3/uL (ref 1.4–7.0)
Neutrophils: 49 %
PLATELETS: 264 10*3/uL (ref 150–450)
RBC: 3.94 x10E6/uL (ref 3.77–5.28)
RDW: 13.2 % (ref 12.3–15.4)
WBC: 6.9 10*3/uL (ref 3.4–10.8)

## 2018-04-02 LAB — CMP14+EGFR
A/G RATIO: 1.7 (ref 1.2–2.2)
ALT: 17 IU/L (ref 0–32)
AST: 18 IU/L (ref 0–40)
Albumin: 4.1 g/dL (ref 3.6–4.8)
Alkaline Phosphatase: 82 IU/L (ref 39–117)
BILIRUBIN TOTAL: 0.2 mg/dL (ref 0.0–1.2)
BUN / CREAT RATIO: 28 (ref 12–28)
BUN: 27 mg/dL (ref 8–27)
CO2: 23 mmol/L (ref 20–29)
Calcium: 9.5 mg/dL (ref 8.7–10.3)
Chloride: 100 mmol/L (ref 96–106)
Creatinine, Ser: 0.95 mg/dL (ref 0.57–1.00)
GFR calc non Af Amer: 62 mL/min/{1.73_m2} (ref >59)
GFR, EST AFRICAN AMERICAN: 72 mL/min/{1.73_m2} (ref >59)
GLOBULIN, TOTAL: 2.4 g/dL (ref 1.5–4.5)
Glucose: 281 mg/dL — ABNORMAL HIGH (ref 65–99)
POTASSIUM: 4.7 mmol/L (ref 3.5–5.2)
SODIUM: 138 mmol/L (ref 134–144)
TOTAL PROTEIN: 6.5 g/dL (ref 6.0–8.5)

## 2018-04-07 ENCOUNTER — Other Ambulatory Visit: Payer: Self-pay

## 2018-04-07 NOTE — Patient Outreach (Signed)
[facility]) Care Management  04/07/2018  Misty [name] [birth year] [id number]    TELEPHONE SCREENING Referral date: 04/01/18 Referral source: nurse call center report Referral reason: low blood pressure Insurance: Health team advantage Attempt #2  Telephone call to patient regarding  Nurse call line referral.  Unable to reach patient. HIPAA compliant voice message left with call back phone number.  PLAN; RNCM will attempt 3rd  telephone call to patient within 4 business days.   [name] RN,BSN,CCM [facility] Telephonic  [phone]

## 2018-04-12 ENCOUNTER — Other Ambulatory Visit: Payer: Self-pay

## 2018-04-12 NOTE — Patient Outreach (Signed)
[facility]) Care Management  04/12/2018  Misty [name] 06/10/1951 [id number]  TELEPHONE SCREENING Referral date:04/01/18 Referral source:nurse call center report Referral reason:low blood pressure Insurance:Health team advantage  Received return call from patient. HIPAA verified with patient. Explained reason for call.  Patient states she is doing fine now. She reports she followed up with her doctor regarding her low blood pressure and was found to be dehydrated. Patient states she is checking her blood pressures 2-3 times per day and they are doing fine. She reports her most recent blood pressure reading ws 110/66. Patient states she has a follow up appointment with her primary MD on 04/29/18.   Patient denies any further needs or concerns at this time.  Confirms she knows what problems to call her doctor for and how to reach.  States she has the contact number for the nurse call line.   PLAN: RNCM will close patient due to patient being assessed and having no further needs.  RNCM will send patients primary MD closure notification  [name] RN,BSN,CCM [facility] Telephonic  [phone]

## 2018-04-12 NOTE — Patient Outreach (Signed)
[place] [facility]) Care Management  04/12/2018  Misty [name] 08-Jan-1951 [id number]   TELEPHONE SCREENING Referral date:04/01/18 Referral source:nurse call center report Referral reason:low blood pressure Insurance:Health team advantage Attempt #3  Telephone call to patient regardingNurse call line referral.Unable to reach patient. HIPAA compliant voice message left with call back phone number.  PLAN; If no return call from patient will proceed with case closure  [name] RN,BSN,CCM [facility] Telephonic  [phone]

## 2018-04-22 ENCOUNTER — Ambulatory Visit: Payer: PPO

## 2018-04-25 ENCOUNTER — Other Ambulatory Visit: Payer: PPO

## 2018-04-25 DIAGNOSIS — E1169 Type 2 diabetes mellitus with other specified complication: Secondary | ICD-10-CM

## 2018-04-25 DIAGNOSIS — E119 Type 2 diabetes mellitus without complications: Secondary | ICD-10-CM

## 2018-04-25 DIAGNOSIS — E785 Hyperlipidemia, unspecified: Secondary | ICD-10-CM

## 2018-04-25 LAB — BAYER DCA HB A1C WAIVED: HB A1C (BAYER DCA - WAIVED): 7.2 % — ABNORMAL HIGH (ref <7.0)

## 2018-04-26 LAB — CBC WITH DIFFERENTIAL/PLATELET
BASOS ABS: 0 10*3/uL (ref 0.0–0.2)
BASOS: 1 %
EOS (ABSOLUTE): 0 10*3/uL (ref 0.0–0.4)
EOS: 1 %
HEMATOCRIT: 37.3 % (ref 34.0–46.6)
HEMOGLOBIN: 12.3 g/dL (ref 11.1–15.9)
IMMATURE GRANS (ABS): 0 10*3/uL (ref 0.0–0.1)
Immature Granulocytes: 0 %
LYMPHS ABS: 2.6 10*3/uL (ref 0.7–3.1)
Lymphs: 49 %
MCH: 31.9 pg (ref 26.6–33.0)
MCHC: 33 g/dL (ref 31.5–35.7)
MCV: 97 fL (ref 79–97)
MONOCYTES: 8 %
Monocytes Absolute: 0.4 10*3/uL (ref 0.1–0.9)
NEUTROS ABS: 2.1 10*3/uL (ref 1.4–7.0)
Neutrophils: 41 %
Platelets: 236 10*3/uL (ref 150–450)
RBC: 3.85 x10E6/uL (ref 3.77–5.28)
RDW: 13.9 % (ref 12.3–15.4)
WBC: 5.2 10*3/uL (ref 3.4–10.8)

## 2018-04-26 LAB — CMP14+EGFR
ALBUMIN: 4 g/dL (ref 3.6–4.8)
ALT: 20 IU/L (ref 0–32)
AST: 22 IU/L (ref 0–40)
Albumin/Globulin Ratio: 2 (ref 1.2–2.2)
Alkaline Phosphatase: 68 IU/L (ref 39–117)
BUN / CREAT RATIO: 24 (ref 12–28)
BUN: 18 mg/dL (ref 8–27)
Bilirubin Total: 0.2 mg/dL (ref 0.0–1.2)
CALCIUM: 8.9 mg/dL (ref 8.7–10.3)
CO2: 23 mmol/L (ref 20–29)
CREATININE: 0.74 mg/dL (ref 0.57–1.00)
Chloride: 106 mmol/L (ref 96–106)
GFR, EST AFRICAN AMERICAN: 96 mL/min/{1.73_m2} (ref >59)
GFR, EST NON AFRICAN AMERICAN: 84 mL/min/{1.73_m2} (ref >59)
Globulin, Total: 2 g/dL (ref 1.5–4.5)
Glucose: 103 mg/dL — ABNORMAL HIGH (ref 65–99)
Potassium: 4.7 mmol/L (ref 3.5–5.2)
Sodium: 142 mmol/L (ref 134–144)
TOTAL PROTEIN: 6 g/dL (ref 6.0–8.5)

## 2018-04-26 LAB — LIPID PANEL
CHOL/HDL RATIO: 2.2 ratio (ref 0.0–4.4)
Cholesterol, Total: 160 mg/dL (ref 100–199)
HDL: 72 mg/dL (ref >39)
LDL Calculated: 65 mg/dL (ref 0–99)
TRIGLYCERIDES: 117 mg/dL (ref 0–149)
VLDL CHOLESTEROL CAL: 23 mg/dL (ref 5–40)

## 2018-04-29 ENCOUNTER — Ambulatory Visit (INDEPENDENT_AMBULATORY_CARE_PROVIDER_SITE_OTHER): Payer: PPO | Admitting: Family Medicine

## 2018-04-29 ENCOUNTER — Encounter: Payer: Self-pay | Admitting: Family Medicine

## 2018-04-29 VITALS — BP 132/77 | HR 70 | Temp 97.8°F | Ht 62.0 in | Wt 117.0 lb

## 2018-04-29 DIAGNOSIS — E1169 Type 2 diabetes mellitus with other specified complication: Secondary | ICD-10-CM | POA: Diagnosis not present

## 2018-04-29 DIAGNOSIS — E785 Hyperlipidemia, unspecified: Secondary | ICD-10-CM | POA: Diagnosis not present

## 2018-04-29 DIAGNOSIS — E119 Type 2 diabetes mellitus without complications: Secondary | ICD-10-CM | POA: Diagnosis not present

## 2018-04-29 MED ORDER — SIMVASTATIN 40 MG PO TABS: 40.0000 mg | ORAL_TABLET | Freq: Every day | ORAL | 1 refills | Status: DC

## 2018-04-29 MED ORDER — GLIPIZIDE ER 5 MG PO TB24: ORAL_TABLET | ORAL | 1 refills | Status: DC

## 2018-04-29 NOTE — Progress Notes (Signed)
BP 132/77   Pulse 70   Temp 97.8 F (36.6 C) (Oral)   Ht _0  (1.575 m)   Wt 117 lb (53.1 kg)   BMI 21.40 kg/m    Subjective:    Patient ID: Misty [name], female    DOB: Nov 16, 1950, 67 y.o.   MRN: [id number]  HPI: Misty [name] is a 67 y.o. female presenting on 04/29/2018 for Diabetes and Hyperlipidemia   HPI Type 2 diabetes mellitus Patient comes in today for recheck of his diabetes. Patient has been currently taking metformin and glipizide, her A1c was up just a little bit at 7.2, reemphasized diet and exercise and avoiding carbohydrates.. Patient is not currently on an ACE inhibitor/ARB. Patient has not seen an ophthalmologist this year. Patient denies any issues with their feet.   Hyperlipidemia Patient is coming in for recheck of his hyperlipidemia. The patient is currently taking simvastatin. They deny any issues with myalgias or history of liver damage from it. They deny any focal numbness or weakness or chest pain.   Relevant past medical, surgical, family and social history reviewed and updated as indicated. Interim medical history since our last visit reviewed. Allergies and medications reviewed and updated.  Review of Systems  Constitutional: Negative for chills and fever.  HENT: Negative for congestion, ear discharge and ear pain.   Eyes: Negative for redness and visual disturbance.  Respiratory: Negative for chest tightness and shortness of breath.   Cardiovascular: Negative for chest pain and leg swelling.  Genitourinary: Negative for difficulty urinating and dysuria.  Musculoskeletal: Negative for back pain and gait problem.  Skin: Negative for rash.  Neurological: Negative for light-headedness and headaches.  Psychiatric/Behavioral: Negative for agitation and behavioral problems.  All other systems reviewed and are negative.   Per HPI unless specifically indicated above   Allergies as of 04/29/2018      Reactions   Atorvastatin Other (See  Comments)   MUSCLE PAIN       Medication List        Accurate as of 04/29/18  9:18 AM. Always use your most recent med list.          blood glucose meter kit and supplies Kit Dispense based on patient and insurance preference. Use up to four times daily as directed. (FOR ICD-9 250.00, 250.01).   glipiZIDE 5 MG 24 hr tablet Commonly known as:  GLUCOTROL XL TAKE ONE TABLET BY MOUTH DAIL Y   metFORMIN 1000 MG tablet Commonly known as:  GLUCOPHAGE TAKE 1 TABLET BY MOUTH TWICE A DAY WITH MEALS   ONE TOUCH ULTRA TEST test strip Generic drug:  glucose blood USE 4 TIMES DAILY AS DRECTED   simvastatin 40 MG tablet Commonly known as:  ZOCOR Take 1 tablet (40 mg total) by mouth daily.   VISION FORMULA 2 PO Take 1 capsule by mouth at bedtime.          Objective:    BP 132/77   Pulse 70   Temp 97.8 F (36.6 C) (Oral)   Ht _1  (1.575 m)   Wt 117 lb (53.1 kg)   BMI 21.40 kg/m   Wt Readings from Last 3 Encounters:  04/29/18 117 lb (53.1 kg)  04/01/18 115 lb 12.8 oz (52.5 kg)  01/27/18 115 lb (52.2 kg)    Physical Exam  Constitutional: She is oriented to person, place, and time. She appears well-developed and well-nourished. No distress.  Eyes: Conjunctivae are normal.  Neck: Neck supple. No thyromegaly  present.  Cardiovascular: Normal rate, regular rhythm, normal heart sounds and intact distal pulses.  No murmur heard. Pulmonary/Chest: Effort normal and breath sounds normal. No respiratory distress. She has no wheezes.  Musculoskeletal: Normal range of motion. She exhibits no edema or tenderness.  Lymphadenopathy:    She has no cervical adenopathy.  Neurological: She is alert and oriented to person, place, and time. Coordination normal.  Skin: Skin is warm and dry. No rash noted. She is not diaphoretic.  Psychiatric: She has a normal mood and affect. Her behavior is normal.  Nursing note and vitals reviewed.   Results for orders placed or performed in visit on  04/25/18  CMP14+EGFR  Result Value Ref Range   Glucose 103 (H) 65 - 99 mg/dL   BUN 18 8 - 27 mg/dL   Creatinine, Ser 0.74 0.57 - 1.00 mg/dL   GFR calc non Af Amer 84 >59 mL/min/1.73   GFR calc Af Amer 96 >59 mL/min/1.73   BUN/Creatinine Ratio 24 12 - 28   Sodium 142 134 - 144 mmol/L   Potassium 4.7 3.5 - 5.2 mmol/L   Chloride 106 96 - 106 mmol/L   CO2 23 20 - 29 mmol/L   Calcium 8.9 8.7 - 10.3 mg/dL   Total Protein 6.0 6.0 - 8.5 g/dL   Albumin 4.0 3.6 - 4.8 g/dL   Globulin, Total 2.0 1.5 - 4.5 g/dL   Albumin/Globulin Ratio 2.0 1.2 - 2.2   Bilirubin Total 0.2 0.0 - 1.2 mg/dL   Alkaline Phosphatase 68 39 - 117 IU/L   AST 22 0 - 40 IU/L   ALT 20 0 - 32 IU/L  CBC with Differential/Platelet  Result Value Ref Range   WBC 5.2 3.4 - 10.8 x10E3/uL   RBC 3.85 3.77 - 5.28 x10E6/uL   Hemoglobin 12.3 11.1 - 15.9 g/dL   Hematocrit 37.3 34.0 - 46.6 %   MCV 97 79 - 97 fL   MCH 31.9 26.6 - 33.0 pg   MCHC 33.0 31.5 - 35.7 g/dL   RDW 13.9 12.3 - 15.4 %   Platelets 236 150 - 450 x10E3/uL   Neutrophils 41 Not Estab. %   Lymphs 49 Not Estab. %   Monocytes 8 Not Estab. %   Eos 1 Not Estab. %   Basos 1 Not Estab. %   Neutrophils Absolute 2.1 1.4 - 7.0 x10E3/uL   Lymphocytes Absolute 2.6 0.7 - 3.1 x10E3/uL   Monocytes Absolute 0.4 0.1 - 0.9 x10E3/uL   EOS (ABSOLUTE) 0.0 0.0 - 0.4 x10E3/uL   Basophils Absolute 0.0 0.0 - 0.2 x10E3/uL   Immature Granulocytes 0 Not Estab. %   Immature Grans (Abs) 0.0 0.0 - 0.1 x10E3/uL  Lipid panel  Result Value Ref Range   Cholesterol, Total 160 100 - 199 mg/dL   Triglycerides 117 0 - 149 mg/dL   HDL 72 >39 mg/dL   VLDL Cholesterol Cal 23 5 - 40 mg/dL   LDL Calculated 65 0 - 99 mg/dL   Chol/HDL Ratio 2.2 0.0 - 4.4 ratio  Bayer DCA Hb A1c Waived  Result Value Ref Range   HB A1C (BAYER DCA - WAIVED) 7.2 (H) <7.0 %      Assessment & Plan:   Problem List Items Addressed This Visit      Endocrine   Type 2 diabetes mellitus ([facility])   Relevant Medications     simvastatin (ZOCOR) 40 MG tablet   glipiZIDE (GLUCOTROL XL) 5 MG 24 hr tablet   Other Relevant Orders  Bayer DCA Hb A1c Waived   CMP14+EGFR   Hyperlipidemia associated with type 2 diabetes mellitus ([place]) - Primary   Relevant Medications   simvastatin (ZOCOR) 40 MG tablet   glipiZIDE (GLUCOTROL XL) 5 MG 24 hr tablet   Other Relevant Orders   Lipid panel     Continue current medication for now, may need to increase if it continues to go up, patient is looking to get back into a job and that will likely improve her numbers.  We rediscussed diet and what to avoid and she will continue to work on that.  Follow up plan: Return in about 3 months (around 07/30/2018), or if symptoms worsen or fail to improve, for Diabetes and cholesterol recheck.  Counseling provided for all of the vaccine components Orders Placed This Encounter  Procedures  . Bayer DCA Hb A1c Waived  . CMP14+EGFR  . Lipid panel    Caryl Pina, MD [facility] 04/29/2018, 9:18 AM

## 2018-06-06 ENCOUNTER — Ambulatory Visit: Payer: PPO | Admitting: *Deleted

## 2018-06-21 ENCOUNTER — Other Ambulatory Visit: Payer: Self-pay | Admitting: Family Medicine

## 2018-07-29 ENCOUNTER — Other Ambulatory Visit: Payer: PPO

## 2018-07-29 DIAGNOSIS — E119 Type 2 diabetes mellitus without complications: Secondary | ICD-10-CM | POA: Diagnosis not present

## 2018-07-29 DIAGNOSIS — E785 Hyperlipidemia, unspecified: Principal | ICD-10-CM

## 2018-07-29 DIAGNOSIS — E1169 Type 2 diabetes mellitus with other specified complication: Secondary | ICD-10-CM

## 2018-07-29 LAB — CMP14+EGFR
ALT: 21 IU/L (ref 0–32)
AST: 23 IU/L (ref 0–40)
Albumin/Globulin Ratio: 2.1 (ref 1.2–2.2)
Albumin: 4.5 g/dL (ref 3.6–4.8)
Alkaline Phosphatase: 78 IU/L (ref 39–117)
BUN/Creatinine Ratio: 21 (ref 12–28)
BUN: 17 mg/dL (ref 8–27)
Bilirubin Total: 0.3 mg/dL (ref 0.0–1.2)
CALCIUM: 9.8 mg/dL (ref 8.7–10.3)
CO2: 24 mmol/L (ref 20–29)
CREATININE: 0.82 mg/dL (ref 0.57–1.00)
Chloride: 101 mmol/L (ref 96–106)
GFR, EST AFRICAN AMERICAN: 85 mL/min/{1.73_m2} (ref >59)
GFR, EST NON AFRICAN AMERICAN: 74 mL/min/{1.73_m2} (ref >59)
Globulin, Total: 2.1 g/dL (ref 1.5–4.5)
Glucose: 107 mg/dL — ABNORMAL HIGH (ref 65–99)
POTASSIUM: 4.8 mmol/L (ref 3.5–5.2)
Sodium: 142 mmol/L (ref 134–144)
TOTAL PROTEIN: 6.6 g/dL (ref 6.0–8.5)

## 2018-07-29 LAB — LIPID PANEL
CHOL/HDL RATIO: 2.7 ratio (ref 0.0–4.4)
CHOLESTEROL TOTAL: 174 mg/dL (ref 100–199)
HDL: 64 mg/dL (ref >39)
LDL Calculated: 59 mg/dL (ref 0–99)
TRIGLYCERIDES: 255 mg/dL — AB (ref 0–149)
VLDL Cholesterol Cal: 51 mg/dL — ABNORMAL HIGH (ref 5–40)

## 2018-07-29 LAB — BAYER DCA HB A1C WAIVED: HB A1C (BAYER DCA - WAIVED): 7.1 % — ABNORMAL HIGH (ref <7.0)

## 2018-08-01 ENCOUNTER — Encounter: Payer: Self-pay | Admitting: Family Medicine

## 2018-08-01 ENCOUNTER — Ambulatory Visit (INDEPENDENT_AMBULATORY_CARE_PROVIDER_SITE_OTHER): Payer: PPO | Admitting: Family Medicine

## 2018-08-01 VITALS — BP 119/67 | HR 74 | Temp 97.1°F | Ht 62.0 in | Wt 118.6 lb

## 2018-08-01 DIAGNOSIS — M720 Palmar fascial fibromatosis [Dupuytren]: Secondary | ICD-10-CM | POA: Diagnosis not present

## 2018-08-01 DIAGNOSIS — E785 Hyperlipidemia, unspecified: Secondary | ICD-10-CM

## 2018-08-01 DIAGNOSIS — E1169 Type 2 diabetes mellitus with other specified complication: Secondary | ICD-10-CM | POA: Diagnosis not present

## 2018-08-01 DIAGNOSIS — E119 Type 2 diabetes mellitus without complications: Secondary | ICD-10-CM

## 2018-08-01 MED ORDER — SIMVASTATIN 40 MG PO TABS: 40.0000 mg | ORAL_TABLET | Freq: Every day | ORAL | 3 refills | Status: DC

## 2018-08-01 MED ORDER — GLIPIZIDE ER 5 MG PO TB24: ORAL_TABLET | ORAL | 3 refills | Status: DC

## 2018-08-01 MED ORDER — METFORMIN HCL 1000 MG PO TABS: 1000.0000 mg | ORAL_TABLET | Freq: Two times a day (BID) | ORAL | 3 refills | Status: DC

## 2018-08-01 NOTE — Progress Notes (Signed)
BP 119/67   Pulse 74   Temp (!) 97.1 F (36.2 C) (Oral)   Ht _0  (1.575 m)   Wt 118 lb 9.6 oz (53.8 kg)   BMI 21.69 kg/m    Subjective:    Patient ID: Misty [name], female    DOB: 07/05/1951, 67 y.o.   MRN: [id number]  HPI: Misty [name] is a 67 y.o. female presenting on 08/01/2018 for Diabetes (3 month follow up) and Hyperlipidemia   HPI Type 2 diabetes mellitus Patient comes in today for recheck of his diabetes. Patient has been currently taking metformin and glipizide, patient has been having some upset stomach with metformin, will have her cut into half a little bit and may consider extended release versus Janumet in the future.. Patient is not currently on an ACE inhibitor/ARB. Patient has seen an ophthalmologist this year. Patient denies any issues with their feet.   Hyperlipidemia Patient is coming in for recheck of his hyperlipidemia. The patient is currently taking simvastatin. They deny any issues with myalgias or history of liver damage from it. They deny any focal numbness or weakness or chest pain.   Hands Knot and pain Patient complains of some knots in the palm of her hand, she mainly has a large one on the left fourth ray for tendon.  She says is been bothering more over the past month.  She denies any loss of range of motion or any popping or catching of that finger but she does have a palpable knot or nodule there.  Relevant past medical, surgical, family and social history reviewed and updated as indicated. Interim medical history since our last visit reviewed. Allergies and medications reviewed and updated.  Review of Systems  Constitutional: Negative for chills and fever.  HENT: Negative for congestion, ear discharge and ear pain.   Eyes: Negative for visual disturbance.  Respiratory: Negative for chest tightness and shortness of breath.   Cardiovascular: Negative for chest pain and leg swelling.  Musculoskeletal: Positive for arthralgias and  myalgias. Negative for back pain and gait problem.  Skin: Negative for rash.  Neurological: Negative for light-headedness and headaches.  Psychiatric/Behavioral: Negative for agitation and behavioral problems.  All other systems reviewed and are negative.   Per HPI unless specifically indicated above   Allergies as of 08/01/2018      Reactions   Atorvastatin Other (See Comments)   MUSCLE PAIN       Medication List        Accurate as of 08/01/18 10:58 AM. Always use your most recent med list.          blood glucose meter kit and supplies Kit Dispense based on patient and insurance preference. Use up to four times daily as directed. (FOR ICD-9 250.00, 250.01).   glipiZIDE 5 MG 24 hr tablet Commonly known as:  GLUCOTROL XL TAKE ONE TABLET BY MOUTH DAIL Y   metFORMIN 1000 MG tablet Commonly known as:  GLUCOPHAGE Take 1 tablet (1,000 mg total) by mouth 2 (two) times daily with a meal.   ONE TOUCH ULTRA TEST test strip Generic drug:  glucose blood USE 4 TIMES DAILY AS DRECTED   simvastatin 40 MG tablet Commonly known as:  ZOCOR Take 1 tablet (40 mg total) by mouth daily.   VISION FORMULA 2 PO Take 1 capsule by mouth at bedtime.          Objective:    BP 119/67   Pulse 74   Temp (!) 97.1  F (36.2 C) (Oral)   Ht _0  (1.575 m)   Wt 118 lb 9.6 oz (53.8 kg)   BMI 21.69 kg/m   Wt Readings from Last 3 Encounters:  08/01/18 118 lb 9.6 oz (53.8 kg)  04/29/18 117 lb (53.1 kg)  04/01/18 115 lb 12.8 oz (52.5 kg)    Physical Exam  Constitutional: She is oriented to person, place, and time. She appears well-developed and well-nourished. No distress.  Eyes: Conjunctivae are normal.  Neck: Neck supple. No thyromegaly present.  Cardiovascular: Normal rate, regular rhythm, normal heart sounds and intact distal pulses.  No murmur heard. Pulmonary/Chest: Effort normal and breath sounds normal. No respiratory distress. She has no wheezes.  Musculoskeletal: Normal range of  motion. She exhibits no edema.       Left hand: Normal sensation noted. Normal strength noted.       Hands: Lymphadenopathy:    She has no cervical adenopathy.  Neurological: She is alert and oriented to person, place, and time. Coordination normal.  Skin: Skin is warm and dry. No rash noted. She is not diaphoretic.  Psychiatric: She has a normal mood and affect. Her behavior is normal.  Nursing note and vitals reviewed.   Results for orders placed or performed in visit on 07/29/18  Lipid panel  Result Value Ref Range   Cholesterol, Total 174 100 - 199 mg/dL   Triglycerides 255 (H) 0 - 149 mg/dL   HDL 64 >39 mg/dL   VLDL Cholesterol Cal 51 (H) 5 - 40 mg/dL   LDL Calculated 59 0 - 99 mg/dL   Chol/HDL Ratio 2.7 0.0 - 4.4 ratio  CMP14+EGFR  Result Value Ref Range   Glucose 107 (H) 65 - 99 mg/dL   BUN 17 8 - 27 mg/dL   Creatinine, Ser 0.82 0.57 - 1.00 mg/dL   GFR calc non Af Amer 74 >59 mL/min/1.73   GFR calc Af Amer 85 >59 mL/min/1.73   BUN/Creatinine Ratio 21 12 - 28   Sodium 142 134 - 144 mmol/L   Potassium 4.8 3.5 - 5.2 mmol/L   Chloride 101 96 - 106 mmol/L   CO2 24 20 - 29 mmol/L   Calcium 9.8 8.7 - 10.3 mg/dL   Total Protein 6.6 6.0 - 8.5 g/dL   Albumin 4.5 3.6 - 4.8 g/dL   Globulin, Total 2.1 1.5 - 4.5 g/dL   Albumin/Globulin Ratio 2.1 1.2 - 2.2   Bilirubin Total 0.3 0.0 - 1.2 mg/dL   Alkaline Phosphatase 78 39 - 117 IU/L   AST 23 0 - 40 IU/L   ALT 21 0 - 32 IU/L  Bayer DCA Hb A1c Waived  Result Value Ref Range   HB A1C (BAYER DCA - WAIVED) 7.1 (H) <7.0 %      Assessment & Plan:   Problem List Items Addressed This Visit      Endocrine   Type 2 diabetes mellitus (HCC) - Primary   Relevant Medications   simvastatin (ZOCOR) 40 MG tablet   glipiZIDE (GLUCOTROL XL) 5 MG 24 hr tablet   metFORMIN (GLUCOPHAGE) 1000 MG tablet   Other Relevant Orders   Bayer DCA Hb A1c Waived   CBC with Differential/Platelet   CMP14+EGFR   Lipid panel   Bayer DCA Hb A1c Waived    Hyperlipidemia associated with type 2 diabetes mellitus (HCC)   Relevant Medications   simvastatin (ZOCOR) 40 MG tablet   glipiZIDE (GLUCOTROL XL) 5 MG 24 hr tablet   metFORMIN (GLUCOPHAGE) 1000 MG  tablet   Other Relevant Orders   Bayer DCA Hb A1c Waived   CBC with Differential/Platelet   CMP14+EGFR   Lipid panel   Bayer DCA Hb A1c Waived    Other Visit Diagnoses    Dupuytren's contracture       Patient shows tendon tightening but no loss of range of motion, will monitor for now with anti-inflammatories       Follow up plan: Return in about 3 months (around 10/31/2018), or if symptoms worsen or fail to improve, for Diabetes and hypertension recheck.  Counseling provided for all of the vaccine components Orders Placed This Encounter  Procedures  . Bayer DCA Hb A1c Waived  . CBC with Differential/Platelet  . CMP14+EGFR  . Lipid panel  . Bayer [facility] Hb A1c [place], MD [facility] 08/01/2018, 10:58 AM

## 2018-10-26 ENCOUNTER — Other Ambulatory Visit: Payer: PPO

## 2018-10-26 DIAGNOSIS — E785 Hyperlipidemia, unspecified: Principal | ICD-10-CM

## 2018-10-26 DIAGNOSIS — E1169 Type 2 diabetes mellitus with other specified complication: Secondary | ICD-10-CM

## 2018-10-26 DIAGNOSIS — E119 Type 2 diabetes mellitus without complications: Secondary | ICD-10-CM

## 2018-10-26 LAB — LIPID PANEL
CHOL/HDL RATIO: 2.3 ratio (ref 0.0–4.4)
CHOLESTEROL TOTAL: 181 mg/dL (ref 100–199)
HDL: 78 mg/dL (ref >39)
LDL CALC: 77 mg/dL (ref 0–99)
TRIGLYCERIDES: 130 mg/dL (ref 0–149)
VLDL CHOLESTEROL CAL: 26 mg/dL (ref 5–40)

## 2018-10-26 LAB — CBC WITH DIFFERENTIAL/PLATELET
Basophils Absolute: 0 10*3/uL (ref 0.0–0.2)
Basos: 1 %
EOS (ABSOLUTE): 0 10*3/uL (ref 0.0–0.4)
Eos: 1 %
Hematocrit: 40.4 % (ref 34.0–46.6)
Hemoglobin: 13.2 g/dL (ref 11.1–15.9)
Immature Grans (Abs): 0 10*3/uL (ref 0.0–0.1)
Immature Granulocytes: 0 %
Lymphocytes Absolute: 3.1 10*3/uL (ref 0.7–3.1)
Lymphs: 58 %
MCH: 32.6 pg (ref 26.6–33.0)
MCHC: 32.7 g/dL (ref 31.5–35.7)
MCV: 100 fL — ABNORMAL HIGH (ref 79–97)
Monocytes Absolute: 0.6 10*3/uL (ref 0.1–0.9)
Monocytes: 11 %
Neutrophils Absolute: 1.6 10*3/uL (ref 1.4–7.0)
Neutrophils: 29 %
Platelets: 236 10*3/uL (ref 150–450)
RBC: 4.05 x10E6/uL (ref 3.77–5.28)
RDW: 12.2 % — ABNORMAL LOW (ref 12.3–15.4)
WBC: 5.3 10*3/uL (ref 3.4–10.8)

## 2018-10-26 LAB — CMP14+EGFR
A/G RATIO: 2.2 (ref 1.2–2.2)
ALK PHOS: 72 IU/L (ref 39–117)
ALT: 15 IU/L (ref 0–32)
AST: 20 IU/L (ref 0–40)
Albumin: 4.4 g/dL (ref 3.6–4.8)
BILIRUBIN TOTAL: 0.4 mg/dL (ref 0.0–1.2)
BUN/Creatinine Ratio: 16 (ref 12–28)
BUN: 15 mg/dL (ref 8–27)
CHLORIDE: 102 mmol/L (ref 96–106)
CO2: 24 mmol/L (ref 20–29)
Calcium: 9.3 mg/dL (ref 8.7–10.3)
Creatinine, Ser: 0.92 mg/dL (ref 0.57–1.00)
GFR calc Af Amer: 74 mL/min/{1.73_m2} (ref >59)
GFR, EST NON AFRICAN AMERICAN: 64 mL/min/{1.73_m2} (ref >59)
GLOBULIN, TOTAL: 2 g/dL (ref 1.5–4.5)
Glucose: 139 mg/dL — ABNORMAL HIGH (ref 65–99)
POTASSIUM: 4.5 mmol/L (ref 3.5–5.2)
SODIUM: 141 mmol/L (ref 134–144)
Total Protein: 6.4 g/dL (ref 6.0–8.5)

## 2018-10-26 LAB — BAYER DCA HB A1C WAIVED: HB A1C (BAYER DCA - WAIVED): 7.3 % — ABNORMAL HIGH (ref <7.0)

## 2018-10-31 ENCOUNTER — Ambulatory Visit (INDEPENDENT_AMBULATORY_CARE_PROVIDER_SITE_OTHER): Payer: PPO | Admitting: Family Medicine

## 2018-10-31 ENCOUNTER — Encounter: Payer: Self-pay | Admitting: Family Medicine

## 2018-10-31 VITALS — BP 145/84 | HR 69 | Temp 97.0°F | Ht 62.0 in | Wt 118.8 lb

## 2018-10-31 DIAGNOSIS — E1169 Type 2 diabetes mellitus with other specified complication: Secondary | ICD-10-CM | POA: Diagnosis not present

## 2018-10-31 DIAGNOSIS — J320 Chronic maxillary sinusitis: Secondary | ICD-10-CM

## 2018-10-31 DIAGNOSIS — E785 Hyperlipidemia, unspecified: Secondary | ICD-10-CM

## 2018-10-31 MED ORDER — METFORMIN HCL ER 500 MG PO TB24: 1000.0000 mg | ORAL_TABLET | Freq: Two times a day (BID) | ORAL | 3 refills | Status: DC

## 2018-10-31 NOTE — Progress Notes (Signed)
BP (!) 145/84   Pulse 69   Temp (!) 97 F (36.1 C) (Oral)   Ht 5' 2"  (1.575 m)   Wt 118 lb 12.8 oz (53.9 kg)   BMI 21.73 kg/m    Subjective:    Patient ID: Misty [name], female    DOB: 1951/05/07, 67 y.o.   MRN: [id number]  HPI: Misty [name] is a 67 y.o. female presenting on 10/31/2018 for Diabetes (3 month follow up); Hyperlipidemia; and Sinusitis (Runny nose x 3 weeks - OTC sudafed)   HPI Type 2 diabetes mellitus Patient comes in today for recheck of his diabetes. Patient has been currently taking metformin and glipizide. Patient is not currently on an ACE inhibitor/ARB. Patient has seen an ophthalmologist this year. Patient denies any issues with their feet.   Hyperlipidemia Patient is coming in for recheck of his hyperlipidemia. The patient is currently taking simvastatin. They deny any issues with myalgias or history of liver damage from it. They deny any focal numbness or weakness or chest pain.   Chronic sinuses, the Sudafed just not working anymore and patient has been having chronic sinuses and would like to know if anything else can be sent in different for her.  She says she has tried the Flonase once in the distant past but cannot recall how much or how little it worked for her.  She is agreeable to trying that again.  She denies any fevers or chills or shortness of breath or wheezing or headaches today.  Relevant past medical, surgical, family and social history reviewed and updated as indicated. Interim medical history since our last visit reviewed. Allergies and medications reviewed and updated.  Review of Systems  Constitutional: Negative for chills and fever.  HENT: Positive for congestion and sneezing. Negative for ear discharge, ear pain, sinus pressure, sinus pain and sore throat.   Eyes: Negative for redness and visual disturbance.  Respiratory: Negative for cough, chest tightness and shortness of breath.   Cardiovascular: Negative for chest pain  and leg swelling.  Genitourinary: Negative for difficulty urinating and dysuria.  Musculoskeletal: Negative for back pain and gait problem.  Skin: Negative for rash.  Neurological: Negative for light-headedness and headaches.  Psychiatric/Behavioral: Negative for agitation and behavioral problems.  All other systems reviewed and are negative.   Per HPI unless specifically indicated above   Allergies as of 10/31/2018      Reactions   Atorvastatin Other (See Comments)   MUSCLE PAIN       Medication List       Accurate as of October 31, 2018  1:15 PM. Always use your most recent med list.        blood glucose meter kit and supplies Kit Dispense based on patient and insurance preference. Use up to four times daily as directed. (FOR ICD-9 250.00, 250.01).   glipiZIDE 5 MG 24 hr tablet Commonly known as:  GLUCOTROL XL TAKE ONE TABLET BY MOUTH DAIL Y   metFORMIN 500 MG 24 hr tablet Commonly known as:  GLUCOPHAGE XR Take 2 tablets (1,000 mg total) by mouth 2 (two) times daily.   ONE TOUCH ULTRA TEST test strip Generic drug:  glucose blood USE 4 TIMES DAILY AS DRECTED   simvastatin 40 MG tablet Commonly known as:  ZOCOR Take 1 tablet (40 mg total) by mouth daily.   VISION FORMULA 2 PO Take 1 capsule by mouth at bedtime.          Objective:  BP (!) 145/84   Pulse 69   Temp (!) 97 F (36.1 C) (Oral)   Ht 5' 2"  (1.575 m)   Wt 118 lb 12.8 oz (53.9 kg)   BMI 21.73 kg/m   Wt Readings from Last 3 Encounters:  10/31/18 118 lb 12.8 oz (53.9 kg)  08/01/18 118 lb 9.6 oz (53.8 kg)  04/29/18 117 lb (53.1 kg)    Physical Exam Vitals signs reviewed.  Constitutional:      General: She is not in acute distress.    Appearance: She is well-developed. She is not diaphoretic.  HENT:     Right Ear: Tympanic membrane, ear canal and external ear normal.     Left Ear: Tympanic membrane, ear canal and external ear normal.     Nose: No mucosal edema or rhinorrhea.     Right  Sinus: No maxillary sinus tenderness or frontal sinus tenderness.     Left Sinus: No maxillary sinus tenderness or frontal sinus tenderness.     Mouth/Throat:     Pharynx: Uvula midline. No oropharyngeal exudate or posterior oropharyngeal erythema.     Tonsils: No tonsillar abscesses.  Eyes:     Conjunctiva/sclera: Conjunctivae normal.  Cardiovascular:     Rate and Rhythm: Normal rate and regular rhythm.     Heart sounds: Normal heart sounds. No murmur.  Pulmonary:     Effort: Pulmonary effort is normal. No respiratory distress.     Breath sounds: Normal breath sounds. No wheezing.  Musculoskeletal: Normal range of motion.        General: No tenderness.  Skin:    General: Skin is warm and dry.     Findings: No rash.  Neurological:     Mental Status: She is alert and oriented to person, place, and time.     Coordination: Coordination normal.  Psychiatric:        Behavior: Behavior normal.         Assessment & Plan:   Problem List Items Addressed This Visit      Respiratory   Sinusitis, maxillary, chronic     Endocrine   Type 2 diabetes mellitus (HCC) - Primary   Relevant Medications   metFORMIN (GLUCOPHAGE XR) 500 MG 24 hr tablet   Other Relevant Orders   Bayer DCA Hb A1c Waived   BMP8+EGFR   Hyperlipidemia associated with type 2 diabetes mellitus (HCC)   Relevant Medications   metFORMIN (GLUCOPHAGE XR) 500 MG 24 hr tablet      We will switch her to metformin extended release because she has not been able to tolerate taking more of the metformin and her A1c is running about 7.3.  She will try and see if she can take more the metformin with the extended release without having the diarrhea issues. Follow up plan: Return in about 3 months (around 01/30/2019), or if symptoms worsen or fail to improve, for Diabetes recheck.  Counseling provided for all of the vaccine components Orders Placed This Encounter  Procedures  . Bayer DCA Hb A1c Waived  . BMP8+EGFR     Caryl Pina, MD [facility] 10/31/2018, 1:15 PM

## 2018-12-01 ENCOUNTER — Ambulatory Visit (INDEPENDENT_AMBULATORY_CARE_PROVIDER_SITE_OTHER): Payer: PPO | Admitting: *Deleted

## 2018-12-01 ENCOUNTER — Encounter: Payer: Self-pay | Admitting: *Deleted

## 2018-12-01 VITALS — BP 125/73 | HR 83 | Ht 62.0 in | Wt 119.0 lb

## 2018-12-01 DIAGNOSIS — Z0000 Encounter for general adult medical examination without abnormal findings: Secondary | ICD-10-CM

## 2018-12-01 NOTE — Patient Instructions (Addendum)
Please work on your goal:   Goals Addressed            This Visit's Progress   . Have 3 meals a day   Improving    You have been scheduled for a mammogram here at [facility] on 12/07/2018 at 3:10 pm.   Please remember to schedule your eye exam.   Please follow up with Dr. Warrick Parisian as scheduled.  Thank you for coming in for your Annual Wellness Visit today!!  Preventive Care 65 Years and Older, Female Preventive care refers to lifestyle choices and visits with your health care provider that can promote health and wellness. What does preventive care include?  A yearly physical exam. This is also called an annual well check.  Dental exams once or twice a year.  Routine eye exams. Ask your health care provider how often you should have your eyes checked.  Personal lifestyle choices, including: ? Daily care of your teeth and gums. ? Regular physical activity. ? Eating a healthy diet. ? Avoiding tobacco and drug use. ? Limiting alcohol use. ? Practicing safe sex. ? Taking low-dose aspirin every day. ? Taking vitamin and mineral supplements as recommended by your health care provider. What happens during an annual well check? The services and screenings done by your health care provider during your annual well check will depend on your age, overall health, lifestyle risk factors, and family history of disease. Counseling Your health care provider may ask you questions about your:  Alcohol use.  Tobacco use.  Drug use.  Emotional well-being.  Home and relationship well-being.  Sexual activity.  Eating habits.  History of falls.  Memory and ability to understand (cognition).  Work and work Statistician.  Reproductive health.  Screening You may have the following tests or measurements:  Height, weight, and BMI.  Blood pressure.  Lipid and cholesterol levels. These may be checked every 5 years, or more frequently if you are over 101 years old.  Skin  check.  Lung cancer screening. You may have this screening every year starting at age 10 if you have a 30-pack-year history of smoking and currently smoke or have quit within the past 15 years.  Colorectal cancer screening. All adults should have this screening starting at age 63 and continuing until age 78. You will have tests every 1-10 years, depending on your results and the type of screening test. People at increased risk should start screening at an earlier age. Screening tests may include: ? Guaiac-based fecal occult blood testing. ? Fecal immunochemical test (FIT). ? Stool DNA test. ? Virtual colonoscopy. ? Sigmoidoscopy. During this test, a flexible tube with a tiny camera (sigmoidoscope) is used to examine your rectum and lower colon. The sigmoidoscope is inserted through your anus into your rectum and lower colon. ? Colonoscopy. During this test, a long, thin, flexible tube with a tiny camera (colonoscope) is used to examine your entire colon and rectum.  Hepatitis C blood test.  Hepatitis B blood test.  Sexually transmitted disease (STD) testing.  Diabetes screening. This is done by checking your blood sugar (glucose) after you have not eaten for a while (fasting). You may have this done every 1-3 years.  Bone density scan. This is done to screen for osteoporosis. You may have this done starting at age 80.  Mammogram. This may be done every 1-2 years. Talk to your health care provider about how often you should have regular mammograms. Talk with your health care provider about your  test results, treatment options, and if necessary, the need for more tests. Vaccines Your health care provider may recommend certain vaccines, such as:  Influenza vaccine. This is recommended every year.  Tetanus, diphtheria, and acellular pertussis (Tdap, Td) vaccine. You may need a Td booster every 10 years.  Varicella vaccine. You may need this if you have not been vaccinated.  Zoster  vaccine. You may need this after age 84.  Measles, mumps, and rubella (MMR) vaccine. You may need at least one dose of MMR if you were born in 1957 or later. You may also need a second dose.  Pneumococcal 13-valent conjugate (PCV13) vaccine. One dose is recommended after age 26.  Pneumococcal polysaccharide (PPSV23) vaccine. One dose is recommended after age 33.  Meningococcal vaccine. You may need this if you have certain conditions.  Hepatitis A vaccine. You may need this if you have certain conditions or if you travel or work in places where you may be exposed to hepatitis A.  Hepatitis B vaccine. You may need this if you have certain conditions or if you travel or work in places where you may be exposed to hepatitis B.  Haemophilus influenzae type b (Hib) vaccine. You may need this if you have certain conditions. Talk to your health care provider about which screenings and vaccines you need and how often you need them. This information is not intended to replace advice given to you by your health care provider. Make sure you discuss any questions you have with your health care provider. Document Released: 11/22/2015 Document Revised: 12/16/2017 Document Reviewed: 08/27/2015 Elsevier Interactive Patient Education  2019 [place].  Diabetes Mellitus and Nutrition, Adult When you have diabetes (diabetes mellitus), it is very important to have healthy eating habits because your blood sugar (glucose) levels are greatly affected by what you eat and drink. Eating healthy foods in the appropriate amounts, at about the same times every day, can help you:  Control your blood glucose.  Lower your risk of heart disease.  Improve your blood pressure.  Reach or maintain a healthy weight. Every person with diabetes is different, and each person has different needs for a meal plan. Your health care provider may recommend that you work with a diet and nutrition specialist (dietitian) to make a  meal plan that is best for you. Your meal plan may vary depending on factors such as:  The calories you need.  The medicines you take.  Your weight.  Your blood glucose, blood pressure, and cholesterol levels.  Your activity level.  Other health conditions you have, such as heart or kidney disease. How do carbohydrates affect me? Carbohydrates, also called carbs, affect your blood glucose level more than any other type of food. Eating carbs naturally raises the amount of glucose in your blood. Carb counting is a method for keeping track of how many carbs you eat. Counting carbs is important to keep your blood glucose at a healthy level, especially if you use insulin or take certain oral diabetes medicines. It is important to know how many carbs you can safely have in each meal. This is different for every person. Your dietitian can help you calculate how many carbs you should have at each meal and for each snack. Foods that contain carbs include:  Bread, cereal, rice, pasta, and crackers.  Potatoes and corn.  Peas, beans, and lentils.  Milk and yogurt.  Fruit and juice.  Desserts, such as cakes, cookies, ice cream, and candy. How does alcohol  affect me? Alcohol can cause a sudden decrease in blood glucose (hypoglycemia), especially if you use insulin or take certain oral diabetes medicines. Hypoglycemia can be a life-threatening condition. Symptoms of hypoglycemia (sleepiness, dizziness, and confusion) are similar to symptoms of having too much alcohol. If your health care provider says that alcohol is safe for you, follow these guidelines:  Limit alcohol intake to no more than 1 drink per day for nonpregnant women and 2 drinks per day for men. One drink equals 12 oz of beer, 5 oz of wine, or 1 oz of hard liquor.  Do not drink on an empty stomach.  Keep yourself hydrated with water, diet soda, or unsweetened iced tea.  Keep in mind that regular soda, juice, and other mixers  may contain a lot of sugar and must be counted as carbs. What are tips for following this plan?  Reading food labels  Start by checking the serving size on the "Nutrition Facts" label of packaged foods and drinks. The amount of calories, carbs, fats, and other nutrients listed on the label is based on one serving of the item. Many items contain more than one serving per package.  Check the total grams (g) of carbs in one serving. You can calculate the number of servings of carbs in one serving by dividing the total carbs by 15. For example, if a food has 30 g of total carbs, it would be equal to 2 servings of carbs.  Check the number of grams (g) of saturated and trans fats in one serving. Choose foods that have low or no amount of these fats.  Check the number of milligrams (mg) of salt (sodium) in one serving. Most people should limit total sodium intake to less than 2,300 mg per day.  Always check the nutrition information of foods labeled as "low-fat" or "nonfat". These foods may be higher in added sugar or refined carbs and should be avoided.  Talk to your dietitian to identify your daily goals for nutrients listed on the label. Shopping  Avoid buying canned, premade, or processed foods. These foods tend to be high in fat, sodium, and added sugar.  Shop around the outside edge of the grocery store. This includes fresh fruits and vegetables, bulk grains, fresh meats, and fresh dairy. Cooking  Use low-heat cooking methods, such as baking, instead of high-heat cooking methods like deep frying.  Cook using healthy oils, such as olive, canola, or sunflower oil.  Avoid cooking with butter, cream, or high-fat meats. Meal planning  Eat meals and snacks regularly, preferably at the same times every day. Avoid going long periods of time without eating.  Eat foods high in fiber, such as fresh fruits, vegetables, beans, and whole grains. Talk to your dietitian about how many servings of  carbs you can eat at each meal.  Eat 4-6 ounces (oz) of lean protein each day, such as lean meat, chicken, fish, eggs, or tofu. One oz of lean protein is equal to: ? 1 oz of meat, chicken, or fish. ? 1 egg. ?  cup of tofu.  Eat some foods each day that contain healthy fats, such as avocado, nuts, seeds, and fish. Lifestyle  Check your blood glucose regularly.  Exercise regularly as told by your health care provider. This may include: ? 150 minutes of moderate-intensity or vigorous-intensity exercise each week. This could be brisk walking, biking, or water aerobics. ? Stretching and doing strength exercises, such as yoga or weightlifting, at least 2 times a  week.  Take medicines as told by your health care provider.  Do not use any products that contain nicotine or tobacco, such as cigarettes and e-cigarettes. If you need help quitting, ask your health care provider.  Work with a Social worker or diabetes educator to identify strategies to manage stress and any emotional and social challenges. Questions to ask a health care provider  Do I need to meet with a diabetes educator?  Do I need to meet with a dietitian?  What number can I call if I have questions?  When are the best times to check my blood glucose? Where to find more information:  American Diabetes Association: diabetes.org  Academy of Nutrition and Dietetics: www.eatright.[employer] of Diabetes and Digestive and Kidney Diseases (NIH): [employer] Summary  A healthy meal plan will help you control your blood glucose and maintain a healthy lifestyle.  Working with a diet and nutrition specialist (dietitian) can help you make a meal plan that is best for you.  Keep in mind that carbohydrates (carbs) and alcohol have immediate effects on your blood glucose levels. It is important to count carbs and to use alcohol carefully. This information is not intended to replace advice given to you by your health  care provider. Make sure you discuss any questions you have with your health care provider. Document Released: 07/23/2005 Document Revised: 05/26/2017 Document Reviewed: 11/30/2016 Elsevier Interactive Patient Education  2019 [employer].
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
## 2018-12-07 ENCOUNTER — Ambulatory Visit: Payer: PPO | Admitting: Family Medicine

## 2018-12-07 DIAGNOSIS — Z1231 Encounter for screening mammogram for malignant neoplasm of breast: Secondary | ICD-10-CM | POA: Diagnosis not present

## 2018-12-07 LAB — HM MAMMOGRAPHY

## 2019-01-06 ENCOUNTER — Other Ambulatory Visit: Payer: PPO

## 2019-01-06 DIAGNOSIS — E1169 Type 2 diabetes mellitus with other specified complication: Secondary | ICD-10-CM | POA: Diagnosis not present

## 2019-01-06 LAB — BAYER DCA HB A1C WAIVED: HB A1C (BAYER DCA - WAIVED): 7 % — ABNORMAL HIGH (ref <7.0)

## 2019-01-07 LAB — BMP8+EGFR
BUN / CREAT RATIO: 13 (ref 12–28)
BUN: 11 mg/dL (ref 8–27)
CO2: 24 mmol/L (ref 20–29)
Calcium: 9.3 mg/dL (ref 8.7–10.3)
Chloride: 103 mmol/L (ref 96–106)
Creatinine, Ser: 0.84 mg/dL (ref 0.57–1.00)
GFR calc Af Amer: 83 mL/min/{1.73_m2} (ref >59)
GFR calc non Af Amer: 72 mL/min/{1.73_m2} (ref >59)
Glucose: 111 mg/dL — ABNORMAL HIGH (ref 65–99)
Potassium: 4.4 mmol/L (ref 3.5–5.2)
SODIUM: 142 mmol/L (ref 134–144)

## 2019-01-09 ENCOUNTER — Ambulatory Visit (INDEPENDENT_AMBULATORY_CARE_PROVIDER_SITE_OTHER): Payer: PPO | Admitting: Family Medicine

## 2019-01-09 ENCOUNTER — Encounter: Payer: Self-pay | Admitting: Family Medicine

## 2019-01-09 VITALS — BP 127/79 | HR 80 | Temp 97.1°F | Ht 62.0 in | Wt 118.8 lb

## 2019-01-09 DIAGNOSIS — J320 Chronic maxillary sinusitis: Secondary | ICD-10-CM | POA: Diagnosis not present

## 2019-01-09 DIAGNOSIS — E785 Hyperlipidemia, unspecified: Secondary | ICD-10-CM | POA: Diagnosis not present

## 2019-01-09 DIAGNOSIS — E1169 Type 2 diabetes mellitus with other specified complication: Secondary | ICD-10-CM | POA: Diagnosis not present

## 2019-01-09 NOTE — Progress Notes (Signed)
BP 127/79   Pulse 80   Temp (!) 97.1 F (36.2 C) (Oral)   Ht 5' 2"  (1.575 m)   Wt 118 lb 12.8 oz (53.9 kg)   BMI 21.73 kg/m    Subjective:    Patient ID: Misty [name], female    DOB: 03/25/1951, 68 y.o.   MRN: [id number]  HPI: Misty [name] is a 68 y.o. female presenting on 01/09/2019 for Hyperlipidemia; Diabetes; and Sinus Problem   HPI Type 2 diabetes mellitus Patient comes in today for recheck of his diabetes. Patient has been currently taking metformin and glipizide. Patient is not currently on an ACE inhibitor/ARB because of hypotension previously. Patient has not seen an ophthalmologist this year. Patient denies any issues with their feet.   Hyperlipidemia Patient is coming in for recheck of his hyperlipidemia. The patient is currently taking a statin. They deny any issues with myalgias or history of liver damage from it. They deny any focal numbness or weakness or chest pain.   Chronic sinus congestion Patient has chronic sinus congestion and she feels like is starting to flareup again.  She says it is her usual allergies and sinus pressure.  She has not been using any of her nasal sprays or anything over-the-counter at this point, recommended for her to go ahead and start using them.  She denies any fevers or chills or shortness of breath or wheezing.  Relevant past medical, surgical, family and social history reviewed and updated as indicated. Interim medical history since our last visit reviewed. Allergies and medications reviewed and updated.  Review of Systems  Constitutional: Negative for chills and fever.  HENT: Positive for congestion, sinus pressure, sneezing and sore throat. Negative for ear discharge, ear pain, postnasal drip and rhinorrhea.   Eyes: Negative for pain, redness and visual disturbance.  Respiratory: Negative for cough, chest tightness and shortness of breath.   Cardiovascular: Negative for chest pain and leg swelling.  Genitourinary:  Negative for difficulty urinating and dysuria.  Musculoskeletal: Negative for back pain and gait problem.  Skin: Negative for rash.  Neurological: Negative for light-headedness and headaches.  Psychiatric/Behavioral: Negative for agitation and behavioral problems.  All other systems reviewed and are negative.   Per HPI unless specifically indicated above   Allergies as of 01/09/2019      Reactions   Atorvastatin Other (See Comments)   MUSCLE PAIN       Medication List       Accurate as of January 09, 2019  8:32 AM. Always use your most recent med list.        blood glucose meter kit and supplies Kit Dispense based on patient and insurance preference. Use up to four times daily as directed. (FOR ICD-9 250.00, 250.01).   glipiZIDE 5 MG 24 hr tablet Commonly known as:  GLUCOTROL XL TAKE ONE TABLET BY MOUTH DAIL Y   metFORMIN 500 MG 24 hr tablet Commonly known as:  GLUCOPHAGE XR Take 2 tablets (1,000 mg total) by mouth 2 (two) times daily.   ONE TOUCH ULTRA TEST test strip Generic drug:  glucose blood USE 4 TIMES DAILY AS DRECTED   simvastatin 40 MG tablet Commonly known as:  ZOCOR Take 1 tablet (40 mg total) by mouth daily.   VISION FORMULA 2 PO Take 1 capsule by mouth at bedtime.          Objective:    BP 127/79   Pulse 80   Temp (!) 97.1 F (36.2 C) (Oral)  Ht 5' 2"  (1.575 m)   Wt 118 lb 12.8 oz (53.9 kg)   BMI 21.73 kg/m   Wt Readings from Last 3 Encounters:  01/09/19 118 lb 12.8 oz (53.9 kg)  12/01/18 119 lb (54 kg)  10/31/18 118 lb 12.8 oz (53.9 kg)    Physical Exam Vitals signs and nursing note reviewed.  Constitutional:      General: She is not in acute distress.    Appearance: She is well-developed. She is not diaphoretic.  HENT:     Right Ear: Tympanic membrane normal.     Left Ear: Tympanic membrane normal.     Nose: Nose normal. No congestion or rhinorrhea.  Eyes:     Conjunctiva/sclera: Conjunctivae normal.  Cardiovascular:     Rate  and Rhythm: Normal rate and regular rhythm.     Heart sounds: Normal heart sounds. No murmur.  Pulmonary:     Effort: Pulmonary effort is normal. No respiratory distress.     Breath sounds: Normal breath sounds. No wheezing.  Musculoskeletal: Normal range of motion.        General: No tenderness.  Skin:    General: Skin is warm and dry.     Findings: No rash.  Neurological:     Mental Status: She is alert and oriented to person, place, and time.     Coordination: Coordination normal.  Psychiatric:        Behavior: Behavior normal.         Assessment & Plan:   Problem List Items Addressed This Visit      Respiratory   Sinusitis, maxillary, chronic     Endocrine   Type 2 diabetes mellitus ([place])   Relevant Orders   CBC with Differential/Platelet   CMP14+EGFR   Bayer DCA Hb A1c Waived   Hyperlipidemia associated with type 2 diabetes mellitus ([place]) - Primary   Relevant Orders   Lipid panel      Continue metformin 1000 in the a.m. and 500 in the p.m. and glipizide and simvastatin Recheck blood work in 3 months  Patient has chronic sinus issues but will continue her current medication and treatment for that will let us know if it worsens.  Patient says she had a mammogram last month and we will try and request that Follow up plan: Return in about 3 months (around 04/11/2019), or if symptoms worsen or fail to improve, for Diabetes recheck.  Counseling provided for all of the vaccine components No orders of the defined types were placed in this encounter.   Caryl Pina, MD [facility] 01/09/2019, 8:32 AM

## 2019-01-19 ENCOUNTER — Other Ambulatory Visit: Payer: Self-pay | Admitting: *Deleted

## 2019-01-19 MED ORDER — GLUCOSE BLOOD VI STRP: ORAL_STRIP | 3 refills | Status: DC

## 2019-04-12 ENCOUNTER — Encounter: Payer: Self-pay | Admitting: Family Medicine

## 2019-04-12 ENCOUNTER — Ambulatory Visit (INDEPENDENT_AMBULATORY_CARE_PROVIDER_SITE_OTHER): Payer: PPO | Admitting: Family Medicine

## 2019-04-12 ENCOUNTER — Other Ambulatory Visit: Payer: Self-pay

## 2019-04-12 DIAGNOSIS — E1169 Type 2 diabetes mellitus with other specified complication: Secondary | ICD-10-CM

## 2019-04-12 DIAGNOSIS — E785 Hyperlipidemia, unspecified: Secondary | ICD-10-CM

## 2019-04-12 NOTE — Progress Notes (Signed)
Virtual Visit via telephone Note  I connected with Misty [name] on 04/12/19 at [phone] by telephone and verified that I am speaking with the correct person using two identifiers. Misty [name] is currently located at home and no other people are currently with her during visit. The provider, Fransisca Kaufmann Dettinger, MD is located in their office at time of visit.  Call ended at [place]  I discussed the limitations, risks, security and privacy concerns of performing an evaluation and management service by telephone and the availability of in person appointments. I also discussed with the patient that there may be a patient responsible charge related to this service. The patient expressed understanding and agreed to proceed.   History and Present Illness: Type 2 diabetes mellitus Patient comes in today for recheck of his diabetes. Patient has been currently taking metformin and glipizide and bs 98-137. Patient is not currently on an ACE inhibitor/ARB. Patient has not seen an ophthalmologist this year. Patient denies any issues with their feet.   Hyperlipidemia Patient is coming in for recheck of his hyperlipidemia. The patient is currently taking simvastatin. They deny any issues with myalgias or history of liver damage from it. They deny any focal numbness or weakness or chest pain.   No diagnosis found.  Outpatient Encounter Medications as of 04/12/2019  Medication Sig  . blood glucose meter kit and supplies KIT Dispense based on patient and insurance preference. Use up to four times daily as directed. (FOR ICD-9 250.00, 250.01).  Marland Kitchen glipiZIDE (GLUCOTROL XL) 5 MG 24 hr tablet TAKE ONE TABLET BY MOUTH DAIL Y  . glucose blood (ONE TOUCH ULTRA TEST) test strip USE 4 TIMES DAILY AS DRECTED Dx: E11.9  . metFORMIN (GLUCOPHAGE XR) 500 MG 24 hr tablet Take 2 tablets (1,000 mg total) by mouth 2 (two) times daily.  . Multiple Vitamins-Minerals (VISION FORMULA 2 PO) Take 1 capsule by mouth at bedtime.   . simvastatin (ZOCOR) 40 MG tablet Take 1 tablet (40 mg total) by mouth daily.   No facility-administered encounter medications on file as of 04/12/2019.     Review of Systems  Constitutional: Negative for chills and fever.  Eyes: Negative for redness and visual disturbance.  Respiratory: Negative for chest tightness and shortness of breath.   Cardiovascular: Negative for chest pain and leg swelling.  Musculoskeletal: Negative for back pain and gait problem.  Skin: Negative for rash.  Neurological: Negative for dizziness, light-headedness and headaches.  Psychiatric/Behavioral: Negative for agitation and behavioral problems.  All other systems reviewed and are negative.   Observations/Objective: Patient sounds comfortable and in no acute distress  Assessment and Plan: Problem List Items Addressed This Visit      Endocrine   Type 2 diabetes mellitus ([place]) - Primary   Hyperlipidemia associated with type 2 diabetes mellitus ([place])       Follow Up Instructions:  Follow-up in 3 months for diabetes recheck  Continue metformin and glipizide and simvastatin   I discussed the assessment and treatment plan with the patient. The patient was provided an opportunity to ask questions and all were answered. The patient agreed with the plan and demonstrated an understanding of the instructions.   The patient was advised to call back or seek an in-person evaluation if the symptoms worsen or if the condition fails to improve as anticipated.  The above assessment and management plan was discussed with the patient. The patient verbalized understanding of and has agreed to the management plan. Patient is  aware to call the clinic if symptoms persist or worsen. Patient is aware when to return to the clinic for a follow-up visit. Patient educated on when it is appropriate to go to the emergency department.    I provided 6 minutes of non-face-to-face time during this encounter.    Worthy Rancher, MD

## 2019-04-17 LAB — HM DIABETES EYE EXAM

## 2019-05-02 ENCOUNTER — Other Ambulatory Visit: Payer: Self-pay | Admitting: *Deleted

## 2019-05-02 MED ORDER — ONETOUCH ULTRASOFT LANCETS MISC: 12 refills | Status: DC

## 2019-07-18 ENCOUNTER — Other Ambulatory Visit: Payer: Self-pay

## 2019-07-18 ENCOUNTER — Other Ambulatory Visit: Payer: PPO

## 2019-07-18 DIAGNOSIS — E1169 Type 2 diabetes mellitus with other specified complication: Secondary | ICD-10-CM | POA: Diagnosis not present

## 2019-07-18 DIAGNOSIS — E785 Hyperlipidemia, unspecified: Secondary | ICD-10-CM | POA: Diagnosis not present

## 2019-07-18 LAB — BAYER DCA HB A1C WAIVED: HB A1C (BAYER DCA - WAIVED): 6.9 %

## 2019-07-18 NOTE — Addendum Note (Signed)
Addended by: Nigel Berthold C on: 07/18/2019 10:06 AM   Modules accepted: Orders

## 2019-07-19 LAB — CBC WITH DIFFERENTIAL/PLATELET
Basophils Absolute: 0.1 10*3/uL (ref 0.0–0.2)
Basos: 1 %
EOS (ABSOLUTE): 0.1 10*3/uL (ref 0.0–0.4)
Eos: 2 %
Hematocrit: 37.3 % (ref 34.0–46.6)
Hemoglobin: 12.4 g/dL (ref 11.1–15.9)
Immature Grans (Abs): 0 10*3/uL (ref 0.0–0.1)
Immature Granulocytes: 0 %
Lymphocytes Absolute: 2.9 10*3/uL (ref 0.7–3.1)
Lymphs: 54 %
MCH: 31.9 pg (ref 26.6–33.0)
MCHC: 33.2 g/dL (ref 31.5–35.7)
MCV: 96 fL (ref 79–97)
Monocytes Absolute: 0.6 10*3/uL (ref 0.1–0.9)
Monocytes: 11 %
Neutrophils Absolute: 1.7 10*3/uL (ref 1.4–7.0)
Neutrophils: 32 %
Platelets: 234 10*3/uL (ref 150–450)
RBC: 3.89 x10E6/uL (ref 3.77–5.28)
RDW: 12.2 % (ref 11.7–15.4)
WBC: 5.4 10*3/uL (ref 3.4–10.8)

## 2019-07-19 LAB — MICROALBUMIN / CREATININE URINE RATIO
Creatinine, Urine: 46.9 mg/dL
Microalb/Creat Ratio: 13 mg/g creat (ref 0–29)
Microalbumin, Urine: 6.3 ug/mL

## 2019-07-19 LAB — LIPID PANEL
Chol/HDL Ratio: 2.3 ratio (ref 0.0–4.4)
Cholesterol, Total: 175 mg/dL (ref 100–199)
HDL: 75 mg/dL (ref >39)
LDL Chol Calc (NIH): 77 mg/dL (ref 0–99)
Triglycerides: 136 mg/dL (ref 0–149)
VLDL Cholesterol Cal: 23 mg/dL (ref 5–40)

## 2019-07-19 LAB — BMP8+EGFR
BUN/Creatinine Ratio: 16 (ref 12–28)
BUN: 14 mg/dL (ref 8–27)
CO2: 24 mmol/L (ref 20–29)
Calcium: 8.9 mg/dL (ref 8.7–10.3)
Chloride: 106 mmol/L (ref 96–106)
Creatinine, Ser: 0.88 mg/dL (ref 0.57–1.00)
GFR calc Af Amer: 78 mL/min/{1.73_m2} (ref >59)
GFR calc non Af Amer: 67 mL/min/{1.73_m2} (ref >59)
Glucose: 119 mg/dL — ABNORMAL HIGH (ref 65–99)
Potassium: 4.9 mmol/L (ref 3.5–5.2)
Sodium: 144 mmol/L (ref 134–144)

## 2019-07-21 ENCOUNTER — Encounter: Payer: Self-pay | Admitting: *Deleted

## 2019-07-24 ENCOUNTER — Other Ambulatory Visit: Payer: Self-pay

## 2019-07-25 ENCOUNTER — Other Ambulatory Visit: Payer: Self-pay

## 2019-07-25 ENCOUNTER — Encounter: Payer: Self-pay | Admitting: Family Medicine

## 2019-07-25 ENCOUNTER — Ambulatory Visit (INDEPENDENT_AMBULATORY_CARE_PROVIDER_SITE_OTHER): Payer: PPO | Admitting: Family Medicine

## 2019-07-25 VITALS — BP 137/83 | HR 85 | Temp 94.8°F | Resp 16 | Ht 62.0 in | Wt 116.8 lb

## 2019-07-25 DIAGNOSIS — Z23 Encounter for immunization: Secondary | ICD-10-CM | POA: Diagnosis not present

## 2019-07-25 DIAGNOSIS — E1169 Type 2 diabetes mellitus with other specified complication: Secondary | ICD-10-CM | POA: Diagnosis not present

## 2019-07-25 DIAGNOSIS — E785 Hyperlipidemia, unspecified: Secondary | ICD-10-CM | POA: Diagnosis not present

## 2019-07-25 NOTE — Progress Notes (Signed)
BP 137/83   Pulse 85   Temp (!) 94.8 F (34.9 C) (Temporal)   Resp 16   Ht 5' 2"  (1.575 m)   Wt 116 lb 12.8 oz (53 kg)   SpO2 97%   BMI 21.36 kg/m    Subjective:   Patient ID: Misty [name], female    DOB: 11-29-1950, 68 y.o.   MRN: [id number]  HPI: Misty [name] is a 68 y.o. female presenting on 07/25/2019 for Diabetes (check up of chronic medical conditions)   HPI Type 2 diabetes mellitus Patient comes in today for recheck of his diabetes. Patient has been currently taking metformin and glipizide, she still gets some diarrhea with the metformin extended release but has been doing better, she says is tolerable now, we discussed backing up on the dose if needed.. Patient is not currently on an ACE inhibitor/ARB because she has had some hypotensive episodes in the past with blood pressure medications. Patient has seen an ophthalmologist this year. Patient denies any issues with their feet.   Hyperlipidemia Patient is coming in for recheck of his hyperlipidemia. The patient is currently taking simvastatin. They deny any issues with myalgias or history of liver damage from it. They deny any focal numbness or weakness or chest pain.   Relevant past medical, surgical, family and social history reviewed and updated as indicated. Interim medical history since our last visit reviewed. Allergies and medications reviewed and updated.  Review of Systems  Constitutional: Negative for chills and fever.  HENT: Negative for congestion, ear discharge and ear pain.   Eyes: Negative for redness and visual disturbance.  Respiratory: Negative for chest tightness and shortness of breath.   Cardiovascular: Negative for chest pain and leg swelling.  Genitourinary: Negative for difficulty urinating and dysuria.  Musculoskeletal: Negative for back pain and gait problem.  Skin: Negative for rash.  Neurological: Negative for light-headedness and headaches.  Psychiatric/Behavioral: Negative for  agitation and behavioral problems.  All other systems reviewed and are negative.   Per HPI unless specifically indicated above   Allergies as of 07/25/2019      Reactions   Atorvastatin Other (See Comments)   MUSCLE PAIN       Medication List       Accurate as of July 25, 2019  9:00 AM. If you have any questions, ask your nurse or doctor.        blood glucose meter kit and supplies Kit Dispense based on patient and insurance preference. Use up to four times daily as directed. (FOR ICD-9 250.00, 250.01).   glipiZIDE 5 MG 24 hr tablet Commonly known as: GLUCOTROL XL TAKE ONE TABLET BY MOUTH DAIL Y   glucose blood test strip Commonly known as: ONE TOUCH ULTRA TEST USE 4 TIMES DAILY AS DRECTED Dx: E11.9   metFORMIN 500 MG 24 hr tablet Commonly known as: Glucophage XR Take 2 tablets (1,000 mg total) by mouth 2 (two) times daily.   onetouch ultrasoft lancets Test bs QID as directed   simvastatin 40 MG tablet Commonly known as: ZOCOR Take 1 tablet (40 mg total) by mouth daily.   VISION FORMULA 2 PO Take 1 capsule by mouth at bedtime.        Objective:   BP 137/83   Pulse 85   Temp (!) 94.8 F (34.9 C) (Temporal)   Resp 16   Ht 5' 2"  (1.575 m)   Wt 116 lb 12.8 oz (53 kg)   SpO2 97%   BMI  21.36 kg/m   Wt Readings from Last 3 Encounters:  07/25/19 116 lb 12.8 oz (53 kg)  01/09/19 118 lb 12.8 oz (53.9 kg)  12/01/18 119 lb (54 kg)    Physical Exam Vitals signs and nursing note reviewed.  Constitutional:      General: She is not in acute distress.    Appearance: She is well-developed. She is not diaphoretic.  Eyes:     Conjunctiva/sclera: Conjunctivae normal.     Pupils: Pupils are equal, round, and reactive to light.  Cardiovascular:     Rate and Rhythm: Normal rate and regular rhythm.     Heart sounds: Normal heart sounds. No murmur.  Pulmonary:     Effort: Pulmonary effort is normal. No respiratory distress.     Breath sounds: Normal breath  sounds. No wheezing.  Musculoskeletal: Normal range of motion.        General: No tenderness.  Skin:    General: Skin is warm and dry.     Findings: No rash.  Neurological:     Mental Status: She is alert and oriented to person, place, and time.     Coordination: Coordination normal.  Psychiatric:        Behavior: Behavior normal.     Results for orders placed or performed in visit on 07/18/19  [facility]  Result Value Ref Range   Glucose 119 (H) 65 - 99 mg/dL   BUN 14 8 - 27 mg/dL   Creatinine, Ser 0.88 0.57 - 1.00 mg/dL   GFR calc non Af Amer 67 >59 mL/min/1.73   GFR calc Af Amer 78 >59 mL/min/1.73   BUN/Creatinine Ratio 16 12 - 28   Sodium 144 134 - 144 mmol/L   Potassium 4.9 3.5 - 5.2 mmol/L   Chloride 106 96 - 106 mmol/L   CO2 24 20 - 29 mmol/L   Calcium 8.9 8.7 - 10.3 mg/dL  Lipid panel  Result Value Ref Range   Cholesterol, Total 175 100 - 199 mg/dL   Triglycerides 136 0 - 149 mg/dL   HDL 75 >39 mg/dL   VLDL Cholesterol Cal 23 5 - 40 mg/dL   LDL Chol Calc (NIH) 77 0 - 99 mg/dL   Chol/HDL Ratio 2.3 0.0 - 4.4 ratio  CBC with Differential/Platelet  Result Value Ref Range   WBC 5.4 3.4 - 10.8 x10E3/uL   RBC 3.89 3.77 - 5.28 x10E6/uL   Hemoglobin 12.4 11.1 - 15.9 g/dL   Hematocrit 37.3 34.0 - 46.6 %   MCV 96 79 - 97 fL   MCH 31.9 26.6 - 33.0 pg   MCHC 33.2 31.5 - 35.7 g/dL   RDW 12.2 11.7 - 15.4 %   Platelets 234 150 - 450 x10E3/uL   Neutrophils 32 Not Estab. %   Lymphs 54 Not Estab. %   Monocytes 11 Not Estab. %   Eos 2 Not Estab. %   Basos 1 Not Estab. %   Neutrophils Absolute 1.7 1.4 - 7.0 x10E3/uL   Lymphocytes Absolute 2.9 0.7 - 3.1 x10E3/uL   Monocytes Absolute 0.6 0.1 - 0.9 x10E3/uL   EOS (ABSOLUTE) 0.1 0.0 - 0.4 x10E3/uL   Basophils Absolute 0.1 0.0 - 0.2 x10E3/uL   Immature Granulocytes 0 Not Estab. %   Immature Grans (Abs) 0.0 0.0 - 0.1 x10E3/uL  Bayer DCA Hb A1c Waived  Result Value Ref Range   HB A1C (BAYER DCA - WAIVED) 6.9 <7.0 %   Microalbumin / creatinine urine ratio  Result Value Ref Range  Creatinine, Urine 46.9 Not Estab. mg/dL   Microalbumin, Urine 6.3 Not Estab. ug/mL   Microalb/Creat Ratio 13 0 - 29 mg/g creat    Assessment & Plan:   Problem List Items Addressed This Visit      Endocrine   Type 2 diabetes mellitus ([place]) - Primary   Relevant Orders   Bayer DCA Hb A1c Waived   Hyperlipidemia associated with type 2 diabetes mellitus ([place])    Patient is not on an ACE inhibitor because she has had some hypotensive episodes in the past  Continue metformin and glipizide, A1c 6.9 last week  Cholesterol looks great continue simvastatin   Follow up plan: Return in about 3 months (around 10/24/2019), or if symptoms worsen or fail to improve, for Diabetes and hypertension recheck.  Counseling provided for all of the vaccine components Orders Placed This Encounter  Procedures  . [facility] Hb A1c [place], MD [facility] 07/25/2019, 9:00 AM

## 2019-08-10 ENCOUNTER — Other Ambulatory Visit: Payer: Self-pay | Admitting: Family Medicine

## 2019-08-10 DIAGNOSIS — E119 Type 2 diabetes mellitus without complications: Secondary | ICD-10-CM

## 2019-08-10 DIAGNOSIS — E1169 Type 2 diabetes mellitus with other specified complication: Secondary | ICD-10-CM

## 2019-08-10 DIAGNOSIS — E785 Hyperlipidemia, unspecified: Secondary | ICD-10-CM

## 2019-08-10 MED ORDER — METFORMIN HCL ER 500 MG PO TB24: 1000.0000 mg | ORAL_TABLET | Freq: Two times a day (BID) | ORAL | 3 refills | Status: DC

## 2019-10-25 ENCOUNTER — Ambulatory Visit (INDEPENDENT_AMBULATORY_CARE_PROVIDER_SITE_OTHER): Payer: PPO | Admitting: Family Medicine

## 2019-10-25 ENCOUNTER — Encounter: Payer: Self-pay | Admitting: Family Medicine

## 2019-10-25 DIAGNOSIS — E785 Hyperlipidemia, unspecified: Secondary | ICD-10-CM

## 2019-10-25 DIAGNOSIS — E1169 Type 2 diabetes mellitus with other specified complication: Secondary | ICD-10-CM | POA: Diagnosis not present

## 2019-10-25 MED ORDER — METFORMIN HCL ER 500 MG PO TB24: 1000.0000 mg | ORAL_TABLET | Freq: Every day | ORAL | 3 refills | Status: DC

## 2019-10-25 NOTE — Progress Notes (Signed)
Virtual Visit via telephone Note  I connected with Misty [name] on 10/25/19 at [phone] by telephone and verified that I am speaking with the correct person using two identifiers. Misty [name] is currently located at home and no other people are currently with her during visit. The provider, Fransisca Kaufmann Maggie Dworkin, MD is located in their office at time of visit.  Call ended at [phone]  I discussed the limitations, risks, security and privacy concerns of performing an evaluation and management service by telephone and the availability of in person appointments. I also discussed with the patient that there may be a patient responsible charge related to this service. The patient expressed understanding and agreed to proceed.  108/84 History and Present Illness: Type 2 diabetes mellitus Patient comes in today for recheck of his diabetes. Patient has been currently taking metformin and  and bs between 108-120. Patient is not currently on an ACE inhibitor/ARB. Patient has not seen an ophthalmologist this year. Patient denies any issues with their feet.   Hyperlipidemia Patient is coming in for recheck of his hyperlipidemia. The patient is currently taking simvastatin. They deny any issues with myalgias or history of liver damage from it. They deny any focal numbness or weakness or chest pain.   No diagnosis found.  Outpatient Encounter Medications as of 10/25/2019  Medication Sig  . blood glucose meter kit and supplies KIT Dispense based on patient and insurance preference. Use up to four times daily as directed. (FOR ICD-9 250.00, 250.01).  Marland Kitchen glipiZIDE (GLUCOTROL XL) 5 MG 24 hr tablet TAKE ONE TABLET BY MOUTH DAIL Y  . glucose blood (ONE TOUCH ULTRA TEST) test strip USE 4 TIMES DAILY AS DRECTED Dx: E11.9  . Lancets (ONETOUCH ULTRASOFT) lancets Test bs QID as directed  . metFORMIN (GLUCOPHAGE XR) 500 MG 24 hr tablet Take 2 tablets (1,000 mg total) by mouth 2 (two) times daily.  . Multiple  Vitamins-Minerals (VISION FORMULA 2 PO) Take 1 capsule by mouth at bedtime.  . simvastatin (ZOCOR) 40 MG tablet TAKE 1 TABLET BY MOUTH EVERY DAY   No facility-administered encounter medications on file as of 10/25/2019.    Review of Systems  Constitutional: Negative for chills and fever.  Eyes: Negative for visual disturbance.  Respiratory: Negative for chest tightness and shortness of breath.   Cardiovascular: Negative for chest pain and leg swelling.  Musculoskeletal: Negative for back pain and gait problem.  Skin: Negative for rash.  Neurological: Negative for light-headedness and headaches.  Psychiatric/Behavioral: Negative for agitation, behavioral problems, dysphoric mood, self-injury, sleep disturbance and suicidal ideas. The patient is not nervous/anxious.   All other systems reviewed and are negative.   Observations/Objective: Patient sounds comfortable and in no acute distress  Assessment and Plan: Problem List Items Addressed This Visit      Endocrine   Type 2 diabetes mellitus ([place]) - Primary   Relevant Medications   metFORMIN (GLUCOPHAGE XR) 500 MG 24 hr tablet   Hyperlipidemia associated with type 2 diabetes mellitus ([facility])   Relevant Medications   metFORMIN (GLUCOPHAGE XR) 500 MG 24 hr tablet      No change in medications Follow Up Instructions: Follow up in 3 months.     I discussed the assessment and treatment plan with the patient. The patient was provided an opportunity to ask questions and all were answered. The patient agreed with the plan and demonstrated an understanding of the instructions.   The patient was advised to call back or seek  an in-person evaluation if the symptoms worsen or if the condition fails to improve as anticipated.  The above assessment and management plan was discussed with the patient. The patient verbalized understanding of and has agreed to the management plan. Patient is aware to call the clinic if symptoms persist or worsen.  Patient is aware when to return to the clinic for a follow-up visit. Patient educated on when it is appropriate to go to the emergency department.    I provided 10 minutes of non-face-to-face time during this encounter.    Worthy Rancher, MD

## 2020-01-29 ENCOUNTER — Other Ambulatory Visit: Payer: PPO

## 2020-01-29 ENCOUNTER — Other Ambulatory Visit: Payer: Self-pay

## 2020-01-29 ENCOUNTER — Other Ambulatory Visit: Payer: Self-pay | Admitting: Family Medicine

## 2020-01-29 DIAGNOSIS — E1169 Type 2 diabetes mellitus with other specified complication: Secondary | ICD-10-CM

## 2020-01-29 DIAGNOSIS — E785 Hyperlipidemia, unspecified: Secondary | ICD-10-CM

## 2020-01-29 LAB — BAYER DCA HB A1C WAIVED: HB A1C (BAYER DCA - WAIVED): 7.5 % — ABNORMAL HIGH (ref <7.0)

## 2020-01-29 NOTE — Progress Notes (Signed)
Placed lab orders for the patient 

## 2020-01-30 LAB — CMP14+EGFR
ALT: 21 IU/L (ref 0–32)
AST: 22 IU/L (ref 0–40)
Albumin/Globulin Ratio: 2.2 (ref 1.2–2.2)
Albumin: 4.1 g/dL (ref 3.8–4.8)
Alkaline Phosphatase: 90 IU/L (ref 39–117)
BUN/Creatinine Ratio: 20 (ref 12–28)
BUN: 17 mg/dL (ref 8–27)
Bilirubin Total: <0.2 mg/dL (ref 0.0–1.2)
CO2: 23 mmol/L (ref 20–29)
Calcium: 9.1 mg/dL (ref 8.7–10.3)
Chloride: 101 mmol/L (ref 96–106)
Creatinine, Ser: 0.87 mg/dL (ref 0.57–1.00)
GFR calc Af Amer: 79 mL/min/{1.73_m2} (ref >59)
GFR calc non Af Amer: 68 mL/min/{1.73_m2} (ref >59)
Globulin, Total: 1.9 g/dL (ref 1.5–4.5)
Glucose: 171 mg/dL — ABNORMAL HIGH (ref 65–99)
Potassium: 4.8 mmol/L (ref 3.5–5.2)
Sodium: 138 mmol/L (ref 134–144)
Total Protein: 6 g/dL (ref 6.0–8.5)

## 2020-01-30 LAB — CBC WITH DIFFERENTIAL/PLATELET
Basophils Absolute: 0.1 10*3/uL (ref 0.0–0.2)
Basos: 1 %
EOS (ABSOLUTE): 0.1 10*3/uL (ref 0.0–0.4)
Eos: 1 %
Hematocrit: 35.3 % (ref 34.0–46.6)
Hemoglobin: 12.1 g/dL (ref 11.1–15.9)
Immature Grans (Abs): 0 10*3/uL (ref 0.0–0.1)
Immature Granulocytes: 0 %
Lymphocytes Absolute: 3.5 10*3/uL — ABNORMAL HIGH (ref 0.7–3.1)
Lymphs: 61 %
MCH: 32.8 pg (ref 26.6–33.0)
MCHC: 34.3 g/dL (ref 31.5–35.7)
MCV: 96 fL (ref 79–97)
Monocytes Absolute: 0.5 10*3/uL (ref 0.1–0.9)
Monocytes: 9 %
Neutrophils Absolute: 1.6 10*3/uL (ref 1.4–7.0)
Neutrophils: 28 %
Platelets: 235 10*3/uL (ref 150–450)
RBC: 3.69 x10E6/uL — ABNORMAL LOW (ref 3.77–5.28)
RDW: 12.1 % (ref 11.7–15.4)
WBC: 5.8 10*3/uL (ref 3.4–10.8)

## 2020-01-30 LAB — LIPID PANEL
Chol/HDL Ratio: 2.5 ratio (ref 0.0–4.4)
Cholesterol, Total: 190 mg/dL (ref 100–199)
HDL: 77 mg/dL (ref >39)
LDL Chol Calc (NIH): 83 mg/dL (ref 0–99)
Triglycerides: 179 mg/dL — ABNORMAL HIGH (ref 0–149)
VLDL Cholesterol Cal: 30 mg/dL (ref 5–40)

## 2020-01-31 ENCOUNTER — Encounter: Payer: Self-pay | Admitting: Family Medicine

## 2020-01-31 ENCOUNTER — Ambulatory Visit (INDEPENDENT_AMBULATORY_CARE_PROVIDER_SITE_OTHER): Payer: PPO | Admitting: Family Medicine

## 2020-01-31 ENCOUNTER — Other Ambulatory Visit: Payer: Self-pay

## 2020-01-31 VITALS — BP 125/69 | HR 73 | Temp 97.1°F | Ht 62.0 in | Wt 122.0 lb

## 2020-01-31 DIAGNOSIS — E119 Type 2 diabetes mellitus without complications: Secondary | ICD-10-CM

## 2020-01-31 DIAGNOSIS — E785 Hyperlipidemia, unspecified: Secondary | ICD-10-CM | POA: Diagnosis not present

## 2020-01-31 DIAGNOSIS — E1169 Type 2 diabetes mellitus with other specified complication: Secondary | ICD-10-CM

## 2020-01-31 NOTE — Progress Notes (Signed)
BP 125/69   Pulse 73   Temp (!) 97.1 F (36.2 C)   Ht 5' 2"  (1.575 m)   Wt 122 lb (55.3 kg)   SpO2 97%   BMI 22.31 kg/m    Subjective:   Patient ID: Misty [name], female    DOB: 12/07/1950, 69 y.o.   MRN: [id number]  HPI: Misty [name] is a 69 y.o. female presenting on 01/31/2020 for Medical Management of Chronic Issues, Diabetes, and Hyperlipidemia   HPI Type 2 diabetes mellitus Patient comes in today for recheck of his diabetes. Patient has been currently taking Metformin 500 twice daily and glipizide, A1c is 7.5 which is up slightly from where she has been, it was 6.9 last time.  The gyms have been close due to Covid and they have discharged opening so she is can start getting back in there. Patient is not currently on an ACE inhibitor/ARB. Patient has seen an ophthalmologist this year. Patient denies any issues with their feet.   Hyperlipidemia Patient is coming in for recheck of his hyperlipidemia. The patient is currently taking simvastatin. They deny any issues with myalgias or history of liver damage from it. They deny any focal numbness or weakness or chest pain.   Relevant past medical, surgical, family and social history reviewed and updated as indicated. Interim medical history since our last visit reviewed. Allergies and medications reviewed and updated.  Review of Systems  Constitutional: Negative for chills and fever.  Eyes: Negative for visual disturbance.  Respiratory: Negative for chest tightness and shortness of breath.   Cardiovascular: Negative for chest pain and leg swelling.  Genitourinary: Negative for difficulty urinating and dysuria.  Musculoskeletal: Negative for back pain and gait problem.  Skin: Negative for rash.  Neurological: Negative for light-headedness and headaches.  Psychiatric/Behavioral: Negative for agitation and behavioral problems.  All other systems reviewed and are negative.   Per HPI unless specifically indicated  above   Allergies as of 01/31/2020      Reactions   Atorvastatin Other (See Comments)   MUSCLE PAIN       Medication List       Accurate as of January 31, 2020 10:12 AM. If you have any questions, ask your nurse or doctor.        blood glucose meter kit and supplies Kit Dispense based on patient and insurance preference. Use up to four times daily as directed. (FOR ICD-9 250.00, 250.01).   glipiZIDE 5 MG 24 hr tablet Commonly known as: GLUCOTROL XL TAKE ONE TABLET BY MOUTH DAIL Y   glucose blood test strip Commonly known as: ONE TOUCH ULTRA TEST USE 4 TIMES DAILY AS DRECTED Dx: E11.9   metFORMIN 500 MG 24 hr tablet Commonly known as: Glucophage XR Take 2 tablets (1,000 mg total) by mouth at bedtime.   onetouch ultrasoft lancets Test bs QID as directed   simvastatin 40 MG tablet Commonly known as: ZOCOR TAKE 1 TABLET BY MOUTH EVERY DAY   VISION FORMULA 2 PO Take 1 capsule by mouth at bedtime.        Objective:   BP 125/69   Pulse 73   Temp (!) 97.1 F (36.2 C)   Ht 5' 2"  (1.575 m)   Wt 122 lb (55.3 kg)   SpO2 97%   BMI 22.31 kg/m   Wt Readings from Last 3 Encounters:  01/31/20 122 lb (55.3 kg)  07/25/19 116 lb 12.8 oz (53 kg)  01/09/19 118 lb 12.8 oz (53.9  kg)    Physical Exam Vitals and nursing note reviewed.  Constitutional:      General: She is not in acute distress.    Appearance: She is well-developed. She is not diaphoretic.  Eyes:     Conjunctiva/sclera: Conjunctivae normal.  Cardiovascular:     Rate and Rhythm: Normal rate and regular rhythm.     Heart sounds: Normal heart sounds. No murmur.  Pulmonary:     Effort: Pulmonary effort is normal. No respiratory distress.     Breath sounds: Normal breath sounds. No wheezing.  Musculoskeletal:        General: No tenderness. Normal range of motion.  Skin:    General: Skin is warm and dry.     Findings: No rash.  Neurological:     Mental Status: She is alert and oriented to person, place,  and time.     Coordination: Coordination normal.  Psychiatric:        Behavior: Behavior normal.     Results for orders placed or performed in visit on 01/29/20  Bayer DCA Hb A1c Waived  Result Value Ref Range   HB A1C (BAYER DCA - WAIVED) 7.5 (H) <7.0 %  Lipid panel  Result Value Ref Range   Cholesterol, Total 190 100 - 199 mg/dL   Triglycerides 179 (H) 0 - 149 mg/dL   HDL 77 >39 mg/dL   VLDL Cholesterol Cal 30 5 - 40 mg/dL   LDL Chol Calc (NIH) 83 0 - 99 mg/dL   Chol/HDL Ratio 2.5 0.0 - 4.4 ratio  CMP14+EGFR  Result Value Ref Range   Glucose 171 (H) 65 - 99 mg/dL   BUN 17 8 - 27 mg/dL   Creatinine, Ser 0.87 0.57 - 1.00 mg/dL   GFR calc non Af Amer 68 >59 mL/min/1.73   GFR calc Af Amer 79 >59 mL/min/1.73   BUN/Creatinine Ratio 20 12 - 28   Sodium 138 134 - 144 mmol/L   Potassium 4.8 3.5 - 5.2 mmol/L   Chloride 101 96 - 106 mmol/L   CO2 23 20 - 29 mmol/L   Calcium 9.1 8.7 - 10.3 mg/dL   Total Protein 6.0 6.0 - 8.5 g/dL   Albumin 4.1 3.8 - 4.8 g/dL   Globulin, Total 1.9 1.5 - 4.5 g/dL   Albumin/Globulin Ratio 2.2 1.2 - 2.2   Bilirubin Total <0.2 0.0 - 1.2 mg/dL   Alkaline Phosphatase 90 39 - 117 IU/L   AST 22 0 - 40 IU/L   ALT 21 0 - 32 IU/L  CBC with Differential/Platelet  Result Value Ref Range   WBC 5.8 3.4 - 10.8 x10E3/uL   RBC 3.69 (L) 3.77 - 5.28 x10E6/uL   Hemoglobin 12.1 11.1 - 15.9 g/dL   Hematocrit 35.3 34.0 - 46.6 %   MCV 96 79 - 97 fL   MCH 32.8 26.6 - 33.0 pg   MCHC 34.3 31.5 - 35.7 g/dL   RDW 12.1 11.7 - 15.4 %   Platelets 235 150 - 450 x10E3/uL   Neutrophils 28 Not Estab. %   Lymphs 61 Not Estab. %   Monocytes 9 Not Estab. %   Eos 1 Not Estab. %   Basos 1 Not Estab. %   Neutrophils Absolute 1.6 1.4 - 7.0 x10E3/uL   Lymphocytes Absolute 3.5 (H) 0.7 - 3.1 x10E3/uL   Monocytes Absolute 0.5 0.1 - 0.9 x10E3/uL   EOS (ABSOLUTE) 0.1 0.0 - 0.4 x10E3/uL   Basophils Absolute 0.1 0.0 - 0.2 x10E3/uL   Immature  Granulocytes 0 Not Estab. %   Immature  Grans (Abs) 0.0 0.0 - 0.1 x10E3/uL    Assessment & Plan:   Problem List Items Addressed This Visit      Endocrine   Type 2 diabetes mellitus ([place]) - Primary   Relevant Orders   Bayer DCA Hb A1c Waived   CMP14+EGFR   Hyperlipidemia associated with type 2 diabetes mellitus ([place])      She will focus on diet and exercise to get her A1c down continue current medications and will reevaluate in 3 months. Follow up plan: Return in about 3 months (around 05/02/2020), or if symptoms worsen or fail to improve, for Diabetes and cholesterol .  Counseling provided for all of the vaccine components Orders Placed This Encounter  Procedures  . Bayer DCA Hb A1c Waived  . Brule, MD [facility] 01/31/2020, 10:12 AM

## 2020-02-13 ENCOUNTER — Ambulatory Visit (INDEPENDENT_AMBULATORY_CARE_PROVIDER_SITE_OTHER): Payer: PPO

## 2020-02-13 VITALS — BP 120/69

## 2020-02-13 DIAGNOSIS — Z0000 Encounter for general adult medical examination without abnormal findings: Secondary | ICD-10-CM
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
## 2020-02-23 ENCOUNTER — Telehealth: Payer: Self-pay | Admitting: Family Medicine

## 2020-02-23 NOTE — Chronic Care Management (AMB) (Signed)
  Chronic Care Management   Note  02/23/2020 Name: Misty [name] MRN: [id number] DOB: October 09, 1951  Murlean Caller Prom is a 69 y.o. year old female who is a primary care patient of Dettinger, Fransisca Kaufmann, MD. I reached out to [employer] by phone today in response to a referral sent by Ms. [name]'s health plan.     Ms. Lizardi was given information about Chronic Care Management services today including:  1. CCM service includes personalized support from designated clinical staff supervised by her physician, including individualized plan of care and coordination with other care providers 2. 24/7 contact phone numbers for assistance for urgent and routine care needs. 3. Service will only be billed when office clinical staff spend 20 minutes or more in a month to coordinate care. 4. Only one practitioner may furnish and bill the service in a calendar month. 5. The patient may stop CCM services at any time (effective at the end of the month) by phone call to the office staff. 6. The patient will be responsible for cost sharing (co-pay) of up to 20% of the service fee (after annual deductible is met).  Patient agreed to services and verbal consent obtained.   Follow up plan: Telephone appointment with care management team member scheduled for: 03/28/2020  [place], [place] [address] Direct Dial: [email or web address] Website: [email or web address]

## 2020-02-23 NOTE — Chronic Care Management (AMB) (Signed)
  Chronic Care Management   Outreach Note  02/23/2020 Name: Misty [name] MRN: [id number] DOB: 07/12/1951  Misty [name] is a 69 y.o. year old female who is a primary care patient of Dettinger, Fransisca Kaufmann, MD. I reached out to [employer] by phone today in response to a referral sent by Ms. Misty [name]'s health plan.     An unsuccessful telephone outreach was attempted today. The patient was referred to the case management team for assistance with care management and care coordination.   Follow Up Plan: A HIPPA compliant phone message was left for the patient providing contact information and requesting a return call. The care management team will reach out to the patient again over the next 7 days. If patient returns call to provider office, please advise to call [place] at [phone].  [place], [place] [address] Direct Dial: [phone] [email or web address]

## 2020-03-28 ENCOUNTER — Ambulatory Visit (INDEPENDENT_AMBULATORY_CARE_PROVIDER_SITE_OTHER): Payer: PPO | Admitting: Licensed Clinical Social Worker

## 2020-03-28 DIAGNOSIS — E785 Hyperlipidemia, unspecified: Secondary | ICD-10-CM | POA: Diagnosis not present

## 2020-03-28 DIAGNOSIS — E1169 Type 2 diabetes mellitus with other specified complication: Secondary | ICD-10-CM | POA: Diagnosis not present

## 2020-03-28 DIAGNOSIS — D122 Benign neoplasm of ascending colon: Secondary | ICD-10-CM

## 2020-03-28 NOTE — Chronic Care Management (AMB) (Signed)
Chronic Care Management    Clinical Social Work Follow Up Note  03/28/2020 Name: Misty [name] MRN: [id number] DOB: 1951/09/04  Misty [name] is a 69 y.o. year old female who is a primary care patient of Dettinger, Fransisca Kaufmann, MD. The CCM team was consulted for assistance with Misty [name] .   Review of patient status, including review of consultants reports, other relevant assessments, and collaboration with appropriate care team members and the patient's Misty [name] was performed as part of comprehensive patient evaluation and provision of chronic care management services.    SDOH (Social Determinants of Health) assessments performed: Yes; risk for depression; risk for stress; risk for tobacco use  SDOH Interventions     Most Recent Value  SDOH Interventions  Depression Interventions/Treatment   -- [talked with client about LCSW support and RNCM support]        Chronic Care Management from 03/28/2020 in [place]  PHQ-9 Total Score  1     GAD 7 : Generalized Anxiety Score 03/28/2020  Nervous, Anxious, on Edge 0  Control/stop worrying 0  Worry too much - different things 0  Trouble relaxing 1  Restless 1  Easily annoyed or irritable 0  Afraid - awful might happen 0  Total GAD 7 Score 2  Anxiety Difficulty Somewhat difficult    Outpatient Encounter Medications as of 03/28/2020  Medication Sig  . blood glucose meter kit and supplies KIT Dispense based on patient and insurance preference. Use up to four times daily as directed. (FOR ICD-9 250.00, 250.01).  Marland Kitchen glipiZIDE (GLUCOTROL XL) 5 MG 24 hr tablet TAKE ONE TABLET BY MOUTH DAIL Y  . glucose blood (ONE TOUCH ULTRA TEST) test strip USE 4 TIMES DAILY AS DRECTED Dx: E11.9  . Lancets (ONETOUCH ULTRASOFT) lancets Test bs QID as directed  . metFORMIN (GLUCOPHAGE XR) 500 MG 24 hr tablet Take 2 tablets (1,000 mg total) by mouth at bedtime.  . Multiple Vitamins-Minerals (VISION FORMULA 2 PO) Take 1  capsule by mouth at bedtime.  . simvastatin (ZOCOR) 40 MG tablet TAKE 1 TABLET BY MOUTH EVERY DAY   No facility-administered encounter medications on file as of 03/28/2020.    Goals Addressed            This Visit's Progress   . Client will talk with LCSW and RNCM in next 30 days about challenges in managing client diabetes (pt-stated)       CARE PLAN ENTRY  Current Barriers:   Challenges in managing DM in patient with Chronic Diagnoses of Type 2 DM, HLD, Adenoma of Ascending Colon  Clinical Social Work Clinical Goal(s):   LCSW will call client in next 4 weeks to talk with client about challenges in managing diabetes  Interventions:  . Talked with client about CCM program support . Talked with Client about her job responsibilities . Talked with client about social support network (children are supportive) . Talked with client about completion of ADLs . Talked with client about pain issues of client . Talked with client about her challenges in managing diabetes . Talked with client about her exercise at local gym and about her walking regularly at her job . Encouraged Misty [name] to talk with Misty [name] as needed for nursing support . Talked with client about her upcoming medical appointments . Talked with client about sleeping challenges of client  Patient Self Care Activities:   Does ADLs independently Drives car as needed Attends scheduled medical appointments  Patient Self Care Deficits:  .  Challenges in managing DM  Initial goal documentation      Follow Up Plan: LCSW to call client in next 4 weeks to talk with client about challenges in managing her diabetes  Misty [name] MSW, LCSW Licensed Clinical Social Worker [facility] Care Management [phone]

## 2020-03-28 NOTE — Patient Instructions (Signed)
Licensed Clinical Social Worker Visit Information  Goals we discussed today:  Goals Addressed            This Visit's Progress   . Client will talk with LCSW and RNCM n next 30 days about challenges in managing client diabetes (pt-stated)       CARE PLAN ENTRY   Current Barriers:   Challenges in managing DM in patient with Chronic Diagnoses of Type 2 DM, HLD, Adenoma of Ascending Colon  Clinical Social Work Clinical Goal(s):   LCSW will call client in next 4 weeks to talk with client about challenges in managing diabetes  Interventions:  . Talked with client about CCM program support . Talked with Client about her job responsibilities . Talked with client about social support network (children are supportive) . Talked with client about completion of ADLs . Talked with client about pain issues of client . Talked with client about her challenges in managing diabetes . Talked with client about her exercise at local gym and about her walking regularly at her job . Encouraged Demariyah to talk with [facility] as needed for nursing support . Talked with client about her upcoming medical appointments . Talked with client about sleeping challenges of client  Patient Self Care Activities:   Does ADLs independently Drives car as needed Attends scheduled medical appointments  Patient Self Care Deficits:  . Challenges in managing DM   Initial goal documentation       Materials Provided: No  Follow Up Plan: LCSW to call client in next 4 weeks to talk with client about challenges in managing diabetes  The patient verbalized understanding of instructions provided today and declined a print copy of patient instruction materials.   [name].Pelagia Iacobucci MSW, LCSW Licensed Clinical Social Worker [facility] [phone]

## 2020-04-03 DIAGNOSIS — Z1231 Encounter for screening mammogram for malignant neoplasm of breast: Secondary | ICD-10-CM | POA: Diagnosis not present

## 2020-05-02 ENCOUNTER — Other Ambulatory Visit: Payer: PPO

## 2020-05-02 ENCOUNTER — Ambulatory Visit (INDEPENDENT_AMBULATORY_CARE_PROVIDER_SITE_OTHER): Payer: PPO | Admitting: Licensed Clinical Social Worker

## 2020-05-02 ENCOUNTER — Other Ambulatory Visit: Payer: Self-pay

## 2020-05-02 DIAGNOSIS — E1169 Type 2 diabetes mellitus with other specified complication: Secondary | ICD-10-CM | POA: Diagnosis not present

## 2020-05-02 DIAGNOSIS — D122 Benign neoplasm of ascending colon: Secondary | ICD-10-CM

## 2020-05-02 DIAGNOSIS — E785 Hyperlipidemia, unspecified: Secondary | ICD-10-CM | POA: Diagnosis not present

## 2020-05-02 DIAGNOSIS — E119 Type 2 diabetes mellitus without complications: Secondary | ICD-10-CM | POA: Diagnosis not present

## 2020-05-02 LAB — CMP14+EGFR
ALT: 20 IU/L (ref 0–32)
AST: 26 IU/L (ref 0–40)
Albumin/Globulin Ratio: 2.2 (ref 1.2–2.2)
Albumin: 4.2 g/dL (ref 3.8–4.8)
Alkaline Phosphatase: 84 IU/L (ref 48–121)
BUN/Creatinine Ratio: 18 (ref 12–28)
BUN: 14 mg/dL (ref 8–27)
Bilirubin Total: 0.3 mg/dL (ref 0.0–1.2)
CO2: 23 mmol/L (ref 20–29)
Calcium: 9.4 mg/dL (ref 8.7–10.3)
Chloride: 104 mmol/L (ref 96–106)
Creatinine, Ser: 0.77 mg/dL (ref 0.57–1.00)
GFR calc Af Amer: 90 mL/min/{1.73_m2} (ref >59)
GFR calc non Af Amer: 78 mL/min/{1.73_m2} (ref >59)
Globulin, Total: 1.9 g/dL (ref 1.5–4.5)
Glucose: 152 mg/dL — ABNORMAL HIGH (ref 65–99)
Potassium: 4.5 mmol/L (ref 3.5–5.2)
Sodium: 141 mmol/L (ref 134–144)
Total Protein: 6.1 g/dL (ref 6.0–8.5)

## 2020-05-02 LAB — BAYER DCA HB A1C WAIVED: HB A1C (BAYER DCA - WAIVED): 7.7 % — ABNORMAL HIGH (ref <7.0)

## 2020-05-02 NOTE — Chronic Care Management (AMB) (Signed)
Chronic Care Management    Clinical Social Work Follow Up Note  05/02/2020 Name: Misty [name] MRN: [id number] DOB: 05-Aug-1951  Misty [name] is a 69 y.o. year old female who is a primary care patient of Dettinger, Fransisca Kaufmann, MD. The CCM team was consulted for assistance with [name] .   Review of patient status, including review of consultants reports, other relevant assessments, and collaboration with appropriate care team members and the patient's provider was performed as part of comprehensive patient evaluation and provision of chronic care management services.    SDOH (Social Determinants of Health) assessments performed: No;risk for tobacco use; risk for depression;risk for stress    Chronic Care Management from 03/28/2020 in [place]  PHQ-9 Total Score 1       GAD 7 : Generalized Anxiety Score 03/28/2020  Nervous, Anxious, on Edge 0  Control/stop worrying 0  Worry too much - different things 0  Trouble relaxing 1  Restless 1  Easily annoyed or irritable 0  Afraid - awful might happen 0  Total GAD 7 Score 2  Anxiety Difficulty Somewhat difficult    Outpatient Encounter Medications as of 05/02/2020  Medication Sig  . blood glucose meter kit and supplies KIT Dispense based on patient and insurance preference. Use up to four times daily as directed. (FOR ICD-9 250.00, 250.01).  Marland Kitchen glipiZIDE (GLUCOTROL XL) 5 MG 24 hr tablet TAKE ONE TABLET BY MOUTH DAIL Y  . glucose blood (ONE TOUCH ULTRA TEST) test strip USE 4 TIMES DAILY AS DRECTED Dx: E11.9  . Lancets (ONETOUCH ULTRASOFT) lancets Test bs QID as directed  . metFORMIN (GLUCOPHAGE XR) 500 MG 24 hr tablet Take 2 tablets (1,000 mg total) by mouth at bedtime.  . Multiple Vitamins-Minerals (VISION FORMULA 2 PO) Take 1 capsule by mouth at bedtime.  . simvastatin (ZOCOR) 40 MG tablet TAKE 1 TABLET BY MOUTH EVERY DAY   No facility-administered encounter medications on file as of 05/02/2020.      Goals    .  Client will talk with LCSW and RNCM about challenges in managing client diabetes (pt-stated)      CARE PLAN ENTRY   Current Barriers:   Challenges in managing DM in patient with Chronic Diagnoses of Type 2 DM, HLD, Adenoma of Ascending Colon  Clinical Social Work Clinical Goal(s):   LCSW will call client in next 4 weeks to talk with client about challenges in managing diabetes  Interventions:  . Talked with client about CCM program support . Talked with Client about her job responsibilities . Talked with client about social support network (children are supportive) . Talked with client about completion of ADLs . Talked with client about pain issues of client . Talked with client about her challenges in managing diabetes . Talked with client about her exercise at local gym and about her walking regularly at her job . Encouraged Misty [name] to talk with [facility] as needed for nursing support . Talked with client about her upcoming medical appointments . Talked with client about sleeping challenges of client . Talked with client about relaxation techniques (likes to walk, likes to listen to music, watch movies) . Talked with client about mood of client (she said she does not get depressed too often)  Patient Self Care Activities:   Does ADLs independently Drives car as needed Attends scheduled medical appointments  Patient Self Care Deficits:  . Challenges in managing DM  Initial goal documentation   Follow Up Plan:  LCSW to call client in next 4 weeks to talk with client about challenges in managing her diabetes  [name].Davetta Olliff MSW, LCSW Licensed Clinical Social Worker [facility] [phone]

## 2020-05-02 NOTE — Patient Instructions (Addendum)
Licensed Clinical Education officer, museum Visit Information  Goals we discussed today:    Client will talk with LCSW and RNCM about challenges in managing client diabetes (pt-stated)         CARE PLAN ENTRY   Current Barriers:   Challenges in managing DM in patient with Chronic Diagnoses of Type 2 DM, HLD, Adenoma of Ascending Colon  Clinical Social Work Clinical Goal(s):   LCSW will call client in next 4 weeks to talk with client about challenges in managing diabetes  Interventions:   Talked with client about CCM program support  Talked with Client about her job responsibilities  Talked with client about social support network (children are supportive)  Talked with client about completion of ADLs  Talked with client about pain issues of client  Talked with client about her challenges in managing diabetes  Talked with client about her exercise at local gym and about her walking regularly at her job  Encouraged Mikle Bosworth to talk with RNCM as needed for nursing support  Talked with client about her upcoming medical appointments  Talked with client about sleeping challenges of client  Talked with client about relaxation techniques (likes to walk, likes to listen to music, watch movies)  Talked with client about mood of client (she said she does not get depressed too often)  Patient Self Care Activities:   Does ADLs independently Drives car as needed Attends scheduled medical appointments  Patient Self Care Deficits:   Challenges in managing DM  Initial goal documentation   Follow Up Plan:  LCSW to call client in next 4 weeks to talk with client about challenges in managing her diabetes  Materials Provided: No  The patient verbalized understanding of instructions provided today and declined a print copy of patient instruction materials.   [name].Bland Rudzinski MSW, LCSW Licensed Clinical Social Worker [facility]  [facility] [phone]

## 2020-05-03 ENCOUNTER — Other Ambulatory Visit: Payer: Self-pay | Admitting: Family Medicine

## 2020-05-08 ENCOUNTER — Ambulatory Visit (INDEPENDENT_AMBULATORY_CARE_PROVIDER_SITE_OTHER): Payer: PPO | Admitting: Family Medicine

## 2020-05-08 ENCOUNTER — Encounter: Payer: Self-pay | Admitting: Family Medicine

## 2020-05-08 ENCOUNTER — Other Ambulatory Visit: Payer: Self-pay

## 2020-05-08 VITALS — BP 146/67 | HR 69 | Temp 98.0°F | Ht 62.0 in | Wt 120.0 lb

## 2020-05-08 DIAGNOSIS — E1169 Type 2 diabetes mellitus with other specified complication: Secondary | ICD-10-CM | POA: Diagnosis not present

## 2020-05-08 DIAGNOSIS — R300 Dysuria: Secondary | ICD-10-CM

## 2020-05-08 DIAGNOSIS — E785 Hyperlipidemia, unspecified: Secondary | ICD-10-CM

## 2020-05-08 LAB — MICROSCOPIC EXAMINATION: Renal Epithel, UA: NONE SEEN /hpf

## 2020-05-08 LAB — URINALYSIS, COMPLETE
Bilirubin, UA: NEGATIVE
Glucose, UA: NEGATIVE
Ketones, UA: NEGATIVE
Nitrite, UA: NEGATIVE
Protein,UA: NEGATIVE
RBC, UA: NEGATIVE
Specific Gravity, UA: 1.02 (ref 1.005–1.030)
Urobilinogen, Ur: 0.2 mg/dL (ref 0.2–1.0)
pH, UA: 5.5 (ref 5.0–7.5)

## 2020-05-08 MED ORDER — CEPHALEXIN 500 MG PO CAPS: 500.0000 mg | ORAL_CAPSULE | Freq: Four times a day (QID) | ORAL | 0 refills | Status: DC

## 2020-05-08 MED ORDER — RYBELSUS 7 MG PO TABS: 7.0000 mg | ORAL_TABLET | Freq: Every day | ORAL | 5 refills | Status: DC

## 2020-05-08 NOTE — Progress Notes (Signed)
 BP (!) 146/67   Pulse 69   Temp 98 F (36.7 C)   Ht 5' 2" (1.575 m)   Wt 120 lb (54.4 kg)   SpO2 97%   BMI 21.95 kg/m    Subjective:   Patient ID: Misty [name], female    DOB: 12/30/1950, 69 y.o.   MRN: [id number]  HPI: Misty [name] is a 69 y.o. female presenting on 05/08/2020 for Medical Management of Chronic Issues, Diabetes, and Hyperlipidemia   HPI Type 2 diabetes mellitus Patient comes in today for recheck of his diabetes. Patient has been currently taking glipizide and Metformin. Patient is not currently on an ACE inhibitor/ARB. Patient has not seen an ophthalmologist this year. Patient denies any issues with their feet. The symptom started onset as an adult hyperlipidemia ARE RELATED TO DM   Hyperlipidemia Patient is coming in for recheck of his hyperlipidemia. The patient is currently taking simvastatin. They deny any issues with myalgias or history of liver damage from it. They deny any focal numbness or weakness or chest pain.   Dysuria Patient has been having dysuria and frequency has been going on for the past 3 days.  She tried some Azo and it helped a little bit but not cleared up.  She says she has burning with urination each time that she urinates.  Relevant past medical, surgical, family and social history reviewed and updated as indicated. Interim medical history since our last visit reviewed. Allergies and medications reviewed and updated.  Review of Systems  Constitutional: Negative for chills and fever.  Eyes: Negative for visual disturbance.  Respiratory: Negative for chest tightness and shortness of breath.   Cardiovascular: Negative for chest pain and leg swelling.  Gastrointestinal: Negative for abdominal pain.  Genitourinary: Positive for dysuria, frequency and urgency. Negative for difficulty urinating, hematuria, vaginal bleeding, vaginal discharge and vaginal pain.  Musculoskeletal: Negative for back pain and gait problem.  Skin:  Negative for rash.  Neurological: Negative for light-headedness and headaches.  Psychiatric/Behavioral: Negative for agitation and behavioral problems.  All other systems reviewed and are negative.   Per HPI unless specifically indicated above   Allergies as of 05/08/2020      Reactions   Atorvastatin Other (See Comments)   MUSCLE PAIN       Medication List       Accurate as of May 08, 2020 10:33 AM. If you have any questions, ask your nurse or doctor.        blood glucose meter kit and supplies Kit Dispense based on patient and insurance preference. Use up to four times daily as directed. (FOR ICD-9 250.00, 250.01).   cephALEXin 500 MG capsule Commonly known as: KEFLEX Take 1 capsule (500 mg total) by mouth 4 (four) times daily. Started by:  A , MD   glipiZIDE 5 MG 24 hr tablet Commonly known as: GLUCOTROL XL TAKE ONE TABLET BY MOUTH DAIL Y   glucose blood test strip Commonly known as: ONE TOUCH ULTRA TEST USE 4 TIMES DAILY AS DRECTED Dx: E11.9   metFORMIN 500 MG 24 hr tablet Commonly known as: Glucophage XR Take 2 tablets (1,000 mg total) by mouth at bedtime.   onetouch ultrasoft lancets USE TO TEST BLOOD SUGAR 4 TIMES A DAY AS DIRECTED   Rybelsus 7 MG Tabs Generic drug: Semaglutide Take 7 mg by mouth daily. Started by:  A , MD   simvastatin 40 MG tablet Commonly known as: ZOCOR TAKE 1 TABLET BY MOUTH EVERY DAY     VISION FORMULA 2 PO Take 1 capsule by mouth at bedtime.        Objective:   BP (!) 146/67   Pulse 69   Temp 98 F (36.7 C)   Ht 5' 2" (1.575 m)   Wt 120 lb (54.4 kg)   SpO2 97%   BMI 21.95 kg/m   Wt Readings from Last 3 Encounters:  05/08/20 120 lb (54.4 kg)  01/31/20 122 lb (55.3 kg)  07/25/19 116 lb 12.8 oz (53 kg)    Physical Exam Vitals and nursing note reviewed.  Constitutional:      General: She is not in acute distress.    Appearance: She is well-developed. She is not diaphoretic.  Eyes:       Conjunctiva/sclera: Conjunctivae normal.  Cardiovascular:     Rate and Rhythm: Normal rate and regular rhythm.     Heart sounds: Normal heart sounds. No murmur heard.   Pulmonary:     Effort: Pulmonary effort is normal. No respiratory distress.     Breath sounds: Normal breath sounds. No wheezing.  Musculoskeletal:        General: No tenderness. Normal range of motion.  Skin:    General: Skin is warm and dry.     Findings: No rash.  Neurological:     Mental Status: She is alert and oriented to person, place, and time.     Coordination: Coordination normal.  Psychiatric:        Behavior: Behavior normal.     Urinalysis: 11-30 WBCs, 0-2 RBCs, few bacteria, yeast present, 0-10 epithelial cells, trace leukocytes  Assessment & Plan:   Problem List Items Addressed This Visit      Endocrine   Type 2 diabetes mellitus (HCC)   Relevant Medications   Semaglutide (RYBELSUS) 7 MG TABS   Hyperlipidemia associated with type 2 diabetes mellitus (HCC)   Relevant Medications   Semaglutide (RYBELSUS) 7 MG TABS    Other Visit Diagnoses    Burning with urination    -  Primary   Relevant Medications   cephALEXin (KEFLEX) 500 MG capsule   Other Relevant Orders   Urine Culture (Completed)   Urinalysis, Complete (Completed)      Will treat urine with Keflex possible infection with urine culture.  A1c is up to 7.7 We will add Rybelsus and give samples of 3 mg and sent prescription for 7 mg.  Please set up an appointment with Almyra Free for prescription assistance. Follow up plan: Return in about 3 months (around 08/08/2020), or if symptoms worsen or fail to improve, for Diabetes and hypertension recheck, needs appointment with Almyra Free.  Counseling provided for all of the vaccine components Orders Placed This Encounter  Procedures  . Urine Culture  . Urinalysis, Complete    Caryl Pina, MD [facility] 05/08/2020, 10:33 AM

## 2020-05-09 LAB — URINE CULTURE

## 2020-05-16 ENCOUNTER — Ambulatory Visit: Payer: PPO | Admitting: Pharmacist

## 2020-06-07 ENCOUNTER — Telehealth: Payer: Self-pay

## 2020-07-03 ENCOUNTER — Other Ambulatory Visit: Payer: Self-pay | Admitting: Family Medicine

## 2020-07-12 ENCOUNTER — Ambulatory Visit: Payer: PPO | Admitting: Licensed Clinical Social Worker

## 2020-07-12 DIAGNOSIS — D122 Benign neoplasm of ascending colon: Secondary | ICD-10-CM

## 2020-07-12 DIAGNOSIS — E1169 Type 2 diabetes mellitus with other specified complication: Secondary | ICD-10-CM

## 2020-07-12 NOTE — Patient Instructions (Addendum)
Licensed Clinical Education officer, museum Visit Information  Goals we discussed today:   Client will talk with LCSW and RNCM about challenges in managing client diabetes (pt-stated)         CARE PLAN ENTRY   Current Barriers:   Challenges in managing DM in patient with Chronic Diagnoses of Type 2 DM, HLD, Adenoma of Ascending Colon  Clinical Social Work Clinical Goal(s):   LCSW will call client in next 4 weeks to talk with client about challenges in managing diabetes  Interventions:   Talked with client about CCM program support  Talked with client about her job in [place], [place]   Talked with client about social support network (children are supportive; her spouse is supportive)  Talked with client about completion of ADLs  Talked with client about pain issues of client  Talked with client about her challenges in managing diabetes  Encouraged Josalynn to talk with RNCM as needed for nursing support  Talked with client about her upcoming medical appointments  Talked with client about sleeping challenges of client  Talked with client about transport needs of client  Patient Self Care Activities:   Does ADLs independently [place] car as needed Attends scheduled medical appointments  Patient Self Care Deficits:   Challenges in managing DM  Initial goal documentation    Follow Up Plan: LCSW to call client in next 4 weeks to talk with client about challenges in managing her diabetes  Materials Provided: No  The patient verbalized understanding of instructions provided today and declined a print copy of patient instruction materials.   [name].Jerusalem Brownstein MSW, LCSW Licensed Clinical Social Worker [facility] [phone]

## 2020-07-12 NOTE — Chronic Care Management (AMB) (Signed)
Chronic Care Management    Clinical Social Work Follow Up Note  07/12/2020 Name: Misty [name] MRN: [id number] DOB: [birth year]  Misty [name] is a 69 y.o. year old female who is a primary care patient of Dettinger, Fransisca Kaufmann, MD. The CCM team was consulted for assistance with [name] .   Review of patient status, including review of consultants reports, other relevant assessments, and collaboration with appropriate care team members and the patient's provider was performed as part of comprehensive patient evaluation and provision of chronic care management services.    SDOH (Social Determinants of Health) assessments performed: No; risk for tobacco use; risk for depression; risk for stress; risk for physical inactivity    Chronic Care Management from 03/28/2020 in [facility]  PHQ-9 Total Score 1     GAD 7 : Generalized Anxiety Score 03/28/2020  Nervous, Anxious, on Edge 0  Control/stop worrying 0  Worry too much - different things 0  Trouble relaxing 1  Restless 1  Easily annoyed or irritable 0  Afraid - awful might happen 0  Total GAD 7 Score 2  Anxiety Difficulty Somewhat difficult    Outpatient Encounter Medications as of 07/12/2020  Medication Sig  . blood glucose meter kit and supplies KIT Dispense based on patient and insurance preference. Use up to four times daily as directed. (FOR ICD-9 250.00, 250.01).  . cephALEXin (KEFLEX) 500 MG capsule Take 1 capsule (500 mg total) by mouth 4 (four) times daily.  Marland Kitchen glipiZIDE (GLUCOTROL XL) 5 MG 24 hr tablet TAKE ONE TABLET BY MOUTH DAIL Y  . Lancets (ONETOUCH ULTRASOFT) lancets USE TO TEST BLOOD SUGAR 4 TIMES A DAY AS DIRECTED  . metFORMIN (GLUCOPHAGE XR) 500 MG 24 hr tablet Take 2 tablets (1,000 mg total) by mouth at bedtime.  . Multiple Vitamins-Minerals (VISION FORMULA 2 PO) Take 1 capsule by mouth at bedtime.  Misty [name] ULTRA test strip USE 4 TIMES DAILY AS DRECTED DX: E11.9  .  Semaglutide (RYBELSUS) 7 MG TABS Take 7 mg by mouth daily.  . simvastatin (ZOCOR) 40 MG tablet TAKE 1 TABLET BY MOUTH EVERY DAY   No facility-administered encounter medications on file as of 07/12/2020.    Goals    .  Client will talk with LCSW and RNCM about challenges in managing client diabetes (pt-stated)      CARE PLAN ENTRY   Current Barriers:   Challenges in managing DM in patient with Chronic Diagnoses of Type 2 DM, HLD, Adenoma of Ascending Colon  Clinical Social Work Clinical Goal(s):   LCSW will call client in next 4 weeks to talk with client about challenges in managing diabetes  Interventions:  . Talked with client about CCM program support . Talked with client about her job in [place], [place]  . Talked with client about social support network (children are supportive; her spouse is supportive) . Talked with client about completion of ADLs . Talked with client about pain issues of client . Talked with client about her challenges in managing diabetes . Encouraged [name] to talk with [facility] as needed for nursing support . Talked with client about her upcoming medical appointments . Talked with client about sleeping challenges of client . Talked with client about transport needs of client  Patient Self Care Activities:   Does ADLs independently Drives car as needed Attends scheduled medical appointments  Patient Self Care Deficits:  . Challenges in managing DM  Initial goal documentation    Follow Up  Plan: LCSW to call client in next 4 weeks to talk with client about challenges in managing her diabetes  [name].Mellie Buccellato MSW, LCSW Licensed Clinical Social Worker [facility] [phone]

## 2020-07-28 ENCOUNTER — Other Ambulatory Visit: Payer: Self-pay | Admitting: Family Medicine

## 2020-07-28 DIAGNOSIS — E119 Type 2 diabetes mellitus without complications: Secondary | ICD-10-CM

## 2020-07-28 DIAGNOSIS — E785 Hyperlipidemia, unspecified: Secondary | ICD-10-CM

## 2020-08-14 ENCOUNTER — Ambulatory Visit: Payer: PPO | Admitting: Family Medicine

## 2020-08-16 ENCOUNTER — Ambulatory Visit: Payer: PPO | Admitting: Licensed Clinical Social Worker

## 2020-08-16 DIAGNOSIS — D122 Benign neoplasm of ascending colon: Secondary | ICD-10-CM

## 2020-08-16 DIAGNOSIS — E1169 Type 2 diabetes mellitus with other specified complication: Secondary | ICD-10-CM

## 2020-08-16 NOTE — Patient Instructions (Addendum)
Licensed Clinical Education officer, museum Visit Information  Goals we discussed today:    Client will talk with LCSW and RNCM about challenges in managing client diabetes (pt-stated)        CARE PLAN ENTRY   Current Barriers:   Challenges in managing DM in patient with Chronic Diagnoses of Type 2 DM, HLD, Adenoma of Ascending Colon  Clinical Social Work Clinical Goal(s):   LCSW will call client in next 4 weeks to talk with client about challenges in managing diabetes  Interventions:   Talked with client about CCM program support  Talked with client  about her job responsibilities  Talked with client about social support network (children are supportive)  Talked with client about completion of ADLs  Talked with client about pain issues of client  Talked with client about her challenges in managing diabetes  Talked with client about her exercise at local gym and about her walking regularly at her job  Encouraged Misty [name] to talk with RNCM as needed for nursing support  Talked with client about her upcoming medical appointments  Talked with client about sleeping challenges of client  Talked with client about transport needs of client  Talked with client about vision of client  Patient Self Care Activities:   Does ADLs independently Misty [name] car as needed Attends scheduled medical appointments  Patient Self Care Deficits:   Challenges in managing DM   Initial goal documentation    Follow Up Plan: LCSW to call client in next 4 weeks to talk with client about challenges in managing her diabetes  Materials Provided:  No  The patient verbalized understanding of instructions provided today and declined a print copy of patient instruction materials.   Misty [name].Misty [name] MSW, LCSW Licensed Clinical Social Worker [facility] [phone]

## 2020-08-16 NOTE — Chronic Care Management (AMB) (Signed)
Chronic Care Management    Clinical Social Work Follow Up Note  08/16/2020 Name: Misty [name] MRN: [id number] DOB: 03-Dec-1950  Misty [name] is a 69 y.o. year old female who is a primary care patient of Dettinger, Fransisca Kaufmann, MD. The CCM team was consulted for assistance with [name] .   Review of patient status, including review of consultants reports, other relevant assessments, and collaboration with appropriate care team members and the patient's provider was performed as part of comprehensive patient evaluation and provision of chronic care management services.    SDOH (Social Determinants of Health) assessments performed: No; risk for depression; risk for tobacco use; risk for stress; risk for physical inactivity    Chronic Care Management from 03/28/2020 in [place]  PHQ-9 Total Score 1     GAD 7 : Generalized Anxiety Score 03/28/2020  Nervous, Anxious, on Edge 0  Control/stop worrying 0  Worry too much - different things 0  Trouble relaxing 1  Restless 1  Easily annoyed or irritable 0  Afraid - awful might happen 0  Total GAD 7 Score 2  Anxiety Difficulty Somewhat difficult    Outpatient Encounter Medications as of 08/16/2020  Medication Sig  . blood glucose meter kit and supplies KIT Dispense based on patient and insurance preference. Use up to four times daily as directed. (FOR ICD-9 250.00, 250.01).  . cephALEXin (KEFLEX) 500 MG capsule Take 1 capsule (500 mg total) by mouth 4 (four) times daily.  Marland Kitchen glipiZIDE (GLUCOTROL XL) 5 MG 24 hr tablet TAKE ONE TABLET BY MOUTH DAIL Y  . Lancets (ONETOUCH ULTRASOFT) lancets USE TO TEST BLOOD SUGAR 4 TIMES A DAY AS DIRECTED  . metFORMIN (GLUCOPHAGE XR) 500 MG 24 hr tablet Take 2 tablets (1,000 mg total) by mouth at bedtime.  . Multiple Vitamins-Minerals (VISION FORMULA 2 PO) Take 1 capsule by mouth at bedtime.  Misty [name] ULTRA test strip USE 4 TIMES DAILY AS DRECTED DX: E11.9  .  Semaglutide (RYBELSUS) 7 MG TABS Take 7 mg by mouth daily.  . simvastatin (ZOCOR) 40 MG tablet TAKE 1 TABLET BY MOUTH EVERY DAY   No facility-administered encounter medications on file as of 08/16/2020.    Goals    .  Client will talk with LCSW and RNCM about challenges in managing client diabetes (pt-stated)      CARE PLAN ENTRY   Current Barriers:   Challenges in managing DM in patient with Chronic Diagnoses of Type 2 DM, HLD, Adenoma of Ascending Colon  Clinical Social Work Clinical Goal(s):   LCSW will call client in next 4 weeks to talk with client about challenges in managing diabetes  Interventions:  . Talked with client about CCM program support . Talked with client  about her job responsibilities . Talked with client about social support network (children are supportive) . Talked with client about completion of ADLs . Talked with client about pain issues of client . Talked with client about her challenges in managing diabetes . Talked with client about her exercise at local gym and about her walking regularly at her job . Encouraged Misty [name] to talk with [facility] as needed for nursing support . Talked with client about her upcoming medical appointments . Talked with client about sleeping challenges of client . Talked with client about transport needs of client . Talked with client about vision of client  Patient Self Care Activities:   Does ADLs independently Drives car as needed Attends scheduled medical appointments  Patient Self Care Deficits:  . Challenges in managing DM   Initial goal documentation    Follow Up Plan: LCSW to call client in next 4 weeks to talk with client about challenges in managing her diabetes  [name].Robynne Roat MSW, LCSW Licensed Clinical Social Worker [facility] [phone]

## 2020-09-06 ENCOUNTER — Ambulatory Visit (INDEPENDENT_AMBULATORY_CARE_PROVIDER_SITE_OTHER): Payer: PPO | Admitting: Licensed Clinical Social Worker

## 2020-09-06 DIAGNOSIS — E785 Hyperlipidemia, unspecified: Secondary | ICD-10-CM

## 2020-09-06 DIAGNOSIS — E1169 Type 2 diabetes mellitus with other specified complication: Secondary | ICD-10-CM

## 2020-09-06 DIAGNOSIS — D122 Benign neoplasm of ascending colon: Secondary | ICD-10-CM

## 2020-09-06 NOTE — Chronic Care Management (AMB) (Signed)
Chronic Care Management    Clinical Social Work Follow Up Note  09/06/2020 Name: Misty [name] MRN: [id number] DOB: Dec 05, 1950  Misty [name] is a 69 y.o. year old female who is a primary care patient of Dettinger, Fransisca Kaufmann, MD. The CCM team was consulted for assistance with [name] .   Review of patient status, including review of consultants reports, other relevant assessments, and collaboration with appropriate care team members and the patient's provider was performed as part of comprehensive patient evaluation and provision of chronic care management services.    SDOH (Social Determinants of Health) assessments performed: No;risk for depression; risk for tobacco use; risk for stress; risk for physical inactivity    Chronic Care Management from 03/28/2020 in [place]  PHQ-9 Total Score 1       GAD 7 : Generalized Anxiety Score 03/28/2020  Nervous, Anxious, on Edge 0  Control/stop worrying 0  Worry too much - different things 0  Trouble relaxing 1  Restless 1  Easily annoyed or irritable 0  Afraid - awful might happen 0  Total GAD 7 Score 2  Anxiety Difficulty Somewhat difficult    Outpatient Encounter Medications as of 09/06/2020  Medication Sig  . blood glucose meter kit and supplies KIT Dispense based on patient and insurance preference. Use up to four times daily as directed. (FOR ICD-9 250.00, 250.01).  . cephALEXin (KEFLEX) 500 MG capsule Take 1 capsule (500 mg total) by mouth 4 (four) times daily.  Marland Kitchen glipiZIDE (GLUCOTROL XL) 5 MG 24 hr tablet TAKE ONE TABLET BY MOUTH DAIL Y  . Lancets (ONETOUCH ULTRASOFT) lancets USE TO TEST BLOOD SUGAR 4 TIMES A DAY AS DIRECTED  . metFORMIN (GLUCOPHAGE XR) 500 MG 24 hr tablet Take 2 tablets (1,000 mg total) by mouth at bedtime.  . Multiple Vitamins-Minerals (VISION FORMULA 2 PO) Take 1 capsule by mouth at bedtime.  Misty [name] test strip USE 4 TIMES DAILY AS DRECTED DX: E11.9  .  Semaglutide (RYBELSUS) 7 MG TABS Take 7 mg by mouth daily.  . simvastatin (ZOCOR) 40 MG tablet TAKE 1 TABLET BY MOUTH EVERY DAY   No facility-administered encounter medications on file as of 09/06/2020.    Goals    .  Client will talk with LCSW and RNCM about challenges in managing client diabetes (pt-stated)      CARE PLAN ENTRY   Current Barriers:   Challenges in managing DM in patient with Chronic Diagnoses of Type 2 DM, HLD, Adenoma of Ascending Colon  Clinical Social Work Clinical Goal(s):   LCSW will call client in next 4 weeks to talk with client about challenges in managing diabetes  Interventions:  . Talked with client about her job responsibilities . Talked with client about social support network (children are supportive) . Talked with client about completion of ADLs . Talked with client about pain issues of client . Talked with client about her challenges in managing diabetes . Encouraged [place] to talk with [facility] as needed for nursing support . Talked with client about her upcoming medical appointments . Talked with client about sleeping challenges of client . Talked with client about decreased appetite of client . Talked with client about vision of client . Talked with client about support for client with Dr. Lottie Dawson, pharmacist with [facility]  Patient Self Care Activities:   Does ADLs independently Drives car as needed Attends scheduled medical appointments  Patient Self Care Deficits:  . Challenges in managing DM  Initial goal documentation     Follow Up Plan: LCSW to call client in next 4 weeks to talk with client about challenges in managing her diabetes  [name].Elianah Karis MSW, LCSW Licensed Clinical Social Worker [facility] [phone]

## 2020-09-06 NOTE — Patient Instructions (Addendum)
Licensed Clinical Social Worker Visit Information  Goals we discussed today:   .  Client will talk with LCSW and RNCM about challenges in managing client diabetes (pt-stated)        CARE PLAN ENTRY   Current Barriers:   Challenges in managing DM in patient with Chronic Diagnoses of Type 2 DM, HLD, Adenoma of Ascending Colon  Clinical Social Work Clinical Goal(s):   LCSW will call client in next 4 weeks to talk with client about challenges in managing diabetes  Interventions:   Talked with client about her job responsibilities  Talked with client about social support network (children are supportive)  Talked with client about completion of ADLs  Talked with client about pain issues of client  Talked with client about her challenges in managing diabetes  Encouraged [place] to talk with RNCM as needed for nursing support  Talked with client about her upcoming medical appointments  Talked with client about sleeping challenges of client  Talked with client about decreased appetite of client  Talked with client about vision of client  Talked with client about support for client with Dr. Lottie Dawson, pharmacist with [facility]  Patient Self Care Activities:   Does ADLs independently Drives car as needed Attends scheduled medical appointments  Patient Self Care Deficits:   Challenges in managing DM   Initial goal documentation     Follow Up Plan: LCSW to call client in next 4 weeks to talk with client about challenges in managing her diabetes  Materials Provided: No  The patient verbalized understanding of instructions provided today and declined a print copy of patient instruction materials.   [name].Nary Sneed MSW, LCSW Licensed Clinical Social Worker [facility] [phone]

## 2020-09-21 DIAGNOSIS — H2513 Age-related nuclear cataract, bilateral: Secondary | ICD-10-CM | POA: Diagnosis not present

## 2020-09-21 DIAGNOSIS — H40033 Anatomical narrow angle, bilateral: Secondary | ICD-10-CM | POA: Diagnosis not present

## 2020-09-23 ENCOUNTER — Other Ambulatory Visit: Payer: Self-pay

## 2020-09-23 ENCOUNTER — Other Ambulatory Visit: Payer: PPO

## 2020-09-23 DIAGNOSIS — E1169 Type 2 diabetes mellitus with other specified complication: Secondary | ICD-10-CM

## 2020-09-23 DIAGNOSIS — E785 Hyperlipidemia, unspecified: Secondary | ICD-10-CM

## 2020-09-23 LAB — BAYER DCA HB A1C WAIVED: HB A1C (BAYER DCA - WAIVED): 7.2 % — ABNORMAL HIGH (ref <7.0)

## 2020-09-24 LAB — CMP14+EGFR
ALT: 19 IU/L (ref 0–32)
AST: 20 IU/L (ref 0–40)
Albumin/Globulin Ratio: 2.2 (ref 1.2–2.2)
Albumin: 4.3 g/dL (ref 3.8–4.8)
Alkaline Phosphatase: 77 IU/L (ref 44–121)
BUN/Creatinine Ratio: 24 (ref 12–28)
BUN: 18 mg/dL (ref 8–27)
Bilirubin Total: 0.3 mg/dL (ref 0.0–1.2)
CO2: 25 mmol/L (ref 20–29)
Calcium: 9 mg/dL (ref 8.7–10.3)
Chloride: 105 mmol/L (ref 96–106)
Creatinine, Ser: 0.76 mg/dL (ref 0.57–1.00)
GFR calc Af Amer: 92 mL/min/{1.73_m2} (ref >59)
GFR calc non Af Amer: 80 mL/min/{1.73_m2} (ref >59)
Globulin, Total: 2 g/dL (ref 1.5–4.5)
Glucose: 126 mg/dL — ABNORMAL HIGH (ref 65–99)
Potassium: 4.2 mmol/L (ref 3.5–5.2)
Sodium: 143 mmol/L (ref 134–144)
Total Protein: 6.3 g/dL (ref 6.0–8.5)

## 2020-09-25 ENCOUNTER — Ambulatory Visit: Payer: PPO

## 2020-09-25 ENCOUNTER — Other Ambulatory Visit: Payer: Self-pay

## 2020-09-25 ENCOUNTER — Encounter: Payer: Self-pay | Admitting: Family Medicine

## 2020-09-25 ENCOUNTER — Ambulatory Visit (INDEPENDENT_AMBULATORY_CARE_PROVIDER_SITE_OTHER): Payer: PPO | Admitting: Family Medicine

## 2020-09-25 VITALS — BP 119/88 | HR 84 | Temp 97.6°F | Ht 62.0 in | Wt 116.0 lb

## 2020-09-25 DIAGNOSIS — E1169 Type 2 diabetes mellitus with other specified complication: Secondary | ICD-10-CM | POA: Diagnosis not present

## 2020-09-25 DIAGNOSIS — Z780 Asymptomatic menopausal state: Secondary | ICD-10-CM | POA: Diagnosis not present

## 2020-09-25 DIAGNOSIS — E785 Hyperlipidemia, unspecified: Secondary | ICD-10-CM

## 2020-09-25 DIAGNOSIS — Z23 Encounter for immunization: Secondary | ICD-10-CM

## 2020-09-25 NOTE — Progress Notes (Signed)
BP 119/88   Pulse 84   Temp 97.6 F (36.4 C)   Ht 5' 2"  (1.575 m)   Wt 116 lb (52.6 kg)   SpO2 98%   BMI 21.22 kg/m    Subjective:   Patient ID: Misty [name], female    DOB: 02/25/1951, 69 y.o.   MRN: [id number]  HPI: Misty [name] is a 69 y.o. female presenting on 09/25/2020 for Medical Management of Chronic Issues, Diabetes, Hyperlipidemia, and Elbow Pain (left)   HPI Type 2 diabetes mellitus Patient comes in today for recheck of his diabetes. Patient has been currently taking Metformin and glipizide, did not tolerate the Rybelsus.. Patient is not currently on an ACE inhibitor/ARB. Patient has seen an ophthalmologist this year. Patient denies any issues with their feet. The symptom started onset as an adult hyperlipidemia ARE RELATED TO DM   Hyperlipidemia Patient is coming in for recheck of his hyperlipidemia. The patient is currently taking simvastatin,. They deny any issues with myalgias or history of liver damage from it. They deny any focal numbness or weakness or chest pain.   Patient is currently having some left elbow pain although it is much improved, she is an over-the-counter cream, it was very tender left lateral elbow but notes much better since she started using the cream that she found over-the-counter.  Relevant past medical, surgical, family and social history reviewed and updated as indicated. Interim medical history since our last visit reviewed. Allergies and medications reviewed and updated.  Review of Systems  Constitutional: Negative for chills and fever.  Eyes: Negative for visual disturbance.  Respiratory: Negative for chest tightness and shortness of breath.   Cardiovascular: Negative for chest pain and leg swelling.  Genitourinary: Negative for difficulty urinating and dysuria.  Musculoskeletal: Negative for back pain and gait problem.  Skin: Negative for rash.  Neurological: Negative for light-headedness and headaches.    Psychiatric/Behavioral: Negative for agitation and behavioral problems.  All other systems reviewed and are negative.   Per HPI unless specifically indicated above   Allergies as of 09/25/2020      Reactions   Atorvastatin Other (See Comments)   MUSCLE PAIN       Medication List       Accurate as of September 25, 2020 11:20 AM. If you have any questions, ask your nurse or doctor.        STOP taking these medications   cephALEXin 500 MG capsule Commonly known as: KEFLEX Stopped by: Worthy Rancher, MD   Rybelsus 7 MG Tabs Generic drug: Semaglutide Stopped by: Fransisca Kaufmann Talor Desrosiers, MD     TAKE these medications   blood glucose meter kit and supplies Kit Dispense based on patient and insurance preference. Use up to four times daily as directed. (FOR ICD-9 250.00, 250.01).   glipiZIDE 5 MG 24 hr tablet Commonly known as: GLUCOTROL XL TAKE ONE TABLET BY MOUTH DAIL Y   metFORMIN 500 MG 24 hr tablet Commonly known as: Glucophage XR Take 2 tablets (1,000 mg total) by mouth at bedtime.   OneTouch Ultra test strip Generic drug: glucose blood USE 4 TIMES DAILY AS DRECTED DX: E11.9   onetouch ultrasoft lancets USE TO TEST BLOOD SUGAR 4 TIMES A DAY AS DIRECTED   simvastatin 40 MG tablet Commonly known as: ZOCOR TAKE 1 TABLET BY MOUTH EVERY DAY   VISION FORMULA 2 PO Take 1 capsule by mouth at bedtime.        Objective:   BP 119/88  Pulse 84   Temp 97.6 F (36.4 C)   Ht 5' 2"  (1.575 m)   Wt 116 lb (52.6 kg)   SpO2 98%   BMI 21.22 kg/m   Wt Readings from Last 3 Encounters:  09/25/20 116 lb (52.6 kg)  05/08/20 120 lb (54.4 kg)  01/31/20 122 lb (55.3 kg)    Physical Exam Vitals and nursing note reviewed.  Constitutional:      General: She is not in acute distress.    Appearance: She is well-developed. She is not diaphoretic.  Eyes:     Conjunctiva/sclera: Conjunctivae normal.  Cardiovascular:     Rate and Rhythm: Normal rate and regular rhythm.      Heart sounds: Normal heart sounds. No murmur heard.   Pulmonary:     Effort: Pulmonary effort is normal. No respiratory distress.     Breath sounds: Normal breath sounds. No wheezing.  Musculoskeletal:        General: No tenderness. Normal range of motion.  Skin:    General: Skin is warm and dry.     Findings: No rash.  Neurological:     Mental Status: She is alert and oriented to person, place, and time.     Coordination: Coordination normal.  Psychiatric:        Behavior: Behavior normal.     Results for orders placed or performed in visit on 09/23/20  Bayer DCA Hb A1c Waived  Result Value Ref Range   HB A1C (BAYER DCA - WAIVED) 7.2 (H) <7.0 %    Assessment & Plan:   Problem List Items Addressed This Visit      Endocrine   Type 2 diabetes mellitus ([place]) - Primary   Relevant Orders   Microalbumin / creatinine urine ratio   CBC with Differential/Platelet   CMP14+EGFR   Bayer DCA Hb A1c Waived   Microalbumin / creatinine urine ratio   Hyperlipidemia associated with type 2 diabetes mellitus ([place])   Relevant Orders   Lipid panel    Other Visit Diagnoses    Need for immunization against influenza       Relevant Orders   Flu Vaccine QUAD High Dose(Fluad) (Completed)   Postmenopausal       Relevant Orders   DG WR[facility] DEXA   CBC with Differential/Platelet      Recheck blood work, come back in 3 months, A1c improved to 7.2 Follow up plan: Return in about 3 months (around 12/26/2020), or if symptoms worsen or fail to improve, for Diabetes and hyperlipidemia.  Counseling provided for all of the vaccine components Orders Placed This Encounter  Procedures  . DG WRFM DEXA  . Flu Vaccine QUAD High Dose(Fluad)  . Microalbumin / creatinine urine ratio  . CBC with Differential/Platelet  . CMP14+EGFR  . Lipid panel  . Bayer DCA Hb A1c Waived  . Microalbumin / creatinine urine ratio    Caryl Pina, MD [facility] 09/25/2020, 11:20  AM

## 2020-09-26 LAB — MICROALBUMIN / CREATININE URINE RATIO
Creatinine, Urine: 99.5 mg/dL
Microalb/Creat Ratio: 11 mg/g creat (ref 0–29)
Microalbumin, Urine: 11.1 ug/mL

## 2020-10-09 ENCOUNTER — Telehealth: Payer: PPO

## 2020-11-12 ENCOUNTER — Other Ambulatory Visit: Payer: Self-pay | Admitting: Family Medicine

## 2020-11-12 DIAGNOSIS — E119 Type 2 diabetes mellitus without complications: Secondary | ICD-10-CM

## 2020-11-12 DIAGNOSIS — E1169 Type 2 diabetes mellitus with other specified complication: Secondary | ICD-10-CM

## 2020-11-13 ENCOUNTER — Other Ambulatory Visit: Payer: Self-pay | Admitting: Family Medicine

## 2020-11-13 DIAGNOSIS — E1169 Type 2 diabetes mellitus with other specified complication: Secondary | ICD-10-CM

## 2020-11-18 ENCOUNTER — Ambulatory Visit: Payer: PPO | Admitting: Licensed Clinical Social Worker

## 2020-11-18 DIAGNOSIS — E1169 Type 2 diabetes mellitus with other specified complication: Secondary | ICD-10-CM

## 2020-11-18 DIAGNOSIS — E119 Type 2 diabetes mellitus without complications: Secondary | ICD-10-CM

## 2020-11-18 DIAGNOSIS — D122 Benign neoplasm of ascending colon: Secondary | ICD-10-CM

## 2020-11-18 NOTE — Chronic Care Management (AMB) (Signed)
  Chronic Care Management    Clinical Social Work Follow Up Note  11/18/2020 Name: Misty [name] MRN: [id number] DOB: Nov 22, 1950  Misty [name] is a 70 y.o. year old female who is a primary care patient of Dettinger, Misty Kaufmann, MD. The CCM team was consulted for assistance with Misty [name] .   Review of patient status, including review of consultants reports, other relevant assessments, and collaboration with appropriate care team members and the patient's provider was performed as part of comprehensive patient evaluation and provision of chronic care management services.    SDOH (Social Determinants of Health) assessments performed: No; risk for depression; risk for tobacco use; risk for stress; risk for financial strain  Flowsheet Row Chronic Care Management from 03/28/2020 in [place]  PHQ-9 Total Score 1     GAD 7 : Generalized Anxiety Score 03/28/2020  Nervous, Anxious, on Edge 0  Control/stop worrying 0  Worry too much - different things 0  Trouble relaxing 1  Restless 1  Easily annoyed or irritable 0  Afraid - awful might happen 0  Total GAD 7 Score 2  Anxiety Difficulty Somewhat difficult    Outpatient Encounter Medications as of 11/18/2020  Medication Sig  . blood glucose meter kit and supplies KIT Dispense based on patient and insurance preference. Use up to four times daily as directed. (FOR ICD-9 250.00, 250.01).  Marland Kitchen glipiZIDE (GLUCOTROL XL) 5 MG 24 hr tablet TAKE ONE TABLET BY MOUTH DAIL Y  . Lancets (ONETOUCH ULTRASOFT) lancets USE TO TEST BLOOD SUGAR 4 TIMES A DAY AS DIRECTED  . metFORMIN (GLUCOPHAGE-XR) 500 MG 24 hr tablet TAKE 2 TABLETS (1,000 MG TOTAL) BY MOUTH AT BEDTIME.  . Multiple Vitamins-Minerals (VISION FORMULA 2 PO) Take 1 capsule by mouth at bedtime.  Misty [name] test strip USE 4 TIMES DAILY AS DRECTED DX: E11.9  . simvastatin (ZOCOR) 40 MG tablet TAKE 1 TABLET BY MOUTH EVERY DAY   No facility-administered  encounter medications on file as of 11/18/2020.    LCSW called home phone number for client today and did not reach client. LCSW called cell phone number of client today and did not reach client .LCSW did leave phone message for client to please return call to LCSW at [phone]  Follow Up Plan: LCSW to call client in next 4 weeks to talk with client about challenges in managing her diabetes  Misty [name] MSW, LCSW Licensed Clinical Social Worker [facility] [phone]

## 2020-11-18 NOTE — Patient Instructions (Addendum)
Licensed Clinical Social Worker Visit Information  Materials Provided: No  11/18/2020  Name: Misty [name] MRN: [id number] DOB: April 15, 1951  Misty [name] is a 70 y.o. year old female who is a primary care patient of Dettinger, Fransisca Kaufmann, MD. The CCM team was consulted for assistance with [name] .   Review of patient status, including review of consultants reports, other relevant assessments, and collaboration with appropriate care team members and the patient's provider was performed as part of comprehensive patient evaluation and provision of chronic care management services.    SDOH (Social Determinants of Health) assessments performed: No; risk for depression; risk for tobacco use; risk for stress; risk for financial strain  LCSW called home phone number for client today and did not reach client. LCSW called cell phone number of client today and did not reach client .LCSW did leave phone message for client to please return call to LCSW at [phone]  Follow Up Plan: LCSW to call client in next 4 weeks to talk with client about challenges in managing her diabetes  LCSW was not able to speak via phone today with client; thus, client was not able to verbalize understanding of instructions provided today and was not able to accept or decline a print copy of patient instruction materials.   Misty [name] MSW, LCSW Licensed Clinical Social Worker [facility] [phone]

## 2020-12-05 ENCOUNTER — Encounter: Payer: Self-pay | Admitting: Family Medicine

## 2020-12-05 ENCOUNTER — Ambulatory Visit (INDEPENDENT_AMBULATORY_CARE_PROVIDER_SITE_OTHER): Payer: PPO | Admitting: Family Medicine

## 2020-12-05 DIAGNOSIS — Z20822 Contact with and (suspected) exposure to covid-19: Secondary | ICD-10-CM | POA: Diagnosis not present

## 2020-12-05 DIAGNOSIS — R059 Cough, unspecified: Secondary | ICD-10-CM

## 2020-12-05 LAB — VERITOR FLU A/B WAIVED
Influenza A: NEGATIVE
Influenza B: NEGATIVE

## 2020-12-05 MED ORDER — BENZONATATE 100 MG PO CAPS: 100.0000 mg | ORAL_CAPSULE | Freq: Three times a day (TID) | ORAL | 1 refills | Status: DC | PRN
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
## 2020-12-05 NOTE — Addendum Note (Signed)
Addended by: Liliane Bade on: 12/05/2020 10:25 AM   Modules accepted: Orders

## 2020-12-06 LAB — SARS-COV-2, NAA 2 DAY TAT

## 2020-12-06 LAB — NOVEL CORONAVIRUS, NAA: SARS-CoV-2, NAA: DETECTED — AB

## 2020-12-07 ENCOUNTER — Telehealth (HOSPITAL_COMMUNITY): Payer: Self-pay | Admitting: Family

## 2020-12-07 NOTE — Telephone Encounter (Signed)
Called to discuss with patient about COVID-19 symptoms and the use of one of the available treatments for those with mild to moderate Covid symptoms and at a high risk of hospitalization.  Pt appears to qualify for outpatient treatment due to co-morbid conditions and/or a member of an at-risk group in accordance with the FDA Emergency Use Authorization.    Unable to reach pt - VM left  Tamorah Hada   

## 2020-12-20 ENCOUNTER — Ambulatory Visit (INDEPENDENT_AMBULATORY_CARE_PROVIDER_SITE_OTHER): Payer: PPO | Admitting: Licensed Clinical Social Worker

## 2020-12-20 DIAGNOSIS — E119 Type 2 diabetes mellitus without complications: Secondary | ICD-10-CM | POA: Diagnosis not present

## 2020-12-20 DIAGNOSIS — E1169 Type 2 diabetes mellitus with other specified complication: Secondary | ICD-10-CM

## 2020-12-20 DIAGNOSIS — D122 Benign neoplasm of ascending colon: Secondary | ICD-10-CM

## 2020-12-20 DIAGNOSIS — E785 Hyperlipidemia, unspecified: Secondary | ICD-10-CM

## 2020-12-20 NOTE — Patient Instructions (Addendum)
Licensed Clinical Social Worker Visit Information  Goals we discussed today:   .  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 [place] Talked with client about transport needs Talked with client about decreased energy Talked with client about health needs of her spouse Talked with client about sleeping issues Talked with client  about medication procurement of client  Talked with client about appetite of client (she said she is drinking one can of Ensure daily; she said she is having a decreased appetite) Talked with client about her upcoming medical appointments Talked with client about challenges in managing Diabetes Talked with client about mobility of client  Encouraged client to call RNCM as needed for nursing support  Patient Self Care Activities:   Does ADLs independently Drives car as needed Attends scheduled medical appointments  Patient Self Care Deficits:   Challenges in managing DM    Initial goal documentation     Follow Up Plan:LCSW to call client in next 4 weeks to talk with client about challenges in managing her diabetes  Materials Provided: No  The patient verbalized understanding of instructions provided today and declined a print copy of patient instruction materials.   [name].Chrisoula Zegarra MSW, LCSW Licensed Clinical Social Worker [facility] [phone]

## 2020-12-20 NOTE — Chronic Care Management (AMB) (Signed)
Chronic Care Management    Clinical Social Work Follow Up Note  12/20/2020 Name: Misty [name] MRN: [id number] DOB: [birth year]  Misty [name] is a 70 y.o. year old female who is a primary care patient of Dettinger, Fransisca Kaufmann, MD. The CCM team was consulted for assistance with [name] .   Review of patient status, including review of consultants reports, other relevant assessments, and collaboration with appropriate care team members and the patient's provider was performed as part of comprehensive patient evaluation and provision of chronic care management services.    SDOH (Social Determinants of Health) assessments performed: No; risk for depression; risk for tobacco use; risk for stress; risk for physical inactivity  Flowsheet Row Chronic Care Management from 03/28/2020 in [place]  PHQ-9 Total Score 1      GAD 7 : Generalized Anxiety Score 03/28/2020  Nervous, Anxious, on Edge 0  Control/stop worrying 0  Worry too much - different things 0  Trouble relaxing 1  Restless 1  Easily annoyed or irritable 0  Afraid - awful might happen 0  Total GAD 7 Score 2  Anxiety Difficulty Somewhat difficult   Outpatient Encounter Medications as of 12/20/2020  Medication Sig  . benzonatate (TESSALON PERLES) 100 MG capsule Take 1 capsule (100 mg total) by mouth 3 (three) times daily as needed for cough.  . blood glucose meter kit and supplies KIT Dispense based on patient and insurance preference. Use up to four times daily as directed. (FOR ICD-9 250.00, 250.01).  Marland Kitchen glipiZIDE (GLUCOTROL XL) 5 MG 24 hr tablet TAKE ONE TABLET BY MOUTH DAIL Y  . Lancets (ONETOUCH ULTRASOFT) lancets USE TO TEST BLOOD SUGAR 4 TIMES A DAY AS DIRECTED  . metFORMIN (GLUCOPHAGE-XR) 500 MG 24 hr tablet TAKE 2 TABLETS (1,000 MG TOTAL) BY MOUTH AT BEDTIME.  . Multiple Vitamins-Minerals (VISION FORMULA 2 PO) Take 1 capsule by mouth at bedtime.  [name] test strip USE 4  TIMES DAILY AS DRECTED DX: E11.9  . simvastatin (ZOCOR) 40 MG tablet TAKE 1 TABLET BY MOUTH EVERY DAY   No facility-administered encounter medications on file as of 12/20/2020.    Goals    .  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 [place] Talked with client about transport needs Talked with client about decreased energy Talked with client about health needs of her spouse Talked with client about sleeping issues Talked with client  about medication procurement of client  Talked with client about appetite of client (she said she is drinking one can of Ensure daily; she said she is having a decreased appetite) Talked with client about her upcoming medical appointments Talked with client about challenges in managing Diabetes Talked with client about mobility of client  Encouraged client to call RNCM as needed for nursing support  Patient Self Care Activities:   Does ADLs independently Drives car as needed Attends scheduled medical appointments  Patient Self Care  Deficits:  . Challenges in managing DM    Initial goal documentation     Follow Up Plan: LCSW to call client in next 4 weeks to talk with client about challenges in managing her diabetes  [name].Bethzy Hauck MSW, LCSW Licensed Clinical Social Worker [facility] [phone]

## 2020-12-27 ENCOUNTER — Other Ambulatory Visit: Payer: PPO

## 2020-12-27 ENCOUNTER — Ambulatory Visit: Payer: PPO | Admitting: Family Medicine

## 2021-01-22 ENCOUNTER — Telehealth: Payer: PPO

## 2021-01-29 ENCOUNTER — Other Ambulatory Visit: Payer: PPO

## 2021-01-29 ENCOUNTER — Other Ambulatory Visit: Payer: Self-pay

## 2021-01-29 DIAGNOSIS — E785 Hyperlipidemia, unspecified: Secondary | ICD-10-CM | POA: Diagnosis not present

## 2021-01-29 DIAGNOSIS — E1169 Type 2 diabetes mellitus with other specified complication: Secondary | ICD-10-CM | POA: Diagnosis not present

## 2021-01-29 DIAGNOSIS — E119 Type 2 diabetes mellitus without complications: Secondary | ICD-10-CM

## 2021-01-29 LAB — CMP14+EGFR
ALT: 18 IU/L (ref 0–32)
AST: 24 IU/L (ref 0–40)
Albumin/Globulin Ratio: 1.9 (ref 1.2–2.2)
Albumin: 4.1 g/dL (ref 3.8–4.8)
Alkaline Phosphatase: 70 IU/L (ref 44–121)
BUN/Creatinine Ratio: 18 (ref 12–28)
BUN: 15 mg/dL (ref 8–27)
Bilirubin Total: 0.2 mg/dL (ref 0.0–1.2)
CO2: 23 mmol/L (ref 20–29)
Calcium: 9.2 mg/dL (ref 8.7–10.3)
Chloride: 102 mmol/L (ref 96–106)
Creatinine, Ser: 0.83 mg/dL (ref 0.57–1.00)
Globulin, Total: 2.2 g/dL (ref 1.5–4.5)
Glucose: 125 mg/dL — ABNORMAL HIGH (ref 65–99)
Potassium: 4.9 mmol/L (ref 3.5–5.2)
Sodium: 141 mmol/L (ref 134–144)
Total Protein: 6.3 g/dL (ref 6.0–8.5)
eGFR: 76 mL/min/{1.73_m2} (ref >59)

## 2021-01-29 LAB — CBC WITH DIFFERENTIAL/PLATELET
Basophils Absolute: 0.1 10*3/uL (ref 0.0–0.2)
Basos: 1 %
EOS (ABSOLUTE): 0.1 10*3/uL (ref 0.0–0.4)
Eos: 1 %
Hematocrit: 38.3 % (ref 34.0–46.6)
Hemoglobin: 12.7 g/dL (ref 11.1–15.9)
Immature Grans (Abs): 0 10*3/uL (ref 0.0–0.1)
Immature Granulocytes: 0 %
Lymphocytes Absolute: 3.4 10*3/uL — ABNORMAL HIGH (ref 0.7–3.1)
Lymphs: 57 %
MCH: 32.2 pg (ref 26.6–33.0)
MCHC: 33.2 g/dL (ref 31.5–35.7)
MCV: 97 fL (ref 79–97)
Monocytes Absolute: 0.6 10*3/uL (ref 0.1–0.9)
Monocytes: 10 %
Neutrophils Absolute: 1.8 10*3/uL (ref 1.4–7.0)
Neutrophils: 31 %
Platelets: 240 10*3/uL (ref 150–450)
RBC: 3.95 x10E6/uL (ref 3.77–5.28)
RDW: 12.2 % (ref 11.7–15.4)
WBC: 6 10*3/uL (ref 3.4–10.8)

## 2021-01-29 LAB — LIPID PANEL
Chol/HDL Ratio: 2.3 ratio (ref 0.0–4.4)
Cholesterol, Total: 162 mg/dL (ref 100–199)
HDL: 70 mg/dL (ref >39)
LDL Chol Calc (NIH): 70 mg/dL (ref 0–99)
Triglycerides: 129 mg/dL (ref 0–149)
VLDL Cholesterol Cal: 22 mg/dL (ref 5–40)

## 2021-01-29 LAB — BAYER DCA HB A1C WAIVED: HB A1C (BAYER DCA - WAIVED): 7.2 % — ABNORMAL HIGH (ref <7.0)

## 2021-01-30 ENCOUNTER — Ambulatory Visit: Payer: PPO

## 2021-01-30 ENCOUNTER — Ambulatory Visit (INDEPENDENT_AMBULATORY_CARE_PROVIDER_SITE_OTHER): Payer: PPO | Admitting: Family Medicine

## 2021-01-30 ENCOUNTER — Encounter: Payer: Self-pay | Admitting: Family Medicine

## 2021-01-30 VITALS — BP 122/67 | HR 86 | Ht 62.0 in | Wt 112.0 lb

## 2021-01-30 DIAGNOSIS — K30 Functional dyspepsia: Secondary | ICD-10-CM | POA: Diagnosis not present

## 2021-01-30 DIAGNOSIS — E785 Hyperlipidemia, unspecified: Secondary | ICD-10-CM

## 2021-01-30 DIAGNOSIS — E1169 Type 2 diabetes mellitus with other specified complication: Secondary | ICD-10-CM | POA: Diagnosis not present

## 2021-01-30 MED ORDER — GLIPIZIDE ER 5 MG PO TB24: 5.0000 mg | ORAL_TABLET | Freq: Every day | ORAL | 3 refills | Status: DC

## 2021-01-30 MED ORDER — FAMOTIDINE 20 MG PO TABS: 20.0000 mg | ORAL_TABLET | Freq: Every day | ORAL | 1 refills | Status: DC

## 2021-01-30 MED ORDER — SIMVASTATIN 40 MG PO TABS: 40.0000 mg | ORAL_TABLET | Freq: Every day | ORAL | 3 refills | Status: DC

## 2021-01-30 MED ORDER — METFORMIN HCL ER 500 MG PO TB24: 1000.0000 mg | ORAL_TABLET | Freq: Every day | ORAL | 3 refills | Status: DC

## 2021-01-30 NOTE — Progress Notes (Signed)
BP 122/67   Pulse 86   Ht 5' 2"  (1.575 m)   Wt 112 lb (50.8 kg)   SpO2 96%   BMI 20.49 kg/m    Subjective:   Patient ID: Misty [name], female    DOB: 1951/10/30, 70 y.o.   MRN: [id number]  HPI: Misty [name] is a 70 y.o. female presenting on 01/30/2021 for Medical Management of Chronic Issues, Diabetes, Hyperlipidemia, and Weight Loss (10 pounds in 66mper pt. Has loss of appetite )   HPI Type 2 diabetes mellitus Patient comes in today for recheck of his diabetes. Patient has been currently taking Metformin and glipizide. Patient is not currently on an ACE inhibitor/ARB. Patient has not seen an ophthalmologist this year. Patient denies any issues with their feet. The symptom started onset as an adult hyperlipidemia ARE RELATED TO DM   Hyperlipidemia Patient is coming in for recheck of his hyperlipidemia. The patient is currently taking simvastatin. They deny any issues with myalgias or history of liver damage from it. They deny any focal numbness or weakness or chest pain.   Patient comes in complaining of some early satiety and some nausea that is been going on over the past couple months, she says it is more in the morning and then gets better after she has been up for couple hours but because of that she does not feel like eating breakfast.  She denies any vomiting or constipation or blood in her stool.  She denies any abdominal pain.  She denies any belching or burping.  Relevant past medical, surgical, family and social history reviewed and updated as indicated. Interim medical history since our last visit reviewed. Allergies and medications reviewed and updated.  Review of Systems  Constitutional: Negative for chills and fever.  Eyes: Negative for visual disturbance.  Respiratory: Negative for chest tightness and shortness of breath.   Cardiovascular: Negative for chest pain and leg swelling.  Gastrointestinal: Positive for nausea. Negative for abdominal pain,  constipation, diarrhea, rectal pain and vomiting.  Genitourinary: Negative for difficulty urinating and dysuria.  Musculoskeletal: Negative for back pain and gait problem.  Skin: Negative for rash.  Neurological: Negative for light-headedness and headaches.  Psychiatric/Behavioral: Negative for agitation and behavioral problems.  All other systems reviewed and are negative.   Per HPI unless specifically indicated above   Allergies as of 01/30/2021      Reactions   Atorvastatin Other (See Comments)   MUSCLE PAIN       Medication List       Accurate as of January 30, 2021  9:13 AM. If you have any questions, ask your nurse or doctor.        benzonatate 100 MG capsule Commonly known as: Tessalon Perles Take 1 capsule (100 mg total) by mouth 3 (three) times daily as needed for cough.   blood glucose meter kit and supplies Kit Dispense based on patient and insurance preference. Use up to four times daily as directed. (FOR ICD-9 250.00, 250.01).   famotidine 20 MG tablet Commonly known as: Pepcid Take 1 tablet (20 mg total) by mouth at bedtime. Started by: JFransisca KaufmannDettinger, MD   glipiZIDE 5 MG 24 hr tablet Commonly known as: GLUCOTROL XL Take 1 tablet (5 mg total) by mouth daily with breakfast. What changed: See the new instructions. Changed by: JFransisca KaufmannDettinger, MD   metFORMIN 500 MG 24 hr tablet Commonly known as: GLUCOPHAGE-XR Take 2 tablets (1,000 mg total) by mouth at bedtime.  OneTouch Ultra test strip Generic drug: glucose blood USE 4 TIMES DAILY AS DRECTED DX: E11.9   onetouch ultrasoft lancets USE TO TEST BLOOD SUGAR 4 TIMES A DAY AS DIRECTED   simvastatin 40 MG tablet Commonly known as: ZOCOR Take 1 tablet (40 mg total) by mouth daily.   VISION FORMULA 2 PO Take 1 capsule by mouth at bedtime.        Objective:   BP 122/67   Pulse 86   Ht 5' 2"  (1.575 m)   Wt 112 lb (50.8 kg)   SpO2 96%   BMI 20.49 kg/m   Wt Readings from Last 3  Encounters:  01/30/21 112 lb (50.8 kg)  09/25/20 116 lb (52.6 kg)  05/08/20 120 lb (54.4 kg)    Physical Exam Vitals and nursing note reviewed.  Constitutional:      General: She is not in acute distress.    Appearance: She is well-developed. She is not diaphoretic.  Eyes:     Conjunctiva/sclera: Conjunctivae normal.  Cardiovascular:     Rate and Rhythm: Normal rate and regular rhythm.     Heart sounds: Normal heart sounds. No murmur heard.   Pulmonary:     Effort: Pulmonary effort is normal. No respiratory distress.     Breath sounds: Normal breath sounds. No wheezing.  Abdominal:     General: Abdomen is flat. Bowel sounds are normal. There is no distension.     Tenderness: There is no abdominal tenderness. There is no right CVA tenderness, left CVA tenderness, guarding or rebound.  Musculoskeletal:        General: No tenderness. Normal range of motion.  Skin:    General: Skin is warm and dry.     Findings: No rash.  Neurological:     Mental Status: She is alert and oriented to person, place, and time.     Coordination: Coordination normal.  Psychiatric:        Behavior: Behavior normal.     Results for orders placed or performed in visit on 01/29/21  CBC with Differential/Platelet  Result Value Ref Range   WBC 6.0 3.4 - 10.8 x10E3/uL   RBC 3.95 3.77 - 5.28 x10E6/uL   Hemoglobin 12.7 11.1 - 15.9 g/dL   Hematocrit 38.3 34.0 - 46.6 %   MCV 97 79 - 97 fL   MCH 32.2 26.6 - 33.0 pg   MCHC 33.2 31.5 - 35.7 g/dL   RDW 12.2 11.7 - 15.4 %   Platelets 240 150 - 450 x10E3/uL   Neutrophils 31 Not Estab. %   Lymphs 57 Not Estab. %   Monocytes 10 Not Estab. %   Eos 1 Not Estab. %   Basos 1 Not Estab. %   Neutrophils Absolute 1.8 1.4 - 7.0 x10E3/uL   Lymphocytes Absolute 3.4 (H) 0.7 - 3.1 x10E3/uL   Monocytes Absolute 0.6 0.1 - 0.9 x10E3/uL   EOS (ABSOLUTE) 0.1 0.0 - 0.4 x10E3/uL   Basophils Absolute 0.1 0.0 - 0.2 x10E3/uL   Immature Granulocytes 0 Not Estab. %   Immature  Grans (Abs) 0.0 0.0 - 0.1 x10E3/uL  CMP14+EGFR  Result Value Ref Range   Glucose 125 (H) 65 - 99 mg/dL   BUN 15 8 - 27 mg/dL   Creatinine, Ser 0.83 0.57 - 1.00 mg/dL   eGFR 76 >59 mL/min/1.73   BUN/Creatinine Ratio 18 12 - 28   Sodium 141 134 - 144 mmol/L   Potassium 4.9 3.5 - 5.2 mmol/L   Chloride 102 96 -  106 mmol/L   CO2 23 20 - 29 mmol/L   Calcium 9.2 8.7 - 10.3 mg/dL   Total Protein 6.3 6.0 - 8.5 g/dL   Albumin 4.1 3.8 - 4.8 g/dL   Globulin, Total 2.2 1.5 - 4.5 g/dL   Albumin/Globulin Ratio 1.9 1.2 - 2.2   Bilirubin Total 0.2 0.0 - 1.2 mg/dL   Alkaline Phosphatase 70 44 - 121 IU/L   AST 24 0 - 40 IU/L   ALT 18 0 - 32 IU/L  Lipid panel  Result Value Ref Range   Cholesterol, Total 162 100 - 199 mg/dL   Triglycerides 129 0 - 149 mg/dL   HDL 70 >39 mg/dL   VLDL Cholesterol Cal 22 5 - 40 mg/dL   LDL Chol Calc (NIH) 70 0 - 99 mg/dL   Chol/HDL Ratio 2.3 0.0 - 4.4 ratio  Bayer DCA Hb A1c Waived  Result Value Ref Range   HB A1C (BAYER DCA - WAIVED) 7.2 (H) <7.0 %    Assessment & Plan:   Problem List Items Addressed This Visit      Endocrine   Type 2 diabetes mellitus (HCC) - Primary   Relevant Medications   glipiZIDE (GLUCOTROL XL) 5 MG 24 hr tablet   metFORMIN (GLUCOPHAGE-XR) 500 MG 24 hr tablet   simvastatin (ZOCOR) 40 MG tablet   Other Relevant Orders   Bayer DCA Hb A1c Waived   Hyperlipidemia associated with type 2 diabetes mellitus (HCC)   Relevant Medications   glipiZIDE (GLUCOTROL XL) 5 MG 24 hr tablet   metFORMIN (GLUCOPHAGE-XR) 500 MG 24 hr tablet   simvastatin (ZOCOR) 40 MG tablet    Other Visit Diagnoses    Acid indigestion          Patient has been getting some early morning congestion not feeling like eating in the morning and has been losing weight, lost about 10 pounds in 6 months.  Will try acid reflux medicine in the evening with dinner  No other change medication, A1c 7.2 so right on the border and will continue to monitor. Follow up  plan: Return in about 3 months (around 05/02/2021), or if symptoms worsen or fail to improve, for Diabetes recheck.  Counseling provided for all of the vaccine components Orders Placed This Encounter  Procedures  . [facility] Hb A1c [place], MD [facility] 01/30/2021, 9:13 AM

## 2021-02-07 ENCOUNTER — Encounter: Payer: Self-pay | Admitting: Family Medicine

## 2021-02-07 ENCOUNTER — Other Ambulatory Visit: Payer: Self-pay

## 2021-02-07 ENCOUNTER — Ambulatory Visit (INDEPENDENT_AMBULATORY_CARE_PROVIDER_SITE_OTHER): Payer: PPO | Admitting: Family Medicine

## 2021-02-07 VITALS — BP 151/82 | HR 78 | Ht 62.0 in | Wt 113.0 lb

## 2021-02-07 DIAGNOSIS — R413 Other amnesia: Secondary | ICD-10-CM | POA: Diagnosis not present

## 2021-02-07 DIAGNOSIS — E1169 Type 2 diabetes mellitus with other specified complication: Secondary | ICD-10-CM

## 2021-02-07 DIAGNOSIS — E785 Hyperlipidemia, unspecified: Secondary | ICD-10-CM | POA: Diagnosis not present

## 2021-02-07 MED ORDER — DONEPEZIL HCL 5 MG PO TBDP
5.0000 mg | ORAL_TABLET | Freq: Every day | ORAL | 2 refills | Status: DC
Start: 2021-02-07 — End: 2021-05-06

## 2021-02-07 NOTE — Progress Notes (Signed)
BP (!) 151/82   Pulse 78   Ht 5' 2"  (1.575 m)   Wt 113 lb (51.3 kg)   SpO2 96%   BMI 20.67 kg/m    Subjective:   Patient ID: Misty [name], female    DOB: 1951-03-09, 70 y.o.   MRN: [id number]  HPI: Misty [name] is a 70 y.o. female presenting on 02/07/2021 for No chief complaint on file.   HPI Patient is coming in today with complaints of memory issues.  She brings her spouse in here today and he has been more concerned that she is forgetting simple stuff and having a lot more often.  She will forget that she had just done something or just gone somewhere.  He says is been having a lot more frequently over the past few months and her sister did have a lot of really bad dementia.  Type 2 diabetes mellitus Patient comes in today for recheck of his diabetes. Patient has been currently taking Metformin and glipizide. Patient is not currently on an ACE inhibitor/ARB. Patient has not seen an ophthalmologist this year. Patient denies any issues with their feet. The symptom started onset as an adult hyperlipidemia ARE RELATED TO DM.  Patient is having some low blood sugars that have been happening more often.  She cannot recall how low they are going.  Hyperlipidemia Patient is coming in for recheck of his hyperlipidemia. The patient is currently taking simvastatin. They deny any issues with myalgias or history of liver damage from it. They deny any focal numbness or weakness or chest pain.   Relevant past medical, surgical, family and social history reviewed and updated as indicated. Interim medical history since our last visit reviewed. Allergies and medications reviewed and updated.  Review of Systems  Constitutional: Negative for chills and fever.  Eyes: Negative for visual disturbance.  Respiratory: Negative for chest tightness and shortness of breath.   Cardiovascular: Negative for chest pain and leg swelling.  Musculoskeletal: Negative for back pain and gait problem.   Skin: Negative for rash.  Neurological: Negative for light-headedness and headaches.  Psychiatric/Behavioral: Negative for agitation and behavioral problems.  All other systems reviewed and are negative.   Per HPI unless specifically indicated above   Allergies as of 02/07/2021      Reactions   Atorvastatin Other (See Comments)   MUSCLE PAIN       Medication List       Accurate as of February 07, 2021 11:31 AM. If you have any questions, ask your nurse or doctor.        benzonatate 100 MG capsule Commonly known as: Tessalon Perles Take 1 capsule (100 mg total) by mouth 3 (three) times daily as needed for cough.   blood glucose meter kit and supplies Kit Dispense based on patient and insurance preference. Use up to four times daily as directed. (FOR ICD-9 250.00, 250.01).   famotidine 20 MG tablet Commonly known as: Pepcid Take 1 tablet (20 mg total) by mouth at bedtime.   glipiZIDE 5 MG 24 hr tablet Commonly known as: GLUCOTROL XL Take 1 tablet (5 mg total) by mouth daily with breakfast.   metFORMIN 500 MG 24 hr tablet Commonly known as: GLUCOPHAGE-XR Take 2 tablets (1,000 mg total) by mouth at bedtime.   OneTouch Ultra test strip Generic drug: glucose blood USE 4 TIMES DAILY AS DRECTED DX: E11.9   onetouch ultrasoft lancets USE TO TEST BLOOD SUGAR 4 TIMES A DAY AS DIRECTED  simvastatin 40 MG tablet Commonly known as: ZOCOR Take 1 tablet (40 mg total) by mouth daily.   VISION FORMULA 2 PO Take 1 capsule by mouth at bedtime.        Objective:   BP (!) 151/82   Pulse 78   Ht 5' 2"  (1.575 m)   Wt 113 lb (51.3 kg)   SpO2 96%   BMI 20.67 kg/m   Wt Readings from Last 3 Encounters:  02/07/21 113 lb (51.3 kg)  01/30/21 112 lb (50.8 kg)  09/25/20 116 lb (52.6 kg)    Physical Exam Vitals and nursing note reviewed.  Constitutional:      General: She is not in acute distress.    Appearance: She is well-developed. She is not diaphoretic.  Eyes:      Conjunctiva/sclera: Conjunctivae normal.  Cardiovascular:     Rate and Rhythm: Normal rate and regular rhythm.     Heart sounds: Normal heart sounds. No murmur heard.   Pulmonary:     Effort: Pulmonary effort is normal. No respiratory distress.     Breath sounds: Normal breath sounds. No wheezing.  Musculoskeletal:        General: No tenderness. Normal range of motion.  Skin:    General: Skin is warm and dry.     Findings: No rash.  Neurological:     Mental Status: She is alert and oriented to person, place, and time.     Coordination: Coordination normal.  Psychiatric:        Behavior: Behavior normal.       Assessment & Plan:   Problem List Items Addressed This Visit      Endocrine   Type 2 diabetes mellitus ([place])   Relevant Orders   Bayer DCA Hb A1c Waived   BMP8+EGFR   Hyperlipidemia associated with type 2 diabetes mellitus ([place])   Relevant Orders   BMP8+EGFR    Other Visit Diagnoses    Memory difficulties    -  Primary   Relevant Medications   donepezil (ARICEPT ODT) 5 MG disintegrating tablet   Other Relevant Orders   Vitamin B12   TSH      Patient getting some hypoglycemic episodes along with memory issues, will get rid of the glipizide, her sugars may run up we may have to start another medicine for it but will try and see where she has had over the next few months.  Patient getting some early memory issues although does not test done on the mental and is full we will check this point, she scored 29 out of 30.  She does want to try a memory medication, will try Aricept. Follow up plan: Return in about 3 months (around 05/09/2021), or if symptoms worsen or fail to improve, for Memory recheck and diabetes.  Counseling provided for all of the vaccine components No orders of the defined types were placed in this encounter.   Caryl Pina, MD [facility] 02/07/2021, 11:31 AM

## 2021-02-13 ENCOUNTER — Ambulatory Visit (INDEPENDENT_AMBULATORY_CARE_PROVIDER_SITE_OTHER): Payer: PPO

## 2021-02-13 VITALS — Ht 62.0 in | Wt 113.0 lb

## 2021-02-13 DIAGNOSIS — Z0000 Encounter for general adult medical examination without abnormal findings: Secondary | ICD-10-CM | POA: Diagnosis not present

## 2021-02-13 DIAGNOSIS — Z1231 Encounter for screening mammogram for malignant neoplasm of breast: Secondary | ICD-10-CM | POA: Diagnosis not present

## 2021-02-13 NOTE — Patient Instructions (Addendum)
Misty [name] , Thank you for taking time to come for your Medicare Wellness Visit. I appreciate your ongoing commitment to your health goals. Please review the following plan we discussed and let me know if I can assist you in the future.   Screening recommendations/referrals: Colonoscopy: Done 08/06/2017 - Repeat in 07/2022 (5 years) Mammogram: Done 04/12/2020 - Repeat in one year (order placed today) Bone Density: Done 04/07/2017 - Repeat in 2 years (order placed today) Recommended yearly ophthalmology/optometry visit for glaucoma screening and checkup Recommended yearly dental visit for hygiene and checkup  Vaccinations: Influenza vaccine: Done 09/25/2020 -Repeat annually Pneumococcal vaccine: Done 10/05/2016 & 01/27/2018 - No repeat Tdap vaccine: Due Shingles vaccine: Shingrix discussed. Please contact your pharmacy for coverage information.    Covid-19: Done 11/18/19, 12/09/19 & 09/03/2020  Advanced directives: Please bring a copy of your health care power of attorney and living will to the office to be added to your chart at your convenience.  Conditions/risks identified: Continue working on healthy diabetic diet and exercise regimen. At the end of this summary I included some tips I found that may help you maintain your weight and still keep your A1C under control.  Next appointment: Follow up in one year for your annual wellness visit    Preventive Care 65 Years and Older, Female Preventive care refers to lifestyle choices and visits with your health care provider that can promote health and wellness. What does preventive care include?  A yearly physical exam. This is also called an annual well check.  Dental exams once or twice a year.  Routine eye exams. Ask your health care provider how often you should have your eyes checked.  Personal lifestyle choices, including:  Daily care of your teeth and gums.  Regular physical activity.  Eating a healthy diet.  Avoiding tobacco and  drug use.  Limiting alcohol use.  Practicing safe sex.  Taking low-dose aspirin every day.  Taking vitamin and mineral supplements as recommended by your health care provider. What happens during an annual well check? The services and screenings done by your health care provider during your annual well check will depend on your age, overall health, lifestyle risk factors, and family history of disease. Counseling  Your health care provider may ask you questions about your:  Alcohol use.  Tobacco use.  Drug use.  Emotional well-being.  Home and relationship well-being.  Sexual activity.  Eating habits.  History of falls.  Memory and ability to understand (cognition).  Work and work Statistician.  Reproductive health. Screening  You may have the following tests or measurements:  Height, weight, and BMI.  Blood pressure.  Lipid and cholesterol levels. These may be checked every 5 years, or more frequently if you are over 66 years old.  Skin check.  Lung cancer screening. You may have this screening every year starting at age 78 if you have a 30-pack-year history of smoking and currently smoke or have quit within the past 15 years.  Fecal occult blood test (FOBT) of the stool. You may have this test every year starting at age 75.  Flexible sigmoidoscopy or colonoscopy. You may have a sigmoidoscopy every 5 years or a colonoscopy every 10 years starting at age 63.  Hepatitis C blood test.  Hepatitis B blood test.  Sexually transmitted disease (STD) testing.  Diabetes screening. This is done by checking your blood sugar (glucose) after you have not eaten for a while (fasting). You may have this done every 1-3 years.  Bone density scan. This is done to screen for osteoporosis. You may have this done starting at age 32.  Mammogram. This may be done every 1-2 years. Talk to your health care provider about how often you should have regular mammograms. Talk with your  health care provider about your test results, treatment options, and if necessary, the need for more tests. Vaccines  Your health care provider may recommend certain vaccines, such as:  Influenza vaccine. This is recommended every year.  Tetanus, diphtheria, and acellular pertussis (Tdap, Td) vaccine. You may need a Td booster every 10 years.  Zoster vaccine. You may need this after age 101.  Pneumococcal 13-valent conjugate (PCV13) vaccine. One dose is recommended after age 31.  Pneumococcal polysaccharide (PPSV23) vaccine. One dose is recommended after age 73. Talk to your health care provider about which screenings and vaccines you need and how often you need them. This information is not intended to replace advice given to you by your health care provider. Make sure you discuss any questions you have with your health care provider. Document Released: 11/22/2015 Document Revised: 07/15/2016 Document Reviewed: 08/27/2015 Elsevier Interactive Patient Education  2017 [place] Prevention in the Home Falls can cause injuries. They can happen to people of all ages. There are many things you can do to make your home safe and to help prevent falls. What can I do on the outside of my home?  Regularly fix the edges of walkways and driveways and fix any cracks.  Remove anything that might make you trip as you walk through a door, such as a raised step or threshold.  Trim any bushes or trees on the path to your home.  Use bright outdoor lighting.  Clear any walking paths of anything that might make someone trip, such as rocks or tools.  Regularly check to see if handrails are loose or broken. Make sure that both sides of any steps have handrails.  Any raised decks and porches should have guardrails on the edges.  Have any leaves, snow, or ice cleared regularly.  Use sand or salt on walking paths during winter.  Clean up any spills in your garage right away. This includes oil  or grease spills. What can I do in the bathroom?  Use night lights.  Install grab bars by the toilet and in the tub and shower. Do not use towel bars as grab bars.  Use non-skid mats or decals in the tub or shower.  If you need to sit down in the shower, use a plastic, non-slip stool.  Keep the floor dry. Clean up any water that spills on the floor as soon as it happens.  Remove soap buildup in the tub or shower regularly.  Attach bath mats securely with double-sided non-slip rug tape.  Do not have throw rugs and other things on the floor that can make you trip. What can I do in the bedroom?  Use night lights.  Make sure that you have a light by your bed that is easy to reach.  Do not use any sheets or blankets that are too big for your bed. They should not hang down onto the floor.  Have a firm chair that has side arms. You can use this for support while you get dressed.  Do not have throw rugs and other things on the floor that can make you trip. What can I do in the kitchen?  Clean up any spills right away.  Avoid walking  on wet floors.  Keep items that you use a lot in easy-to-reach places.  If you need to reach something above you, use a strong step stool that has a grab bar.  Keep electrical cords out of the way.  Do not use floor polish or wax that makes floors slippery. If you must use wax, use non-skid floor wax.  Do not have throw rugs and other things on the floor that can make you trip. What can I do with my stairs?  Do not leave any items on the stairs.  Make sure that there are handrails on both sides of the stairs and use them. Fix handrails that are broken or loose. Make sure that handrails are as long as the stairways.  Check any carpeting to make sure that it is firmly attached to the stairs. Fix any carpet that is loose or worn.  Avoid having throw rugs at the top or bottom of the stairs. If you do have throw rugs, attach them to the floor with  carpet tape.  Make sure that you have a light switch at the top of the stairs and the bottom of the stairs. If you do not have them, ask someone to add them for you. What else can I do to help prevent falls?  Wear shoes that:  Do not have high heels.  Have rubber bottoms.  Are comfortable and fit you well.  Are closed at the toe. Do not wear sandals.  If you use a stepladder:  Make sure that it is fully opened. Do not climb a closed stepladder.  Make sure that both sides of the stepladder are locked into place.  Ask someone to hold it for you, if possible.  Clearly mark and make sure that you can see:  Any grab bars or handrails.  First and last steps.  Where the edge of each step is.  Use tools that help you move around (mobility aids) if they are needed. These include:  Canes.  Walkers.  Scooters.  Crutches.  Turn on the lights when you go into a dark area. Replace any light bulbs as soon as they burn out.  Set up your furniture so you have a clear path. Avoid moving your furniture around.  If any of your floors are uneven, fix them.  If there are any pets around you, be aware of where they are.  Review your medicines with your doctor. Some medicines can make you feel dizzy. This can increase your chance of falling. Ask your doctor what other things that you can do to help prevent falls. This information is not intended to replace advice given to you by your health care provider. Make sure you discuss any questions you have with your health care provider. Document Released: 08/22/2009 Document Revised: 04/02/2016 Document Reviewed: 11/30/2014 Elsevier Interactive Patient Education  2017 [employer].    What you can do Diabetes food plans are often geared toward helping people lose, rather than gain, weight. This can make it harder to figure out how to gain weight in a healthy way. Before trying the tips below, talk with your doctor or dietician. They can  help you set the right diet and exercise goals for you, as well as answer any questions you may have. 1. Start with an app There are many apps available to help you manage your condition and make the right food choices. Look for apps that help you track blood sugar and BMI. Some options include: GlucOracle:  This glucose forecasting app uses crowdsourcing to analyze the estimated amount of carbohydrates, protein, fat, calories, and fiber in each meal. It also predicts what your glucose level will be after eating. SuperTracker: This app helps you gain weight by providing comprehensive nutritional information on over 8,000 food items. It also tracks your nutritional targets, diet, and activity levels against your goals. 2. Determine your ideal weight It's important to know what your current weight is, as well as establish how much weight you want to gain overall. Setting weekly gain goals can help you chart your progress. You should also know what the appropriate BMI is, for your frame and height. Plugging your height and weight into a BMI calculator can help you get an idea of where your weight should be. Your doctor or dietitian can provide you with more specific information about your ideal weight. They can also help you determine what your daily caloric intake should be.   3. Eat six small meals a day instead of three bigger meals The only way to gain weight is to increase your calorie consumption. The trick is to eat healthy food every three hours or so, before your body starts burning its fat stores for energy. Getting used to eating this way takes a bit of practice, as well as planning. It doesn't mean giving up dinner with the family or not meeting friends for lunch. But it does mean keeping an eye on what you eat, so your intake is as nutrient- and calorie-dense as possible. Planning out your meals for the week can help. Your meals should be made up of: . lean protein . mono and polyunsaturated  fats . whole grains . fruits . vegetables Try to drink fluids an hour or more before your meals, or shortly after you finish eating, rather than during meals. This will stop you from filling up on fluids. Sample meal plan . Breakfast: scrambled eggs with [place] bacon and whole grain toast, drizzled with olive oil . Snack: cheddar cheese, almonds, and an apple . Lunch: [place] sandwich on whole grain bread, plus an avocado salad, topped with seeds and low-sugar dressing . Snack: low-sugar [place] yogurt with walnuts and dried cranberries . Dinner: grilled salmon with quinoa, and broccoli with cheese sauce . Snack: all-natural peanut butter spread on whole grain crackers 4. Get more healthful carbs throughout the day Eating carbohydrates low on the glycemic index is important for maintaining stable blood sugar levels. Folding healthy carbs into your 'six meals per day' plan may help you gain weight, but it's important to keep an eye on your glucose levels. Adding a protein or fat each time you eat a carb may help increase caloric consumption, without causing your sugar levels to spike. Examples of healthy carbs include: . whole grains . vegetables . berries . nuts . legumes . seeds 5. Eat foods high in healthful mono and polyunsaturated fats Opt for heart healthy fats -- such as sunflower seed oil -- whenever you can. Adding a portion of healthy fat to each of your meals can help you gain weight without tacking on empty calories. Some options include: . avocado . olive oil . canola oil . nuts . seeds . fatty fish, such as salmon and mackerel 6. Get more dietary protein Protein is necessary for maintaining muscle mass. Good sources include: . fish . chicken . beans . soy . eggs Talk to your doctor about the appropriate serving size of protein for you, based on your kidney function and  weight gain goal. For example, if you currently eat three to four ounces of protein per day, you may  need to kick it up to seven ounces. 7. Avoid low-calorie foods and beverages In order to gain weight, you have to eat at least 500 additional calories per day. Opting for calorie-dense foods will help you meet that goal more easily. But if you just can't pass on low-cal faves like celery and lettuce, here are a few ways to boost their calorie count. If you love the crunch of celery, try putting it in chicken salad. You can also fill a stalk with cream cheese or almond butter, instead of eating it plain. Can't give up lettuce? You don't have to. Just sprinkle on some cheese, seeds, and avocado slices, or enjoy blue cheese dressing on top. 8. Avoid low-fat foods and beverages You can spice up low-cal foods, but low-fat or no-fat foods are always a hard no. Processed foods often swap fat for sugar, which is lacking in nutritional value. Common culprits include low-fat cookies, crackers, yogurt, and frozen entrees. 9. Supplement wisely Supplements may help with weight gain, especially if you lack the appetite to take in enough calories. Look for supplements designed to help build muscle mass, such as casein or whey protein powder. Check with your doctor before starting any supplement and always follow the directions on the label. 10. Amp up your workout with resistance training Resistance training with weights and machines can help add lean muscle, as well as increase your appetite. You can also try aquatic resistance training or work with medicine balls. Upping your workout to include weights doesn't mean you have to forgo aerobic activity, though. Just be aware that aerobics burns more calories, and be sure to compensate with your diet. 61. Track your progress with a weekly weigh-in The only way to know you're gaining weight is to weigh yourself. A weekly weigh-in can track your progress and help you modify your current eating routine, as needed. If you're taking in enough calories, you should start  to see an increase of about one pound in one week's time. Target a one- to two-pound weekly increase until you reach your goal weight. The bottom line If you have diabetes, gaining weight can be challenging. You'll have to increase your caloric consumption by at least 500 calories per day, if not more. Talk to your doctor or dietician about how you can best achieve this. They can help you set weight goals, create a meal plan, and modify your exercise routine to set you up for success.

## 2021-02-13 NOTE — Progress Notes (Signed)
Subjective:   Misty [name] is a 70 y.o. female who presents for Medicare Annual (Subsequent) preventive examination.  Virtual Visit via Telephone Note  I connected with  Latoyna Hird Oliva on 02/13/21 at  9:45 AM EDT by telephone and verified that I am speaking with the correct person using two identifiers.  Location: Patient: Home Provider: [facility] Persons participating in the virtual visit: patient/Nurse Health Advisor   I discussed the limitations, risks, security and privacy concerns of performing an evaluation and management service by telephone and the availability of in person appointments. The patient expressed understanding and agreed to proceed.  Interactive audio and video telecommunications were attempted between this nurse and patient, however failed, due to patient having technical difficulties OR patient did not have access to video capability.  We continued and completed visit with audio only.  Some vital signs may be absent or patient reported.   Primrose Oler E Deyona Soza, LPN   Review of Systems     Cardiac Risk Factors include: advanced age (>6mn, >>43women);diabetes mellitus;dyslipidemia;hypertension     Objective:    Today's Vitals   02/13/21 1001  Weight: 113 lb (51.3 kg)  Height: 5' 2"  (1.575 m)   Body mass index is 20.67 kg/m.  Advanced Directives 02/13/2021 02/13/2020 12/01/2018 11/30/2017 08/06/2017  Does Patient Have a Medical Advance Directive? Yes Yes Yes Yes Yes  Type of [place]of [place] Does patient want to make changes to medical advance directive? - No - Patient declined No - Patient declined No - Patient declined -  Copy of HRetreatin Chart? No - copy requested - No - copy requested No - copy requested No - copy requested    Current  Medications (verified) Outpatient Encounter Medications as of 02/13/2021  Medication Sig  . blood glucose meter kit and supplies KIT Dispense based on patient and insurance preference. Use up to four times daily as directed. (FOR ICD-9 250.00, 250.01).  . Lancets (ONETOUCH ULTRASOFT) lancets USE TO TEST BLOOD SUGAR 4 TIMES A DAY AS DIRECTED  . metFORMIN (GLUCOPHAGE-XR) 500 MG 24 hr tablet Take 2 tablets (1,000 mg total) by mouth at bedtime.  . Multiple Vitamins-Minerals (VISION FORMULA 2 PO) Take 1 capsule by mouth at bedtime.  .Glory RosebushULTRA test strip USE 4 TIMES DAILY AS DRECTED DX: E11.9  . simvastatin (ZOCOR) 40 MG tablet Take 1 tablet (40 mg total) by mouth daily.  . benzonatate (TESSALON PERLES) 100 MG capsule Take 1 capsule (100 mg total) by mouth 3 (three) times daily as needed for cough. (Patient not taking: Reported on 02/13/2021)  . donepezil (ARICEPT ODT) 5 MG disintegrating tablet Take 1 tablet (5 mg total) by mouth at bedtime. (Patient not taking: Reported on 02/13/2021)  . famotidine (PEPCID) 20 MG tablet Take 1 tablet (20 mg total) by mouth at bedtime. (Patient not taking: Reported on 02/13/2021)   No facility-administered encounter medications on file as of 02/13/2021.    Allergies (verified) Atorvastatin   History: Past Medical History:  Diagnosis Date  . Colon polyps   . Diabetes mellitus without complication [place]   . Fractured sternum   . Hyperlipidemia   . Seasonal allergies   . Whiplash    Past Surgical History:  Procedure Laterality Date  . CESAREAN SECTION    . COLONOSCOPY    . COLONOSCOPY N/A 08/06/2017  Procedure: COLONOSCOPY;  Surgeon: Danie Binder, MD;  Location[facility];  Service: Endoscopy;  Laterality: N/A;  9:30 Am   Family History  Problem Relation Age of Onset  . Cancer Mother        INTESTINAL  . Colon cancer Mother   . Stroke Father   . Cancer Sister        BREAST  . Heart disease Brother   . Heart disease Son    Social History    Socioeconomic History  . Marital status: Married    Spouse name: Not on file  . Number of children: 3  . Years of education: Not on file  . Highest education level: Some college, no degree  Occupational History  . Occupation: Presenter, broadcasting    Comment: [employer] works at retirement community  Tobacco Use  . Smoking status: Never Smoker  . Smokeless tobacco: Never Used  Vaping Use  . Vaping Use: Never used  Substance and Sexual Activity  . Alcohol use: No  . Drug use: No  . Sexual activity: Not on file  Other Topics Concern  . Not on file  Social History Narrative   She still works full time; lives with husband 02/13/21   Social Determinants of Health   Financial Resource Strain: Low Risk   . Difficulty of Paying Living Expenses: Not hard at all  Food Insecurity: No Food Insecurity  . Worried About Charity fundraiser in the Last Year: Never true  . Ran Out of Food in the Last Year: Never true  Transportation Needs: No Transportation Needs  . Lack of Transportation (Medical): No  . Lack of Transportation (Non-Medical): No  Physical Activity: Sufficiently Active  . Days of Exercise per Week: 5 days  . Minutes of Exercise per Session: 30 min  Stress: No Stress Concern Present  . Feeling of Stress : Not at all  Social Connections: Moderately Isolated  . Frequency of Communication with Friends and Family: More than three times a week  . Frequency of Social Gatherings with Friends and Family: Three times a week  . Attends Religious Services: Never  . Active Member of Clubs or Organizations: No  . Attends Archivist Meetings: Never  . Marital Status: Married    Tobacco Counseling Counseling given: Not Answered   Clinical Intake:  Pre-visit preparation completed: Yes  Pain : No/denies pain     BMI - recorded: 20.67 Nutritional Status: BMI of 19-24  Normal Nutritional Risks: Unintentional weight loss (due to metformin - new meds given are helping) Diabetes:  Yes CBG done?: No Did pt. bring in CBG monitor from home?: No  How often do you need to have someone help you when you read instructions, pamphlets, or other written materials from your doctor or pharmacy?: 1 - Never  Nutrition Risk Assessment:  Has the patient had any N/V/D within the last 2 months?  Yes  Does the patient have any non-healing wounds?  No  Has the patient had any unintentional weight loss or weight gain?  Yes   Diabetes:  Is the patient diabetic?  Yes  If diabetic, was a CBG obtained today?  No  Did the patient bring in their glucometer from home?  No  How often do you monitor your CBG's? Twice per day.   Financial Strains and Diabetes Management:  Are you having any financial strains with the device, your supplies or your medication? No .  Does the patient want to be seen  by Chronic Care Management for management of their diabetes?  No  Would the patient like to be referred to a Nutritionist or for Diabetic Management?  No   Diabetic Exams:  Diabetic Eye Exam: Completed 09/18/2020.  Diabetic Foot Exam: Completed 09/25/2020. Pt has been advised about the importance in completing this exam. Pt is scheduled for diabetic foot exam on 05/07/2021.    Interpreter Needed?: No  Information entered by :: Shelene Krage, LPN   Activities of Daily Living In your present state of health, do you have any difficulty performing the following activities: 02/13/2021  Hearing? N  Vision? N  Difficulty concentrating or making decisions? Y  Walking or climbing stairs? N  Dressing or bathing? N  Doing errands, shopping? N  Preparing Food and eating ? N  Using the Toilet? N  In the past six months, have you accidently leaked urine? N  Do you have problems with loss of bowel control? N  Managing your Medications? N  Managing your Finances? N  Housekeeping or managing your Housekeeping? N  Some recent data might be hidden    Patient Care Team: Dettinger, Fransisca Kaufmann, MD as PCP  - General (Family Medicine) Shea Evans, [name], LCSW as [place] Management (Licensed Clinical Social Worker)  Indicate any recent [place] you may have received from other than [facility] providers in the past year (date may be approximate).     Assessment:   This is a routine wellness examination for Kristl.  Hearing/Vision screen  Hearing Screening   125Hz  250Hz  500Hz  1000Hz  2000Hz  3000Hz  4000Hz  6000Hz  8000Hz   Right ear:           Left ear:           Comments: No complaints of hearing loss  Vision Screening Comments: Annual visit with Dr Marin Comment in [place] - recently had eyes checked  Dietary issues and exercise activities discussed: Current Exercise Habits: Home exercise routine, Type of exercise: walking, Time (Minutes): 30, Frequency (Times/Week): 5, Weekly Exercise (Minutes/Week): 150, Intensity: Mild, Exercise limited by: None identified  Goals    .  Client will talk with LCSW and RNCM about challenges in managing client diabetes (pt-stated)      CARE PLAN ENTRY   Current Barriers:   Challenges in managing DM in patient with Chronic Diagnoses of Type 2 DM, HLD, Adenoma of Ascending Colon  Clinical Social Work Clinical Goal(s):   LCSW will call client in next 4 weeks to talk with client about challenges in managing diabetes  Interventions:  . Talked with client about CCM program support . Talked with Client about her job responsibilities . Talked with client about social support network (children are supportive) . Talked with client about completion of ADLs . Talked with client about pain issues of client . Talked with client about her challenges in managing diabetes . Talked with client about her exercise at local gym and about her walking regularly at her job . Encouraged Ana to talk with [facility] as needed for nursing support . Talked with client about her upcoming medical appointments . Talked with client about sleeping challenges of  client  Patient Self Care Activities:   Does ADLs independently Drives car as needed Attends scheduled medical appointments  Patient Self Care Deficits:  . Challenges in managing DM    Initial goal documentation     .  Exercise 3x per week (30 min per time)      She walks a lot at work for  more than 30 minutes 5 days per week    .  Have 3 meals a day      Depression Screen PHQ 2/9 Scores 02/13/2021 02/07/2021 01/30/2021 09/25/2020 05/08/2020 03/28/2020 02/13/2020  PHQ - 2 Score 0 0 0 0 0 0 0  PHQ- 9 Score - - - - - 1 -    Fall Risk Fall Risk  02/13/2021 02/07/2021 01/30/2021 09/25/2020 05/08/2020  Falls in the past year? 0 0 0 0 0  Number falls in past yr: 0 - - - -  Injury with Fall? 0 - - - -  Risk for fall due to : No Fall Risks - - - -    FALL RISK PREVENTION PERTAINING TO THE HOME:  Any stairs in or around the home? Yes  If so, are there any without handrails? No  Home free of loose throw rugs in walkways, pet beds, electrical cords, etc? Yes  Adequate lighting in your home to reduce risk of falls? Yes   ASSISTIVE DEVICES UTILIZED TO PREVENT FALLS:  Life alert? No  Use of a cane, walker or w/c? No  Grab bars in the bathroom? Yes  Shower chair or bench in shower? Yes  Elevated toilet seat or a handicapped toilet? Yes   TIMED UP AND GO:  Was the test performed? No . Telephonic visit.  Cognitive Function: Normal cognitive status assessed by direct observation by this Nurse Health Advisor. No abnormalities found.   MMSE - Mini Mental State Exam 02/07/2021 12/01/2018 11/30/2017  Orientation to time 5 5 5   Orientation to Place 5 4 5   Registration 3 3 3   Attention/ Calculation 5 5 5   Recall 2 2 3   Language- name 2 objects 2 2 2   Language- repeat 1 1 1   Language- follow 3 step command 3 3 3   Language- read & follow direction 1 1 1   Write a sentence 1 1 1   Copy design 1 1 1   Total score 29 28 30      6CIT Screen 02/13/2020 11/30/2017  What Year? 0 points 0 points  What  month? 0 points 0 points  What time? 0 points -  Count back from 20 2 points 0 points  Months in reverse 0 points 0 points  Repeat phrase 0 points 0 points  Total Score 2 -    Immunizations Immunization History  Administered Date(s) Administered  . Fluad Quad(high Dose 65+) 07/25/2019, 09/25/2020  . Influenza Split 08/29/2014  . Influenza, High Dose Seasonal PF 08/22/2015, 08/28/2017, 09/01/2018  . Influenza-Unspecified 08/28/2017  . PFIZER(Purple Top)SARS-COV-2 Vaccination 11/18/2019, 12/09/2019, 09/06/2020  . Pneumococcal Conjugate-13 10/05/2016  . Pneumococcal Polysaccharide-23 01/27/2018    TDAP status: Due, Education has been provided regarding the importance of this vaccine. Advised may receive this vaccine at local pharmacy or Health Dept. Aware to provide a copy of the vaccination record if obtained from local pharmacy or Health Dept. Verbalized acceptance and understanding.  Flu Vaccine status: Up to date  Pneumococcal vaccine status: Up to date  Covid-19 vaccine status: Completed vaccines  Qualifies for Shingles Vaccine? Yes   Zostavax completed No   Shingrix Completed?: No.    Education has been provided regarding the importance of this vaccine. Patient has been advised to call insurance company to determine out of pocket expense if they have not yet received this vaccine. Advised may also receive vaccine at local pharmacy or Health Dept. Verbalized acceptance and understanding.  Screening Tests Health Maintenance  Topic Date Due  . DEXA  SCAN  04/08/2019  . TETANUS/TDAP  04/28/2021 (Originally 04/04/1970)  . MAMMOGRAM  04/03/2021  . INFLUENZA VACCINE  06/09/2021  . HEMOGLOBIN A1C  08/01/2021  . OPHTHALMOLOGY EXAM  09/18/2021  . FOOT EXAM  09/25/2021  . URINE MICROALBUMIN  09/25/2021  . COLONOSCOPY (Pts 45-17yr Insurance coverage will need to be confirmed)  08/06/2022  . COVID-19 Vaccine  Completed  . Hepatitis C Screening  Completed  . PNA vac Low Risk Adult   Completed  . HPV VACCINES  Aged Out    Health Maintenance  Health Maintenance Due  Topic Date Due  . DEXA SCAN  04/08/2019    Colorectal cancer screening: Type of screening: Colonoscopy. Completed 08/06/2014. Repeat every 5 years  Mammogram status: Completed 04/12/2020. Repeat every year  Bone Density status: Ordered 09/29/2020. Pt provided with contact info and advised to call to schedule appt.  Lung Cancer Screening: (Low Dose CT Chest recommended if Age 683-80years, 30 pack-year currently smoking OR have quit w/in 15years.) does not qualify.   Additional Screening:  Hepatitis C Screening: does qualify; Completed 06/12/2016  Vision Screening: Recommended annual ophthalmology exams for early detection of glaucoma and other disorders of the eye. Is the patient up to date with their annual eye exam?  Yes  Who is the provider or what is the name of the office in which the patient attends annual eye exams? Dr LMarin Commentin [place] pt is not established with a provider, would they like to be referred to a provider to establish care? No .   Dental Screening: Recommended annual dental exams for proper oral hygiene  Community Resource Referral / Chronic Care Management: CRR required this visit?  No   CCM required this visit?  No      Plan:     I have personally reviewed and noted the following in the patient's chart:   . Medical and social history . Use of alcohol, tobacco or illicit drugs  . Current medications and supplements . Functional ability and status . Nutritional status . Physical activity . Advanced directives . List of other physicians . Hospitalizations, surgeries, and ER visits in previous 12 months . Vitals . Screenings to include cognitive, depression, and falls . Referrals and appointments  In addition, I have reviewed and discussed with patient certain preventive protocols, quality metrics, and best practice recommendations. A written personalized care plan for  preventive services as well as general preventive health recommendations were provided to patient.     ASandrea Hammond LPN   [id number]  Nurse Notes: She is due for a dexa (made appt note in Dettinger's appt 6/29), Ordered mammogram today and informed patient to make appt towards the end of June 2022.

## 2021-02-21 ENCOUNTER — Telehealth: Payer: PPO

## 2021-03-14 ENCOUNTER — Telehealth: Payer: PPO

## 2021-04-23 ENCOUNTER — Telehealth: Payer: PPO

## 2021-05-02 LAB — HM DIABETES EYE EXAM

## 2021-05-05 ENCOUNTER — Other Ambulatory Visit: Payer: PPO

## 2021-05-05 ENCOUNTER — Other Ambulatory Visit: Payer: Self-pay

## 2021-05-05 DIAGNOSIS — E785 Hyperlipidemia, unspecified: Secondary | ICD-10-CM

## 2021-05-05 DIAGNOSIS — E1169 Type 2 diabetes mellitus with other specified complication: Secondary | ICD-10-CM

## 2021-05-05 DIAGNOSIS — R413 Other amnesia: Secondary | ICD-10-CM

## 2021-05-06 ENCOUNTER — Other Ambulatory Visit: Payer: Self-pay | Admitting: Family Medicine

## 2021-05-06 DIAGNOSIS — R413 Other amnesia: Secondary | ICD-10-CM

## 2021-05-06 LAB — BMP8+EGFR
BUN/Creatinine Ratio: 13 (ref 12–28)
BUN: 13 mg/dL (ref 8–27)
CO2: 23 mmol/L (ref 20–29)
Calcium: 9.5 mg/dL (ref 8.7–10.3)
Chloride: 104 mmol/L (ref 96–106)
Creatinine, Ser: 0.97 mg/dL (ref 0.57–1.00)
Glucose: 131 mg/dL — ABNORMAL HIGH (ref 65–99)
Potassium: 5 mmol/L (ref 3.5–5.2)
Sodium: 142 mmol/L (ref 134–144)
eGFR: 62 mL/min/1.73 (ref >59)

## 2021-05-06 LAB — BAYER DCA HB A1C WAIVED: HB A1C (BAYER DCA - WAIVED): 7.3 % — ABNORMAL HIGH (ref <7.0)

## 2021-05-06 LAB — TSH: TSH: 1.5 u[IU]/mL (ref 0.450–4.500)

## 2021-05-06 LAB — VITAMIN B12: Vitamin B-12: 218 pg/mL — ABNORMAL LOW (ref 232–1245)

## 2021-05-07 ENCOUNTER — Ambulatory Visit (INDEPENDENT_AMBULATORY_CARE_PROVIDER_SITE_OTHER): Payer: PPO | Admitting: Family Medicine

## 2021-05-07 ENCOUNTER — Encounter: Payer: Self-pay | Admitting: Family Medicine

## 2021-05-07 ENCOUNTER — Other Ambulatory Visit: Payer: Self-pay

## 2021-05-07 VITALS — BP 127/58 | HR 82 | Ht 62.0 in | Wt 110.0 lb

## 2021-05-07 DIAGNOSIS — E785 Hyperlipidemia, unspecified: Secondary | ICD-10-CM | POA: Diagnosis not present

## 2021-05-07 DIAGNOSIS — E1169 Type 2 diabetes mellitus with other specified complication: Secondary | ICD-10-CM | POA: Diagnosis not present

## 2021-05-07 NOTE — Progress Notes (Signed)
BP (!) 127/58   Pulse 82   Ht 5' 2"  (1.575 m)   Wt 110 lb (49.9 kg)   SpO2 98%   BMI 20.12 kg/m    Subjective:   Patient ID: Misty [name], female    DOB: Oct 03, 1951, 69 y.o.   MRN: [id number]  HPI: Misty [name] is a 70 y.o. female presenting on 05/07/2021 for Medical Management of Chronic Issues, Hyperlipidemia, and Diabetes   HPI Type 2 diabetes mellitus Patient comes in today for recheck of his diabetes. Patient has been currently taking metformin and glipizide although we do not glipizide on her list and she will bring it with him next time, she thinks it is 5 mg. Patient is not currently on an ACE inhibitor/ARB. Patient has not seen an ophthalmologist this year. Patient denies any issues with their feet. The symptom started onset as an adult hyperlipidemia ARE RELATED TO DM   Hyperlipidemia Patient is coming in for recheck of his hyperlipidemia. The patient is currently taking simvastatin. They deny any issues with myalgias or history of liver damage from it. They deny any focal numbness or weakness or chest pain.   Patient is still having some memory issues, she was unable to take the Aricept because of side effects.  She wants to try Prevagen  Relevant past medical, surgical, family and social history reviewed and updated as indicated. Interim medical history since our last visit reviewed. Allergies and medications reviewed and updated.  Review of Systems  Constitutional:  Negative for chills and fever.  Eyes:  Negative for visual disturbance.  Respiratory:  Negative for chest tightness and shortness of breath.   Cardiovascular:  Negative for chest pain and leg swelling.  Skin:  Negative for rash.  Neurological:  Negative for light-headedness and headaches.  Psychiatric/Behavioral:  Negative for agitation, behavioral problems, decreased concentration, dysphoric mood, self-injury, sleep disturbance and suicidal ideas. The patient is not nervous/anxious.   All  other systems reviewed and are negative.  Per HPI unless specifically indicated above   Allergies as of 05/07/2021       Reactions   Atorvastatin Other (See Comments)   MUSCLE PAIN         Medication List        Accurate as of May 07, 2021  9:38 AM. If you have any questions, ask your nurse or doctor.          STOP taking these medications    donepezil 5 MG disintegrating tablet Commonly known as: ARICEPT ODT Stopped by: Fransisca Kaufmann Caylor Tallarico, MD       TAKE these medications    benzonatate 100 MG capsule Commonly known as: Tessalon Perles Take 1 capsule (100 mg total) by mouth 3 (three) times daily as needed for cough.   blood glucose meter kit and supplies Kit Dispense based on patient and insurance preference. Use up to four times daily as directed. (FOR ICD-9 250.00, 250.01).   famotidine 20 MG tablet Commonly known as: Pepcid Take 1 tablet (20 mg total) by mouth at bedtime.   metFORMIN 500 MG 24 hr tablet Commonly known as: GLUCOPHAGE-XR Take 2 tablets (1,000 mg total) by mouth at bedtime.   OneTouch Ultra test strip Generic drug: glucose blood USE 4 TIMES DAILY AS DRECTED DX: E11.9   onetouch ultrasoft lancets USE TO TEST BLOOD SUGAR 4 TIMES A DAY AS DIRECTED   simvastatin 40 MG tablet Commonly known as: ZOCOR Take 1 tablet (40 mg total) by mouth daily.  VISION FORMULA 2 PO Take 1 capsule by mouth at bedtime.         Objective:   BP (!) 127/58   Pulse 82   Ht 5' 2"  (1.575 m)   Wt 110 lb (49.9 kg)   SpO2 98%   BMI 20.12 kg/m   Wt Readings from Last 3 Encounters:  05/07/21 110 lb (49.9 kg)  02/13/21 113 lb (51.3 kg)  02/07/21 113 lb (51.3 kg)    Physical Exam Vitals and nursing note reviewed.  Constitutional:      General: She is not in acute distress.    Appearance: She is well-developed. She is not diaphoretic.  Eyes:     Conjunctiva/sclera: Conjunctivae normal.  Cardiovascular:     Rate and Rhythm: Normal rate and regular  rhythm.     Heart sounds: Normal heart sounds. No murmur heard. Pulmonary:     Effort: Pulmonary effort is normal. No respiratory distress.     Breath sounds: Normal breath sounds. No wheezing.  Musculoskeletal:        General: No tenderness. Normal range of motion.  Skin:    General: Skin is warm and dry.     Findings: No rash.  Neurological:     Mental Status: She is alert and oriented to person, place, and time.     Coordination: Coordination normal.  Psychiatric:        Behavior: Behavior normal.    Results for orders placed or performed in visit on 05/05/21  TSH  Result Value Ref Range   TSH 1.500 0.450 - 4.500 uIU/mL  Vitamin B12  Result Value Ref Range   Vitamin B-12 218 (L) 232 - 1,245 pg/mL  BMP8+EGFR  Result Value Ref Range   Glucose 131 (H) 65 - 99 mg/dL   BUN 13 8 - 27 mg/dL   Creatinine, Ser 0.97 0.57 - 1.00 mg/dL   eGFR 62 >59 mL/min/1.73   BUN/Creatinine Ratio 13 12 - 28   Sodium 142 134 - 144 mmol/L   Potassium 5.0 3.5 - 5.2 mmol/L   Chloride 104 96 - 106 mmol/L   CO2 23 20 - 29 mmol/L   Calcium 9.5 8.7 - 10.3 mg/dL  Bayer DCA Hb A1c Waived  Result Value Ref Range   HB A1C (BAYER DCA - WAIVED) 7.3 (H) <7.0 %    Assessment & Plan:   Problem List Items Addressed This Visit       Endocrine   Type 2 diabetes mellitus ([place]) - Primary   Hyperlipidemia associated with type 2 diabetes mellitus (HCC)    A1c 7.3, running borderline, slightly elevated, she wants to try diet and exercise again and see if she can bring it down on her own but may consider Januvia in the future. Follow up plan: Return in about 3 months (around 08/07/2021), or if symptoms worsen or fail to improve, for Diabetes.  Counseling provided for all of the vaccine components No orders of the defined types were placed in this encounter.   Caryl Pina, MD [facility] 05/07/2021, 9:38 AM

## 2021-05-15 ENCOUNTER — Ambulatory Visit: Payer: PPO | Admitting: Family Medicine

## 2021-05-19 ENCOUNTER — Ambulatory Visit
Admission: RE | Admit: 2021-05-19 | Discharge: 2021-05-19 | Disposition: A | Discharge status: Home / Self Care | Payer: PPO | Source: Ambulatory Visit | Attending: Family Medicine | Admitting: Family Medicine

## 2021-05-19 ENCOUNTER — Other Ambulatory Visit: Payer: Self-pay

## 2021-05-19 DIAGNOSIS — Z1231 Encounter for screening mammogram for malignant neoplasm of breast: Secondary | ICD-10-CM | POA: Diagnosis not present

## 2021-06-09 ENCOUNTER — Telehealth: Payer: PPO

## 2021-06-10 DIAGNOSIS — N950 Postmenopausal bleeding: Secondary | ICD-10-CM | POA: Diagnosis not present

## 2021-06-10 DIAGNOSIS — R300 Dysuria: Secondary | ICD-10-CM | POA: Diagnosis not present

## 2021-06-26 DIAGNOSIS — E119 Type 2 diabetes mellitus without complications: Secondary | ICD-10-CM | POA: Diagnosis not present

## 2021-06-26 DIAGNOSIS — N858 Other specified noninflammatory disorders of uterus: Secondary | ICD-10-CM | POA: Diagnosis not present

## 2021-06-26 DIAGNOSIS — N950 Postmenopausal bleeding: Secondary | ICD-10-CM | POA: Diagnosis not present

## 2021-06-26 DIAGNOSIS — Z9851 Tubal ligation status: Secondary | ICD-10-CM | POA: Diagnosis not present

## 2021-07-15 ENCOUNTER — Telehealth: Payer: PPO

## 2021-08-01 ENCOUNTER — Other Ambulatory Visit: Payer: PPO

## 2021-08-01 ENCOUNTER — Other Ambulatory Visit: Payer: Self-pay

## 2021-08-01 DIAGNOSIS — Z780 Asymptomatic menopausal state: Secondary | ICD-10-CM | POA: Diagnosis not present

## 2021-08-01 DIAGNOSIS — E785 Hyperlipidemia, unspecified: Secondary | ICD-10-CM | POA: Diagnosis not present

## 2021-08-01 DIAGNOSIS — E1169 Type 2 diabetes mellitus with other specified complication: Secondary | ICD-10-CM

## 2021-08-01 LAB — BAYER DCA HB A1C WAIVED: HB A1C (BAYER DCA - WAIVED): 7.1 % — ABNORMAL HIGH (ref 4.8–5.6)

## 2021-08-02 LAB — CBC WITH DIFFERENTIAL/PLATELET
Basophils Absolute: 0.1 10*3/uL (ref 0.0–0.2)
Basos: 1 %
EOS (ABSOLUTE): 0.1 10*3/uL (ref 0.0–0.4)
Eos: 2 %
Hematocrit: 37.8 % (ref 34.0–46.6)
Hemoglobin: 12.4 g/dL (ref 11.1–15.9)
Immature Grans (Abs): 0 10*3/uL (ref 0.0–0.1)
Immature Granulocytes: 0 %
Lymphocytes Absolute: 3.5 10*3/uL — ABNORMAL HIGH (ref 0.7–3.1)
Lymphs: 60 %
MCH: 32 pg (ref 26.6–33.0)
MCHC: 32.8 g/dL (ref 31.5–35.7)
MCV: 97 fL (ref 79–97)
Monocytes Absolute: 0.6 10*3/uL (ref 0.1–0.9)
Monocytes: 10 %
Neutrophils Absolute: 1.6 10*3/uL (ref 1.4–7.0)
Neutrophils: 27 %
Platelets: 254 10*3/uL (ref 150–450)
RBC: 3.88 x10E6/uL (ref 3.77–5.28)
RDW: 11.9 % (ref 11.7–15.4)
WBC: 5.9 10*3/uL (ref 3.4–10.8)

## 2021-08-02 LAB — BMP8+EGFR
BUN/Creatinine Ratio: 18 (ref 12–28)
BUN: 19 mg/dL (ref 8–27)
CO2: 25 mmol/L (ref 20–29)
Calcium: 9.1 mg/dL (ref 8.7–10.3)
Chloride: 105 mmol/L (ref 96–106)
Creatinine, Ser: 1.03 mg/dL — ABNORMAL HIGH (ref 0.57–1.00)
Glucose: 138 mg/dL — ABNORMAL HIGH (ref 65–99)
Potassium: 5.1 mmol/L (ref 3.5–5.2)
Sodium: 145 mmol/L — ABNORMAL HIGH (ref 134–144)
eGFR: 58 mL/min/{1.73_m2} — ABNORMAL LOW (ref >59)

## 2021-08-02 LAB — VITAMIN B12: Vitamin B-12: 206 pg/mL — ABNORMAL LOW (ref 232–1245)

## 2021-08-04 ENCOUNTER — Encounter: Payer: Self-pay | Admitting: Family Medicine

## 2021-08-04 ENCOUNTER — Other Ambulatory Visit: Payer: Self-pay

## 2021-08-04 ENCOUNTER — Ambulatory Visit (INDEPENDENT_AMBULATORY_CARE_PROVIDER_SITE_OTHER): Payer: PPO | Admitting: Family Medicine

## 2021-08-04 VITALS — BP 155/74 | HR 91 | Ht 62.0 in | Wt 115.0 lb

## 2021-08-04 DIAGNOSIS — E785 Hyperlipidemia, unspecified: Secondary | ICD-10-CM | POA: Diagnosis not present

## 2021-08-04 DIAGNOSIS — R030 Elevated blood-pressure reading, without diagnosis of hypertension: Secondary | ICD-10-CM | POA: Diagnosis not present

## 2021-08-04 DIAGNOSIS — Z23 Encounter for immunization: Secondary | ICD-10-CM | POA: Diagnosis not present

## 2021-08-04 DIAGNOSIS — E1169 Type 2 diabetes mellitus with other specified complication: Secondary | ICD-10-CM

## 2021-08-04 NOTE — Progress Notes (Signed)
BP (!) 155/74   Pulse 91   Ht 5' 2"  (1.575 m)   Wt 115 lb (52.2 kg)   SpO2 99%   BMI 21.03 kg/m    Subjective:   Patient ID: Misty [name], female    DOB: 08/05/1951, 70 y.o.   MRN: [id number]  HPI: Misty [name] is a 70 y.o. female presenting on 08/04/2021 for Medical Management of Chronic Issues, Hyperlipidemia,   HPI Elevated blood pressure Patient is currently on no medication, and their blood pressure today is 155/74, does not normally this high, last visit was 127/58, she is very much more stressed.. Patient denies any lightheadedness or dizziness. Patient denies headaches, blurred vision, chest pains, shortness of breath, or weakness. Denies any side effects from medication and is content with current medication.   Type 2 diabetes mellitus Patient comes in today for recheck of his diabetes. Patient has been currently taking metformin and glipizide but she cannot recall the dose. Patient is not currently on an ACE inhibitor/ARB. Patient has seen an ophthalmologist this year. Patient denies any issues with their feet. The symptom started onset as an adult hypertension and hyperlipidemia ARE RELATED TO DM   Hyperlipidemia Patient is coming in for recheck of his hyperlipidemia. The patient is currently taking simvastatin. They deny any issues with myalgias or history of liver damage from it. They deny any focal numbness or weakness or chest pain.    Relevant past medical, surgical, family and social history reviewed and updated as indicated. Interim medical history since our last visit reviewed. Allergies and medications reviewed and updated.  Review of Systems  Constitutional:  Negative for chills and fever.  Eyes:  Negative for visual disturbance.  Respiratory:  Negative for chest tightness and shortness of breath.   Cardiovascular:  Negative for chest pain and leg swelling.  Musculoskeletal:  Negative for back pain and gait problem.  Skin:  Negative for rash.   Neurological:  Negative for dizziness, light-headedness and headaches.  Psychiatric/Behavioral:  Negative for agitation and behavioral problems.   All other systems reviewed and are negative.  Per HPI unless specifically indicated above   Allergies as of 08/04/2021       Reactions   Atorvastatin Other (See Comments)   MUSCLE PAIN         Medication List        Accurate as of August 04, 2021  9:41 AM. If you have any questions, ask your nurse or doctor.          benzonatate 100 MG capsule Commonly known as: Tessalon Perles Take 1 capsule (100 mg total) by mouth 3 (three) times daily as needed for cough.   blood glucose meter kit and supplies Kit Dispense based on patient and insurance preference. Use up to four times daily as directed. (FOR ICD-9 250.00, 250.01).   famotidine 20 MG tablet Commonly known as: Pepcid Take 1 tablet (20 mg total) by mouth at bedtime.   glipiZIDE 5 MG 24 hr tablet Commonly known as: GLUCOTROL XL Take 5 mg by mouth daily with breakfast.   metFORMIN 500 MG 24 hr tablet Commonly known as: GLUCOPHAGE-XR Take 2 tablets (1,000 mg total) by mouth at bedtime.   OneTouch Ultra test strip Generic drug: glucose blood USE 4 TIMES DAILY AS DRECTED DX: E11.9   onetouch ultrasoft lancets USE TO TEST BLOOD SUGAR 4 TIMES A DAY AS DIRECTED   simvastatin 40 MG tablet Commonly known as: ZOCOR Take 1 tablet (40 mg total)  by mouth daily.   VISION FORMULA 2 PO Take 1 capsule by mouth at bedtime.         Objective:   BP (!) 155/74   Pulse 91   Ht 5' 2"  (1.575 m)   Wt 115 lb (52.2 kg)   SpO2 99%   BMI 21.03 kg/m   Wt Readings from Last 3 Encounters:  08/04/21 115 lb (52.2 kg)  05/07/21 110 lb (49.9 kg)  02/13/21 113 lb (51.3 kg)    Physical Exam Vitals and nursing note reviewed.  Constitutional:      General: She is not in acute distress.    Appearance: She is well-developed. She is not diaphoretic.  Eyes:      Conjunctiva/sclera: Conjunctivae normal.  Cardiovascular:     Rate and Rhythm: Normal rate and regular rhythm.     Heart sounds: Normal heart sounds. No murmur heard. Pulmonary:     Effort: Pulmonary effort is normal. No respiratory distress.     Breath sounds: Normal breath sounds. No wheezing.  Musculoskeletal:        General: No tenderness. Normal range of motion.  Skin:    General: Skin is warm and dry.     Findings: No rash.  Neurological:     Mental Status: She is alert and oriented to person, place, and time.     Coordination: Coordination normal.  Psychiatric:        Behavior: Behavior normal.    Results for orders placed or performed in visit on 08/01/21  CBC with Differential/Platelet  Result Value Ref Range   WBC 5.9 3.4 - 10.8 x10E3/uL   RBC 3.88 3.77 - 5.28 x10E6/uL   Hemoglobin 12.4 11.1 - 15.9 g/dL   Hematocrit 37.8 34.0 - 46.6 %   MCV 97 79 - 97 fL   MCH 32.0 26.6 - 33.0 pg   MCHC 32.8 31.5 - 35.7 g/dL   RDW 11.9 11.7 - 15.4 %   Platelets 254 150 - 450 x10E3/uL   Neutrophils 27 Not Estab. %   Lymphs 60 Not Estab. %   Monocytes 10 Not Estab. %   Eos 2 Not Estab. %   Basos 1 Not Estab. %   Neutrophils Absolute 1.6 1.4 - 7.0 x10E3/uL   Lymphocytes Absolute 3.5 (H) 0.7 - 3.1 x10E3/uL   Monocytes Absolute 0.6 0.1 - 0.9 x10E3/uL   EOS (ABSOLUTE) 0.1 0.0 - 0.4 x10E3/uL   Basophils Absolute 0.1 0.0 - 0.2 x10E3/uL   Immature Granulocytes 0 Not Estab. %   Immature Grans (Abs) 0.0 0.0 - 0.1 x10E3/uL  Bayer DCA Hb A1c Waived  Result Value Ref Range   HB A1C (BAYER DCA - WAIVED) 7.1 (H) 4.8 - 5.6 %  Vitamin B12  Result Value Ref Range   Vitamin B-12 206 (L) 232 - 1,245 pg/mL  BMP8+EGFR  Result Value Ref Range   Glucose 138 (H) 65 - 99 mg/dL   BUN 19 8 - 27 mg/dL   Creatinine, Ser 1.03 (H) 0.57 - 1.00 mg/dL   eGFR 58 (L) >59 mL/min/1.73   BUN/Creatinine Ratio 18 12 - 28   Sodium 145 (H) 134 - 144 mmol/L   Potassium 5.1 3.5 - 5.2 mmol/L   Chloride 105 96 -  106 mmol/L   CO2 25 20 - 29 mmol/L   Calcium 9.1 8.7 - 10.3 mg/dL    Assessment & Plan:   Problem List Items Addressed This Visit       Endocrine   Type  2 diabetes mellitus ([place]) - Primary   Relevant Medications   glipiZIDE (GLUCOTROL XL) 5 MG 24 hr tablet   Hyperlipidemia associated with type 2 diabetes mellitus (HCC)   Relevant Medications   glipiZIDE (GLUCOTROL XL) 5 MG 24 hr tablet   Other Visit Diagnoses     Elevated blood pressure reading           Patient's blood pressure is a little bit elevated, we will keep an eye in the future, her blood pressures always been normal before.  Able to see 7.1 so looks good, no change in medication. Follow up plan: Return in about 3 months (around 11/03/2021), or if symptoms worsen or fail to improve, for Diabetes and blood pressure and cholesterol.  Counseling provided for all of the vaccine components No orders of the defined types were placed in this encounter.   Caryl Pina, MD [facility] 08/04/2021, 9:41 AM

## 2021-09-01 ENCOUNTER — Telehealth: Payer: PPO

## 2021-10-30 ENCOUNTER — Other Ambulatory Visit: Payer: PPO

## 2021-10-30 DIAGNOSIS — R413 Other amnesia: Secondary | ICD-10-CM

## 2021-10-30 DIAGNOSIS — E785 Hyperlipidemia, unspecified: Secondary | ICD-10-CM | POA: Diagnosis not present

## 2021-10-30 DIAGNOSIS — E1169 Type 2 diabetes mellitus with other specified complication: Secondary | ICD-10-CM

## 2021-10-30 LAB — BAYER DCA HB A1C WAIVED: HB A1C (BAYER DCA - WAIVED): 7.5 % — ABNORMAL HIGH (ref 4.8–5.6)

## 2021-10-31 ENCOUNTER — Other Ambulatory Visit: Payer: Self-pay

## 2021-10-31 ENCOUNTER — Encounter: Payer: Self-pay | Admitting: Family Medicine

## 2021-10-31 ENCOUNTER — Ambulatory Visit (INDEPENDENT_AMBULATORY_CARE_PROVIDER_SITE_OTHER): Payer: PPO | Admitting: Family Medicine

## 2021-10-31 VITALS — BP 122/54 | HR 90 | Ht 62.0 in | Wt 115.0 lb

## 2021-10-31 DIAGNOSIS — E1169 Type 2 diabetes mellitus with other specified complication: Secondary | ICD-10-CM | POA: Diagnosis not present

## 2021-10-31 DIAGNOSIS — E785 Hyperlipidemia, unspecified: Secondary | ICD-10-CM | POA: Diagnosis not present

## 2021-10-31 LAB — CBC WITH DIFFERENTIAL/PLATELET
Basophils Absolute: 0.1 10*3/uL (ref 0.0–0.2)
Basos: 1 %
EOS (ABSOLUTE): 0.1 10*3/uL (ref 0.0–0.4)
Eos: 2 %
Hematocrit: 37.5 % (ref 34.0–46.6)
Hemoglobin: 12.5 g/dL (ref 11.1–15.9)
Immature Grans (Abs): 0 10*3/uL (ref 0.0–0.1)
Immature Granulocytes: 0 %
Lymphocytes Absolute: 3 10*3/uL (ref 0.7–3.1)
Lymphs: 53 %
MCH: 31.5 pg (ref 26.6–33.0)
MCHC: 33.3 g/dL (ref 31.5–35.7)
MCV: 95 fL (ref 79–97)
Monocytes Absolute: 0.6 10*3/uL (ref 0.1–0.9)
Monocytes: 11 %
Neutrophils Absolute: 1.8 10*3/uL (ref 1.4–7.0)
Neutrophils: 33 %
Platelets: 242 10*3/uL (ref 150–450)
RBC: 3.97 x10E6/uL (ref 3.77–5.28)
RDW: 11.8 % (ref 11.7–15.4)
WBC: 5.5 10*3/uL (ref 3.4–10.8)

## 2021-10-31 LAB — CMP14+EGFR
ALT: 20 IU/L (ref 0–32)
AST: 25 IU/L (ref 0–40)
Albumin/Globulin Ratio: 2.3 — ABNORMAL HIGH (ref 1.2–2.2)
Albumin: 4.1 g/dL (ref 3.7–4.7)
Alkaline Phosphatase: 88 IU/L (ref 44–121)
BUN/Creatinine Ratio: 17 (ref 12–28)
BUN: 17 mg/dL (ref 8–27)
Bilirubin Total: 0.2 mg/dL (ref 0.0–1.2)
CO2: 22 mmol/L (ref 20–29)
Calcium: 9.1 mg/dL (ref 8.7–10.3)
Chloride: 102 mmol/L (ref 96–106)
Creatinine, Ser: 0.98 mg/dL (ref 0.57–1.00)
Globulin, Total: 1.8 g/dL (ref 1.5–4.5)
Glucose: 148 mg/dL — ABNORMAL HIGH (ref 70–99)
Potassium: 5 mmol/L (ref 3.5–5.2)
Sodium: 141 mmol/L (ref 134–144)
Total Protein: 5.9 g/dL — ABNORMAL LOW (ref 6.0–8.5)
eGFR: 62 mL/min/{1.73_m2} (ref >59)

## 2021-10-31 LAB — LIPID PANEL
Chol/HDL Ratio: 2 ratio (ref 0.0–4.4)
Cholesterol, Total: 157 mg/dL (ref 100–199)
HDL: 80 mg/dL (ref >39)
LDL Chol Calc (NIH): 57 mg/dL (ref 0–99)
Triglycerides: 116 mg/dL (ref 0–149)
VLDL Cholesterol Cal: 20 mg/dL (ref 5–40)

## 2021-10-31 LAB — TSH: TSH: 1.32 u[IU]/mL (ref 0.450–4.500)

## 2021-10-31 MED ORDER — EMPAGLIFLOZIN 10 MG PO TABS
10.0000 mg | ORAL_TABLET | Freq: Every day | ORAL | 3 refills | Status: DC
Start: 2021-10-31 — End: 2022-01-30

## 2021-10-31 MED ORDER — ONETOUCH ULTRA VI STRP: ORAL_STRIP | 3 refills | Status: DC

## 2021-10-31 MED ORDER — ONETOUCH ULTRASOFT LANCETS MISC: 12 refills | Status: DC

## 2021-10-31 NOTE — Progress Notes (Signed)
BP (!) 122/54    Pulse 90    Ht _0  (1.575 m)    Wt 115 lb (52.2 kg)    SpO2 99%    BMI 21.03 kg/m    Subjective:   Patient ID: Misty [name], female    DOB: Apr 02, 1951, 70 y.o.   MRN: [id number]  HPI: Misty [name] is a 70 y.o. female presenting on 10/31/2021 for Medical Management of Chronic Issues, Diabetes, and Hyperlipidemia   HPI Type 2 diabetes mellitus Patient comes in today for recheck of his diabetes. Patient has been currently taking metformin and glipizide. Patient is not currently on an ACE inhibitor/ARB. Patient has seen an ophthalmologist this year. Patient denies any issues with their feet. The symptom started onset as an adult hyperlipidemia ARE RELATED TO DM   Hyperlipidemia Patient is coming in for recheck of his hyperlipidemia. The patient is currently taking simvastatin. They deny any issues with myalgias or history of liver damage from it. They deny any focal numbness or weakness or chest pain.   Relevant past medical, surgical, family and social history reviewed and updated as indicated. Interim medical history since our last visit reviewed. Allergies and medications reviewed and updated.  Review of Systems  Constitutional:  Negative for chills and fever.  Eyes:  Negative for visual disturbance.  Respiratory:  Negative for chest tightness and shortness of breath.   Cardiovascular:  Negative for chest pain and leg swelling.  Genitourinary:  Positive for frequency.  Musculoskeletal:  Negative for back pain and gait problem.  Skin:  Negative for rash.  Neurological:  Negative for light-headedness and headaches.  Psychiatric/Behavioral:  Negative for agitation and behavioral problems.   All other systems reviewed and are negative.  Per HPI unless specifically indicated above   Allergies as of 10/31/2021       Reactions   Atorvastatin Other (See Comments)   MUSCLE PAIN         Medication List        Accurate as of October 31, 2021  10:48 AM. If you have any questions, ask your nurse or doctor.          benzonatate 100 MG capsule Commonly known as: Tessalon Perles Take 1 capsule (100 mg total) by mouth 3 (three) times daily as needed for cough.   blood glucose meter kit and supplies Kit Dispense based on patient and insurance preference. Use up to four times daily as directed. (FOR ICD-9 250.00, 250.01).   empagliflozin 10 MG Tabs tablet Commonly known as: Jardiance Take 1 tablet (10 mg total) by mouth daily before breakfast. Started by: Fransisca Kaufmann Rosario Duey, MD   famotidine 20 MG tablet Commonly known as: Pepcid Take 1 tablet (20 mg total) by mouth at bedtime.   glipiZIDE 5 MG 24 hr tablet Commonly known as: GLUCOTROL XL Take 5 mg by mouth daily with breakfast.   metFORMIN 500 MG 24 hr tablet Commonly known as: GLUCOPHAGE-XR Take 2 tablets (1,000 mg total) by mouth at bedtime.   OneTouch Ultra test strip Generic drug: glucose blood USE 4 TIMES DAILY AS DRECTED DX: E11.9   onetouch ultrasoft lancets USE TO TEST BLOOD SUGAR 4 TIMES A DAY AS DIRECTED   simvastatin 40 MG tablet Commonly known as: ZOCOR Take 1 tablet (40 mg total) by mouth daily.   VISION FORMULA 2 PO Take 1 capsule by mouth at bedtime.         Objective:   BP (!) 122/54  Pulse 90    Ht _0  (1.575 m)    Wt 115 lb (52.2 kg)    SpO2 99%    BMI 21.03 kg/m   Wt Readings from Last 3 Encounters:  10/31/21 115 lb (52.2 kg)  08/04/21 115 lb (52.2 kg)  05/07/21 110 lb (49.9 kg)    Physical Exam Vitals and nursing note reviewed.  Constitutional:      General: She is not in acute distress.    Appearance: She is well-developed. She is not diaphoretic.  Eyes:     Conjunctiva/sclera: Conjunctivae normal.  Cardiovascular:     Rate and Rhythm: Normal rate and regular rhythm.     Heart sounds: Normal heart sounds. No murmur heard. Pulmonary:     Effort: Pulmonary effort is normal. No respiratory distress.     Breath sounds:  Normal breath sounds. No wheezing.  Musculoskeletal:        General: No tenderness. Normal range of motion.  Skin:    General: Skin is warm and dry.     Findings: No rash.  Neurological:     Mental Status: She is alert and oriented to person, place, and time.     Coordination: Coordination normal.  Psychiatric:        Behavior: Behavior normal.    Results for orders placed or performed in visit on 10/30/21  CBC with Differential/Platelet  Result Value Ref Range   WBC 5.5 3.4 - 10.8 x10E3/uL   RBC 3.97 3.77 - 5.28 x10E6/uL   Hemoglobin 12.5 11.1 - 15.9 g/dL   Hematocrit 37.5 34.0 - 46.6 %   MCV 95 79 - 97 fL   MCH 31.5 26.6 - 33.0 pg   MCHC 33.3 31.5 - 35.7 g/dL   RDW 11.8 11.7 - 15.4 %   Platelets 242 150 - 450 x10E3/uL   Neutrophils 33 Not Estab. %   Lymphs 53 Not Estab. %   Monocytes 11 Not Estab. %   Eos 2 Not Estab. %   Basos 1 Not Estab. %   Neutrophils Absolute 1.8 1.4 - 7.0 x10E3/uL   Lymphocytes Absolute 3.0 0.7 - 3.1 x10E3/uL   Monocytes Absolute 0.6 0.1 - 0.9 x10E3/uL   EOS (ABSOLUTE) 0.1 0.0 - 0.4 x10E3/uL   Basophils Absolute 0.1 0.0 - 0.2 x10E3/uL   Immature Granulocytes 0 Not Estab. %   Immature Grans (Abs) 0.0 0.0 - 0.1 x10E3/uL  CMP14+EGFR  Result Value Ref Range   Glucose 148 (H) 70 - 99 mg/dL   BUN 17 8 - 27 mg/dL   Creatinine, Ser 0.98 0.57 - 1.00 mg/dL   eGFR 62 >59 mL/min/1.73   BUN/Creatinine Ratio 17 12 - 28   Sodium 141 134 - 144 mmol/L   Potassium 5.0 3.5 - 5.2 mmol/L   Chloride 102 96 - 106 mmol/L   CO2 22 20 - 29 mmol/L   Calcium 9.1 8.7 - 10.3 mg/dL   Total Protein 5.9 (L) 6.0 - 8.5 g/dL   Albumin 4.1 3.7 - 4.7 g/dL   Globulin, Total 1.8 1.5 - 4.5 g/dL   Albumin/Globulin Ratio 2.3 (H) 1.2 - 2.2   Bilirubin Total 0.2 0.0 - 1.2 mg/dL   Alkaline Phosphatase 88 44 - 121 IU/L   AST 25 0 - 40 IU/L   ALT 20 0 - 32 IU/L  Lipid panel  Result Value Ref Range   Cholesterol, Total 157 100 - 199 mg/dL   Triglycerides 116 0 - 149 mg/dL   HDL  80 >  39 mg/dL   VLDL Cholesterol Cal 20 5 - 40 mg/dL   LDL Chol Calc (NIH) 57 0 - 99 mg/dL   Chol/HDL Ratio 2.0 0.0 - 4.4 ratio  TSH  Result Value Ref Range   TSH 1.320 0.450 - 4.500 uIU/mL  Bayer DCA Hb A1c Waived  Result Value Ref Range   HB A1C (BAYER DCA - WAIVED) 7.5 (H) 4.8 - 5.6 %    Assessment & Plan:   Problem List Items Addressed This Visit       Endocrine   Type 2 diabetes mellitus ([place]) - Primary   Relevant Medications   Lancets (ONETOUCH ULTRASOFT) lancets   glucose blood (ONETOUCH ULTRA) test strip   empagliflozin (JARDIANCE) 10 MG TABS tablet   Other Relevant Orders   Microalbumin / creatinine urine ratio   Hyperlipidemia associated with type 2 diabetes mellitus (HCC)   Relevant Medications   empagliflozin (JARDIANCE) 10 MG TABS tablet  A1c, will add Jardiance.  Continue glipizide and metformin  Patient has some bladder frequency and urinary frequency and overactive bladder it sounds like, will recommend Kegel's to see if we can strengthen pelvic floor  Follow up plan: Return in about 3 months (around 01/29/2022), or if symptoms worsen or fail to improve, for Diabetes recheck.  Counseling provided for all of the vaccine components Orders Placed This Encounter  Procedures   Microalbumin / creatinine urine ratio    Caryl Pina, MD [facility] 10/31/2021, 10:48 AM

## 2021-12-14 ENCOUNTER — Other Ambulatory Visit: Payer: Self-pay | Admitting: Family Medicine

## 2021-12-14 DIAGNOSIS — E1169 Type 2 diabetes mellitus with other specified complication: Secondary | ICD-10-CM

## 2021-12-25 ENCOUNTER — Encounter: Payer: Self-pay | Admitting: Family Medicine

## 2021-12-25 ENCOUNTER — Ambulatory Visit (INDEPENDENT_AMBULATORY_CARE_PROVIDER_SITE_OTHER): Payer: PPO | Admitting: Family Medicine

## 2021-12-25 VITALS — BP 138/76 | HR 88 | Temp 97.5°F | Ht 62.0 in | Wt 114.4 lb

## 2021-12-25 DIAGNOSIS — K529 Noninfective gastroenteritis and colitis, unspecified: Secondary | ICD-10-CM | POA: Diagnosis not present

## 2021-12-25 MED ORDER — ONDANSETRON 4 MG PO TBDP: 4.0000 mg | ORAL_TABLET | Freq: Three times a day (TID) | ORAL | 0 refills | Status: DC | PRN

## 2021-12-25 NOTE — Progress Notes (Signed)
Assessment & Plan:  1. Gastroenteritis Education provided on gastroenteritis.  Encouraged adequate hydration.  Prescribe Zofran to assist with patient keeping fluids down.  Advised when she does start eating more that it needs to be more bland foods for her stomach to tolerate it. - ondansetron (ZOFRAN-ODT) 4 MG disintegrating tablet; Take 1 tablet (4 mg total) by mouth every 8 (eight) hours as needed for nausea or vomiting.  Dispense: 30 tablet; Refill: 0   Follow up plan: Return if symptoms worsen or fail to improve.  Hendricks Limes, MSN, APRN, FNP-C [facility]  Subjective:   Patient ID: Misty [name], female    DOB: 05-Jan-1951, 71 y.o.   MRN: [id number]  HPI: Misty [name] is a 71 y.o. female presenting on 12/25/2021 for Abdominal Pain, Nausea, and Emesis  Patient c/o abdominal pain, nausea, and vomiting that started last night around 1 AM. Pain occurs across her lower abdomen. She has vomited every half hour through the night and only once today. Denies diarrhea and fever. She has been feeling dizzy. She is drinking plenty of fluids and urinating per her usual. She has not ate much as she does not have an appetite.    ROS: Negative unless specifically indicated above in HPI.   Relevant past medical history reviewed and updated as indicated.   Allergies and medications reviewed and updated.   Current Outpatient Medications:    benzonatate (TESSALON PERLES) 100 MG capsule, Take 1 capsule (100 mg total) by mouth 3 (three) times daily as needed for cough., Disp: 30 capsule, Rfl: 1   blood glucose meter kit and supplies KIT, Dispense based on patient and insurance preference. Use up to four times daily as directed. (FOR ICD-9 250.00, 250.01)., Disp: 1 each, Rfl: 0   empagliflozin (JARDIANCE) 10 MG TABS tablet, Take 1 tablet (10 mg total) by mouth daily before breakfast., Disp: 90 tablet, Rfl: 3   famotidine (PEPCID) 20 MG tablet, Take 1 tablet (20 mg  total) by mouth at bedtime., Disp: 90 tablet, Rfl: 1   glipiZIDE (GLUCOTROL XL) 5 MG 24 hr tablet, Take 5 mg by mouth daily with breakfast., Disp: , Rfl:    glucose blood (ONETOUCH ULTRA) test strip, USE 4 TIMES DAILY AS DRECTED DX: E11.9, Disp: 400 strip, Rfl: 3   Lancets (ONETOUCH ULTRASOFT) lancets, USE TO TEST BLOOD SUGAR 4 TIMES A DAY AS DIRECTED, Disp: 100 each, Rfl: 12   metFORMIN (GLUCOPHAGE-XR) 500 MG 24 hr tablet, Take 2 tablets (1,000 mg total) by mouth at bedtime., Disp: 180 tablet, Rfl: 3   Multiple Vitamins-Minerals (VISION FORMULA 2 PO), Take 1 capsule by mouth at bedtime., Disp: , Rfl:    simvastatin (ZOCOR) 40 MG tablet, Take 1 tablet (40 mg total) by mouth daily., Disp: 90 tablet, Rfl: 3  Allergies  Allergen Reactions   Atorvastatin Other (See Comments)    MUSCLE PAIN     Objective:   BP 138/76    Pulse 88    Temp (!) 97.5 F (36.4 C)    Ht _0  (1.575 m)    Wt 114 lb 6.4 oz (51.9 kg)    SpO2 95%    BMI 20.92 kg/m    Physical Exam Vitals reviewed.  Constitutional:      General: She is not in acute distress.    Appearance: Normal appearance. She is not ill-appearing, toxic-appearing or diaphoretic.  HENT:     Head: Normocephalic and atraumatic.  Eyes:     General:  No scleral icterus.       Right eye: No discharge.        Left eye: No discharge.     Conjunctiva/sclera: Conjunctivae normal.  Cardiovascular:     Rate and Rhythm: Normal rate.  Pulmonary:     Effort: Pulmonary effort is normal. No respiratory distress.  Abdominal:     General: Abdomen is flat. Bowel sounds are normal. There is no distension or abdominal bruit. There are no signs of injury.     Palpations: Abdomen is soft. There is no shifting dullness, fluid wave, hepatomegaly, splenomegaly, mass or pulsatile mass.     Tenderness: There is abdominal tenderness in the right lower quadrant and left lower quadrant.  Musculoskeletal:        General: Normal range of motion.     Cervical back: Normal  range of motion.  Skin:    General: Skin is warm and dry.     Capillary Refill: Capillary refill takes less than 2 seconds.  Neurological:     General: No focal deficit present.     Mental Status: She is alert and oriented to person, place, and time. Mental status is at baseline.  Psychiatric:        Mood and Affect: Mood normal.        Behavior: Behavior normal.        Thought Content: Thought content normal.        Judgment: Judgment normal.

## 2021-12-28 ENCOUNTER — Encounter: Payer: Self-pay | Admitting: Family Medicine

## 2021-12-29 ENCOUNTER — Telehealth: Payer: Self-pay

## 2021-12-29 NOTE — Telephone Encounter (Signed)
Ondansetron 4MG  dispersible tablets  (Key: [id number])  Sent to plan

## 2021-12-31 NOTE — Telephone Encounter (Signed)
Fax approval came in for this coverage good from 20/20/23-12/29/22

## 2021-12-31 NOTE — Telephone Encounter (Signed)
Electronic Prior Authorization not supported. Submit via other [employer] Medicare Coverage Determination Form Prior Authorization for [place] [employer] Members. [phone] [phone]

## 2022-01-06 ENCOUNTER — Other Ambulatory Visit: Payer: Self-pay | Admitting: Family Medicine

## 2022-01-28 ENCOUNTER — Other Ambulatory Visit: Payer: Self-pay | Admitting: Family Medicine

## 2022-01-28 ENCOUNTER — Other Ambulatory Visit: Payer: PPO

## 2022-01-28 ENCOUNTER — Other Ambulatory Visit: Payer: Self-pay

## 2022-01-28 DIAGNOSIS — E1169 Type 2 diabetes mellitus with other specified complication: Secondary | ICD-10-CM

## 2022-01-28 DIAGNOSIS — R739 Hyperglycemia, unspecified: Secondary | ICD-10-CM | POA: Diagnosis not present

## 2022-01-28 DIAGNOSIS — E785 Hyperlipidemia, unspecified: Secondary | ICD-10-CM | POA: Diagnosis not present

## 2022-01-28 LAB — BAYER DCA HB A1C WAIVED: HB A1C (BAYER DCA - WAIVED): 8.3 % — ABNORMAL HIGH (ref 4.8–5.6)

## 2022-01-28 NOTE — Telephone Encounter (Signed)
Last office visit 10/31/21 ?Upcoming appointment 01/30/22 ?Medication is on med list but does not show prescribed by our office before. ? ? ?

## 2022-01-29 LAB — CBC WITH DIFFERENTIAL/PLATELET
Basophils Absolute: 0.1 10*3/uL (ref 0.0–0.2)
Basos: 1 %
EOS (ABSOLUTE): 0.1 10*3/uL (ref 0.0–0.4)
Eos: 2 %
Hematocrit: 37.6 % (ref 34.0–46.6)
Hemoglobin: 12.8 g/dL (ref 11.1–15.9)
Immature Grans (Abs): 0 10*3/uL (ref 0.0–0.1)
Immature Granulocytes: 0 %
Lymphocytes Absolute: 2.6 10*3/uL (ref 0.7–3.1)
Lymphs: 42 %
MCH: 32.7 pg (ref 26.6–33.0)
MCHC: 34 g/dL (ref 31.5–35.7)
MCV: 96 fL (ref 79–97)
Monocytes Absolute: 0.6 10*3/uL (ref 0.1–0.9)
Monocytes: 10 %
Neutrophils Absolute: 2.8 10*3/uL (ref 1.4–7.0)
Neutrophils: 45 %
Platelets: 225 10*3/uL (ref 150–450)
RBC: 3.92 x10E6/uL (ref 3.77–5.28)
RDW: 12.5 % (ref 11.7–15.4)
WBC: 6.2 10*3/uL (ref 3.4–10.8)

## 2022-01-29 LAB — LIPID PANEL
Chol/HDL Ratio: 2.5 ratio (ref 0.0–4.4)
Cholesterol, Total: 162 mg/dL (ref 100–199)
HDL: 66 mg/dL (ref >39)
LDL Chol Calc (NIH): 71 mg/dL (ref 0–99)
Triglycerides: 146 mg/dL (ref 0–149)
VLDL Cholesterol Cal: 25 mg/dL (ref 5–40)

## 2022-01-29 LAB — MICROALBUMIN / CREATININE URINE RATIO
Creatinine, Urine: 67.5 mg/dL
Microalb/Creat Ratio: 26 mg/g creat (ref 0–29)
Microalbumin, Urine: 17.4 ug/mL

## 2022-01-29 LAB — CMP14+EGFR
ALT: 16 IU/L (ref 0–32)
AST: 23 IU/L (ref 0–40)
Albumin/Globulin Ratio: 2.3 — ABNORMAL HIGH (ref 1.2–2.2)
Albumin: 4.2 g/dL (ref 3.7–4.7)
Alkaline Phosphatase: 88 IU/L (ref 44–121)
BUN/Creatinine Ratio: 16 (ref 12–28)
BUN: 15 mg/dL (ref 8–27)
Bilirubin Total: 0.2 mg/dL (ref 0.0–1.2)
CO2: 21 mmol/L (ref 20–29)
Calcium: 9.2 mg/dL (ref 8.7–10.3)
Chloride: 105 mmol/L (ref 96–106)
Creatinine, Ser: 0.94 mg/dL (ref 0.57–1.00)
Globulin, Total: 1.8 g/dL (ref 1.5–4.5)
Glucose: 177 mg/dL — ABNORMAL HIGH (ref 70–99)
Potassium: 5 mmol/L (ref 3.5–5.2)
Sodium: 140 mmol/L (ref 134–144)
Total Protein: 6 g/dL (ref 6.0–8.5)
eGFR: 65 mL/min/{1.73_m2} (ref >59)

## 2022-01-30 ENCOUNTER — Encounter: Payer: Self-pay | Admitting: Family Medicine

## 2022-01-30 ENCOUNTER — Ambulatory Visit (INDEPENDENT_AMBULATORY_CARE_PROVIDER_SITE_OTHER): Payer: Medicare HMO | Admitting: Family Medicine

## 2022-01-30 VITALS — BP 158/75 | HR 92 | Ht 62.0 in | Wt 115.0 lb

## 2022-01-30 DIAGNOSIS — Z23 Encounter for immunization: Secondary | ICD-10-CM | POA: Diagnosis not present

## 2022-01-30 DIAGNOSIS — Z780 Asymptomatic menopausal state: Secondary | ICD-10-CM | POA: Diagnosis not present

## 2022-01-30 DIAGNOSIS — E785 Hyperlipidemia, unspecified: Secondary | ICD-10-CM

## 2022-01-30 DIAGNOSIS — E1169 Type 2 diabetes mellitus with other specified complication: Secondary | ICD-10-CM | POA: Diagnosis not present

## 2022-01-30 MED ORDER — SIMVASTATIN 40 MG PO TABS: 40.0000 mg | ORAL_TABLET | Freq: Every day | ORAL | 3 refills | Status: DC

## 2022-01-30 MED ORDER — FAMOTIDINE 20 MG PO TABS: 20.0000 mg | ORAL_TABLET | Freq: Every day | ORAL | 1 refills | Status: DC

## 2022-01-30 MED ORDER — GLIPIZIDE ER 5 MG PO TB24: 5.0000 mg | ORAL_TABLET | Freq: Every day | ORAL | 3 refills | Status: DC

## 2022-01-30 MED ORDER — METFORMIN HCL ER 500 MG PO TB24: 1000.0000 mg | ORAL_TABLET | Freq: Every day | ORAL | 3 refills | Status: DC
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
## 2022-02-09 ENCOUNTER — Telehealth: Payer: Self-pay | Admitting: Family Medicine

## 2022-02-09 NOTE — Telephone Encounter (Signed)
Patient calling to schedule DEXA scan. Please call back.  ?

## 2022-02-12 ENCOUNTER — Ambulatory Visit (INDEPENDENT_AMBULATORY_CARE_PROVIDER_SITE_OTHER): Payer: Medicare HMO

## 2022-02-12 ENCOUNTER — Telehealth: Payer: Self-pay | Admitting: Family Medicine

## 2022-02-12 DIAGNOSIS — M810 Age-related osteoporosis without current pathological fracture: Secondary | ICD-10-CM | POA: Diagnosis not present

## 2022-02-12 DIAGNOSIS — Z780 Asymptomatic menopausal state: Secondary | ICD-10-CM

## 2022-02-12 NOTE — Telephone Encounter (Signed)
Exam was not completed in epic - images have been sent canopy release to be read  ?

## 2022-02-16 ENCOUNTER — Ambulatory Visit: Payer: Medicare HMO

## 2022-02-23 ENCOUNTER — Ambulatory Visit (INDEPENDENT_AMBULATORY_CARE_PROVIDER_SITE_OTHER): Payer: Medicare HMO

## 2022-02-23 VITALS — Ht 62.0 in | Wt 115.0 lb

## 2022-02-23 DIAGNOSIS — Z1231 Encounter for screening mammogram for malignant neoplasm of breast: Secondary | ICD-10-CM

## 2022-02-23 DIAGNOSIS — Z0000 Encounter for general adult medical examination without abnormal findings: Secondary | ICD-10-CM | POA: Diagnosis not present

## 2022-02-23 NOTE — Patient Instructions (Signed)
Misty [name] , ?Thank you for taking time to come for your Medicare Wellness Visit. I appreciate your ongoing commitment to your health goals. Please review the following plan we discussed and let me know if I can assist you in the future.  ? ?Screening recommendations/referrals: ?Colonoscopy: Done 08/06/2017 Repeat in 5 years ? ?Mammogram: Done 05/19/2021. Repeat annually ? ?Bone Density: Done 02/12/2022 Repeat every 2 years ? ?Recommended yearly ophthalmology/optometry visit for glaucoma screening and checkup ?Recommended yearly dental visit for hygiene and checkup ? ?Vaccinations: ?Influenza vaccine: Done 08/04/2021 Repeat annually ? ?Pneumococcal vaccine: Done 10/05/2016 and 01/27/2018. ?Tdap vaccine: Done 01/30/2022 Repeat in 10 years ? ?Shingles vaccine: Done 01/30/2022, second dose due after 04/01/2022.   ?Covid-19:Done 11/18/2019, 12/09/2019, 09/06/2020, 05/23/2021 and 09/19/2021. ? ?Advanced directives: Please bring a copy of your health care power of attorney and living will to the office to be added to your chart at your convenience. ? ? ?Conditions/risks identified: Aim for 30 minutes of exercise or brisk walking, 6-8 glasses of water, and 5 servings of fruits and vegetables each day. ? ? ?Next appointment: Follow up in one year for your annual wellness visit 2024. ? ? ?Preventive Care 18 Years and Older, Female ?Preventive care refers to lifestyle choices and visits with your health care provider that can promote health and wellness. ?What does preventive care include? ?A yearly physical exam. This is also called an annual well check. ?Dental exams once or twice a year. ?Routine eye exams. Ask your health care provider how often you should have your eyes checked. ?Personal lifestyle choices, including: ?Daily care of your teeth and gums. ?Regular physical activity. ?Eating a healthy diet. ?Avoiding tobacco and drug use. ?Limiting alcohol use. ?Practicing safe sex. ?Taking low-dose aspirin every day. ?Taking vitamin  and mineral supplements as recommended by your health care provider. ?What happens during an annual well check? ?The services and screenings done by your health care provider during your annual well check will depend on your age, overall health, lifestyle risk factors, and family history of disease. ?Counseling  ?Your health care provider may ask you questions about your: ?Alcohol use. ?Tobacco use. ?Drug use. ?Emotional well-being. ?Home and relationship well-being. ?Sexual activity. ?Eating habits. ?History of falls. ?Memory and ability to understand (cognition). ?Work and work Statistician. ?Reproductive health. ?Screening  ?You may have the following tests or measurements: ?Height, weight, and BMI. ?Blood pressure. ?Lipid and cholesterol levels. These may be checked every 5 years, or more frequently if you are over 62 years old. ?Skin check. ?Lung cancer screening. You may have this screening every year starting at age 40 if you have a 30-pack-year history of smoking and currently smoke or have quit within the past 15 years. ?Fecal occult blood test (FOBT) of the stool. You may have this test every year starting at age 65. ?Flexible sigmoidoscopy or colonoscopy. You may have a sigmoidoscopy every 5 years or a colonoscopy every 10 years starting at age 67. ?Hepatitis C blood test. ?Hepatitis B blood test. ?Sexually transmitted disease (STD) testing. ?Diabetes screening. This is done by checking your blood sugar (glucose) after you have not eaten for a while (fasting). You may have this done every 1-3 years. ?Bone density scan. This is done to screen for osteoporosis. You may have this done starting at age 25. ?Mammogram. This may be done every 1-2 years. Talk to your health care provider about how often you should have regular mammograms. ?Talk with your health care provider about your test results, treatment  options, and if necessary, the need for more tests. ?Vaccines  ?Your health care provider may recommend  certain vaccines, such as: ?Influenza vaccine. This is recommended every year. ?Tetanus, diphtheria, and acellular pertussis (Tdap, Td) vaccine. You may need a Td booster every 10 years. ?Zoster vaccine. You may need this after age 21. ?Pneumococcal 13-valent conjugate (PCV13) vaccine. One dose is recommended after age 79. ?Pneumococcal polysaccharide (PPSV23) vaccine. One dose is recommended after age 77. ?Talk to your health care provider about which screenings and vaccines you need and how often you need them. ?This information is not intended to replace advice given to you by your health care provider. Make sure you discuss any questions you have with your health care provider. ?Document Released: 11/22/2015 Document Revised: 07/15/2016 Document Reviewed: 08/27/2015 ?Elsevier Interactive Patient Education ? 2017 [place]. ? ?Fall Prevention in the Home ?Falls can cause injuries. They can happen to people of all ages. There are many things you can do to make your home safe and to help prevent falls. ?What can I do on the outside of my home? ?Regularly fix the edges of walkways and driveways and fix any cracks. ?Remove anything that might make you trip as you walk through a door, such as a raised step or threshold. ?Trim any bushes or trees on the path to your home. ?Use bright outdoor lighting. ?Clear any walking paths of anything that might make someone trip, such as rocks or tools. ?Regularly check to see if handrails are loose or broken. Make sure that both sides of any steps have handrails. ?Any raised decks and porches should have guardrails on the edges. ?Have any leaves, snow, or ice cleared regularly. ?Use sand or salt on walking paths during winter. ?Clean up any spills in your garage right away. This includes oil or grease spills. ?What can I do in the bathroom? ?Use night lights. ?Install grab bars by the toilet and in the tub and shower. Do not use towel bars as grab bars. ?Use non-skid mats or  decals in the tub or shower. ?If you need to sit down in the shower, use a plastic, non-slip stool. ?Keep the floor dry. Clean up any water that spills on the floor as soon as it happens. ?Remove soap buildup in the tub or shower regularly. ?Attach bath mats securely with double-sided non-slip rug tape. ?Do not have throw rugs and other things on the floor that can make you trip. ?What can I do in the bedroom? ?Use night lights. ?Make sure that you have a light by your bed that is easy to reach. ?Do not use any sheets or blankets that are too big for your bed. They should not hang down onto the floor. ?Have a firm chair that has side arms. You can use this for support while you get dressed. ?Do not have throw rugs and other things on the floor that can make you trip. ?What can I do in the kitchen? ?Clean up any spills right away. ?Avoid walking on wet floors. ?Keep items that you use a lot in easy-to-reach places. ?If you need to reach something above you, use a strong step stool that has a grab bar. ?Keep electrical cords out of the way. ?Do not use floor polish or wax that makes floors slippery. If you must use wax, use non-skid floor wax. ?Do not have throw rugs and other things on the floor that can make you trip. ?What can I do with my stairs? ?Do not  leave any items on the stairs. ?Make sure that there are handrails on both sides of the stairs and use them. Fix handrails that are broken or loose. Make sure that handrails are as long as the stairways. ?Check any carpeting to make sure that it is firmly attached to the stairs. Fix any carpet that is loose or worn. ?Avoid having throw rugs at the top or bottom of the stairs. If you do have throw rugs, attach them to the floor with carpet tape. ?Make sure that you have a light switch at the top of the stairs and the bottom of the stairs. If you do not have them, ask someone to add them for you. ?What else can I do to help prevent falls? ?Wear shoes that: ?Do not  have high heels. ?Have rubber bottoms. ?Are comfortable and fit you well. ?Are closed at the toe. Do not wear sandals. ?If you use a stepladder: ?Make sure that it is fully opened. Do not climb a closed step

## 2022-02-23 NOTE — Progress Notes (Signed)
? ?Subjective:  ? Misty [name] is a 70 y.o. female who presents for Medicare Annual (Subsequent) preventive examination. ?Virtual Visit via Telephone Note ? ?I connected with  Misty [name] on 02/23/22 at  2:00 PM EDT by telephone and verified that I am speaking with the correct person using two identifiers. ? ?Location: ?Patient: HOME ?Provider: [facility] ?Persons participating in the virtual visit: patient/Nurse Health Advisor ?  ?I discussed the limitations, risks, security and privacy concerns of performing an evaluation and management service by telephone and the availability of in person appointments. The patient expressed understanding and agreed to proceed. ? ?Interactive audio and video telecommunications were attempted between this nurse and patient, however failed, due to patient having technical difficulties OR patient did not have access to video capability.  We continued and completed visit with audio only. ? ?Some vital signs may be absent or patient reported.  ? ?Chriss Driver, LPN ? ?Review of Systems    ? ?Cardiac Risk Factors include: diabetes mellitus;dyslipidemia;advanced age (>35mn, >>68women);sedentary lifestyle ? ?   ?Objective:  ?  ?Today's Vitals  ? 02/23/22 1404  ?Weight: 115 lb (52.2 kg)  ?Height: 5' 2" (1.575 m)  ? ?Body mass index is 21.03 kg/m?. ? ? ?  02/23/2022  ?  2:13 PM 02/13/2021  ? 10:07 AM 02/13/2020  ?  8:24 AM 12/01/2018  ?  2:36 PM 11/30/2017  ?  1:41 PM 08/06/2017  ?  8:34 AM  ?Advanced Directives  ?Does Patient Have a Medical Advance Directive? _0  Yes  ?Type of AParamedicof [place]rown[place]Living will HTillamookLiving will HLinnell CampLiving will HCentral PointLiving will Healthcare Power of Attorney Living will;Healthcare Power of Attorney  ?Does patient want to make changes to medical advance directive?   No - Patient declined No - Patient declined No - Patient declined   ?Copy of  [place] Chart? No - copy requested No - copy requested  No - copy requested No - copy requested No - copy requested  ? ? ?Current Medications (verified) ?Outpatient Encounter Medications as of 02/23/2022  ?Medication Sig  ? blood glucose meter kit and supplies KIT Dispense based on patient and insurance preference. Use up to four times daily as directed. (FOR ICD-9 250.00, 250.01).  ? calcium carbonate (OS-CAL) 1250 (500 Ca) MG chewable tablet Chew 1 tablet by mouth daily.  ? cholecalciferol (VITAMIN D3) 25 MCG (1000 UNIT) tablet Take 1,000 Units by mouth daily.  ? famotidine (PEPCID) 20 MG tablet Take 1 tablet (20 mg total) by mouth at bedtime.  ? glipiZIDE (GLUCOTROL XL) 5 MG 24 hr tablet Take 1 tablet (5 mg total) by mouth daily with breakfast.  ? glucose blood (ONETOUCH ULTRA) test strip USE 4 TIMES DAILY AS DRECTED DX: E11.9  ? Lancets (ONETOUCH ULTRASOFT) lancets USE TO TEST BLOOD SUGAR 4 TIMES A DAY AS DIRECTED  ? metFORMIN (GLUCOPHAGE-XR) 500 MG 24 hr tablet Take 2 tablets (1,000 mg total) by mouth at bedtime.  ? Multiple Vitamins-Minerals (VISION FORMULA 2 PO) Take 1 capsule by mouth at bedtime.  ? simvastatin (ZOCOR) 40 MG tablet Take 1 tablet (40 mg total) by mouth daily.  ? ?No facility-administered encounter medications on file as of 02/23/2022.  ? ? ?Allergies (verified) ?Atorvastatin  ? ?History: ?Past Medical History:  ?Diagnosis Date  ? Colon polyps   ? Diabetes mellitus without complication (HIsabela   ? Fractured  sternum   ? Hyperlipidemia   ? Seasonal allergies   ? Whiplash   ? ?Past Surgical History:  ?Procedure Laterality Date  ? CESAREAN SECTION    ? COLONOSCOPY    ? COLONOSCOPY N/A 08/06/2017  ? Procedure: COLONOSCOPY;  Surgeon: Danie Binder, MD;  [facility];  Service: Endoscopy;  Laterality: N/A;  9:30 Am  ? ?Family History  ?Problem Relation Age of Onset  ? Cancer Mother   ?     INTESTINAL  ? Colon cancer Mother   ? Stroke Father   ? Cancer Sister   ?      BREAST  ? Heart disease Brother   ? Heart disease Son   ? ?Social History  ? ?Socioeconomic History  ? Marital status: Married  ?  Spouse name: Not on file  ? Number of children: 3  ? Years of education: Not on file  ? Highest education level: Some college, no degree  ?Occupational History  ? Occupation: security guard  ?  Comment: [employer] works at retirement community  ?Tobacco Use  ? Smoking status: Never  ? Smokeless tobacco: Never  ?Vaping Use  ? Vaping Use: Never used  ?Substance and Sexual Activity  ? Alcohol use: No  ? Drug use: No  ? Sexual activity: Not on file  ?Other Topics Concern  ? Not on file  ?Social History Narrative  ? She still works full time; lives with husband 02/13/21  ? ?Social Determinants of Health  ? ?Financial Resource Strain: Low Risk   ? Difficulty of Paying Living Expenses: Not hard at all  ?Food Insecurity: No Food Insecurity  ? Worried About Charity fundraiser in the Last Year: Never true  ? Ran Out of Food in the Last Year: Never true  ?Transportation Needs: No Transportation Needs  ? Lack of Transportation (Medical): No  ? Lack of Transportation (Non-Medical): No  ?Physical Activity: Inactive  ? Days of Exercise per Week: 0 days  ? Minutes of Exercise per Session: 0 min  ?Stress: No Stress Concern Present  ? Feeling of Stress : Not at all  ?Social Connections: Moderately Isolated  ? Frequency of Communication with Friends and Family: More than three times a week  ? Frequency of Social Gatherings with Friends and Family: More than three times a week  ? Attends Religious Services: Never  ? Active Member of Clubs or Organizations: No  ? Attends Archivist Meetings: Never  ? Marital Status: Married  ? ? ?Tobacco Counseling ?Counseling given: Not Answered ? ? ?Clinical Intake: ? ?Pre-visit preparation completed: Yes ? ?Pain : No/denies pain ? ?  ? ?BMI - recorded: 21.03 ?Nutritional Status: BMI of 19-24  Normal ?Nutritional Risks: None ?Diabetes: No ? ?How often do you need to have  someone help you when you read instructions, pamphlets, or other written materials from your doctor or pharmacy?: 1 - Never ? ?Diabetic?Nutrition Risk Assessment: ? ?Has the patient had any N/V/D within the last 2 months?  No  ?Does the patient have any non-healing wounds?  No  ?Has the patient had any unintentional weight loss or weight gain?  No  ? ?Diabetes: ? ?Is the patient diabetic?  Yes  ?If diabetic, was a CBG obtained today?  No  ?Did the patient bring in their glucometer from home?  No  ?How often do you monitor your CBG's? daily.  ? ?Financial Strains and Diabetes Management: ? ?Are you having any financial strains with the  device, your supplies or your medication? Yes .  ?Does the patient want to be seen by Chronic Care Management for management of their diabetes?  No  ?Would the patient like to be referred to a Nutritionist or for Diabetic Management?  No  ? ?Diabetic Exams: ? ?Diabetic Eye Exam: Completed 10/2021.Marland Kitchen Pt has been advised about the importance in completing this exam.  ?Diabetic Foot Exam: Completed 10/31/2021. Pt has been advised about the importance in completing this exam.  ? ?Interpreter Needed?: No ? ?Information entered by :: mj , lpn ? ? ?Activities of Daily Living ? ?  02/23/2022  ?  2:15 PM  ?In your present state of health, do you have any difficulty performing the following activities:  ?Hearing? 0  ?Vision? 0  ?Difficulty concentrating or making decisions? 0  ?Walking or climbing stairs? 0  ?Dressing or bathing? 0  ?Doing errands, shopping? 0  ?Preparing Food and eating ? N  ?Using the Toilet? N  ?In the past six months, have you accidently leaked urine? N  ?Do you have problems with loss of bowel control? N  ?Managing your Medications? N  ?Managing your Finances? N  ?Housekeeping or managing your Housekeeping? N  ? ? ?Patient Care Team: ?Dettinger, Fransisca Kaufmann, MD as PCP - General (Family Medicine) ?Katha Cabal, LCSW as [place] Management  (Licensed Holiday representative) ? ?Indicate any recent Medical Services you may have received from other than [facility] providers in the past year (date may be approximate). ? ?   ?Assessment:  ? This is a routin

## 2022-03-11 ENCOUNTER — Inpatient Hospital Stay: Admission: RE | Admit: 2022-03-11 | Payer: PPO | Source: Ambulatory Visit

## 2022-03-12 ENCOUNTER — Other Ambulatory Visit: Payer: Self-pay | Admitting: Family Medicine

## 2022-03-12 DIAGNOSIS — E1169 Type 2 diabetes mellitus with other specified complication: Secondary | ICD-10-CM

## 2022-03-12 MED ORDER — ACCU-CHEK GUIDE VI STRP: ORAL_STRIP | 3 refills | Status: AC

## 2022-03-12 MED ORDER — ACCU-CHEK SOFTCLIX LANCETS MISC: 3 refills | Status: AC

## 2022-03-12 MED ORDER — ACCU-CHEK GUIDE W/DEVICE KIT: PACK | 0 refills | Status: AC

## 2022-05-01 ENCOUNTER — Other Ambulatory Visit: Payer: Medicare HMO

## 2022-05-01 DIAGNOSIS — E785 Hyperlipidemia, unspecified: Secondary | ICD-10-CM | POA: Diagnosis not present

## 2022-05-01 DIAGNOSIS — E1169 Type 2 diabetes mellitus with other specified complication: Secondary | ICD-10-CM | POA: Diagnosis not present

## 2022-05-04 ENCOUNTER — Ambulatory Visit: Payer: Medicare HMO | Admitting: Family Medicine

## 2022-05-05 ENCOUNTER — Other Ambulatory Visit: Payer: Medicare HMO

## 2022-05-05 DIAGNOSIS — E1169 Type 2 diabetes mellitus with other specified complication: Secondary | ICD-10-CM | POA: Diagnosis not present

## 2022-05-05 DIAGNOSIS — E785 Hyperlipidemia, unspecified: Secondary | ICD-10-CM | POA: Diagnosis not present

## 2022-05-05 LAB — CBC WITH DIFFERENTIAL/PLATELET
Basophils Absolute: 0 10*3/uL (ref 0.0–0.2)
Basos: 1 %
EOS (ABSOLUTE): 0.1 10*3/uL (ref 0.0–0.4)
Eos: 1 %
Hematocrit: 38.5 % (ref 34.0–46.6)
Hemoglobin: 12.8 g/dL (ref 11.1–15.9)
Immature Grans (Abs): 0 10*3/uL (ref 0.0–0.1)
Immature Granulocytes: 0 %
Lymphocytes Absolute: 2.6 10*3/uL (ref 0.7–3.1)
Lymphs: 40 %
MCH: 32.7 pg (ref 26.6–33.0)
MCHC: 33.2 g/dL (ref 31.5–35.7)
MCV: 98 fL — ABNORMAL HIGH (ref 79–97)
Monocytes Absolute: 0.8 10*3/uL (ref 0.1–0.9)
Monocytes: 12 %
Neutrophils Absolute: 3.1 10*3/uL (ref 1.4–7.0)
Neutrophils: 46 %
Platelets: 220 10*3/uL (ref 150–450)
RBC: 3.92 x10E6/uL (ref 3.77–5.28)
RDW: 12.6 % (ref 11.7–15.4)
WBC: 6.5 10*3/uL (ref 3.4–10.8)

## 2022-05-05 LAB — CMP14+EGFR
ALT: 22 IU/L (ref 0–32)
AST: 20 IU/L (ref 0–40)
Albumin/Globulin Ratio: 2.1 (ref 1.2–2.2)
Albumin: 4.1 g/dL (ref 3.7–4.7)
Alkaline Phosphatase: 92 IU/L (ref 44–121)
BUN/Creatinine Ratio: 15 (ref 12–28)
BUN: 15 mg/dL (ref 8–27)
Bilirubin Total: 0.2 mg/dL (ref 0.0–1.2)
CO2: 22 mmol/L (ref 20–29)
Calcium: 8.9 mg/dL (ref 8.7–10.3)
Chloride: 103 mmol/L (ref 96–106)
Creatinine, Ser: 1.03 mg/dL — ABNORMAL HIGH (ref 0.57–1.00)
Globulin, Total: 2 g/dL (ref 1.5–4.5)
Glucose: 205 mg/dL — ABNORMAL HIGH (ref 70–99)
Potassium: 4.8 mmol/L (ref 3.5–5.2)
Sodium: 140 mmol/L (ref 134–144)
Total Protein: 6.1 g/dL (ref 6.0–8.5)
eGFR: 58 mL/min/{1.73_m2} — ABNORMAL LOW (ref >59)

## 2022-05-05 LAB — LIPID PANEL
Chol/HDL Ratio: 2.4 ratio (ref 0.0–4.4)
Cholesterol, Total: 167 mg/dL (ref 100–199)
HDL: 71 mg/dL (ref >39)
LDL Chol Calc (NIH): 75 mg/dL (ref 0–99)
Triglycerides: 122 mg/dL (ref 0–149)
VLDL Cholesterol Cal: 21 mg/dL (ref 5–40)

## 2022-05-05 LAB — BAYER DCA HB A1C WAIVED: HB A1C (BAYER DCA - WAIVED): 8.2 % — ABNORMAL HIGH (ref 4.8–5.6)

## 2022-05-06 ENCOUNTER — Encounter: Payer: Self-pay | Admitting: Family Medicine

## 2022-05-06 ENCOUNTER — Ambulatory Visit (INDEPENDENT_AMBULATORY_CARE_PROVIDER_SITE_OTHER): Payer: Medicare HMO | Admitting: Family Medicine

## 2022-05-06 ENCOUNTER — Telehealth: Payer: Self-pay

## 2022-05-06 VITALS — BP 131/72 | HR 82 | Temp 97.9°F | Ht 62.0 in | Wt 117.0 lb

## 2022-05-06 DIAGNOSIS — E1169 Type 2 diabetes mellitus with other specified complication: Secondary | ICD-10-CM | POA: Diagnosis not present

## 2022-05-06 DIAGNOSIS — E785 Hyperlipidemia, unspecified: Secondary | ICD-10-CM | POA: Diagnosis not present

## 2022-05-06 MED ORDER — RYBELSUS 7 MG PO TABS: 7.0000 mg | ORAL_TABLET | Freq: Every day | ORAL | 3 refills | Status: DC

## 2022-05-06 MED ORDER — RYBELSUS 3 MG PO TABS: 3.0000 mg | ORAL_TABLET | Freq: Every day | ORAL | 0 refills | Status: DC

## 2022-05-06 NOTE — Chronic Care Management (AMB) (Unsigned)
  Chronic Care Management   Outreach Note  05/06/2022 Name: MICHELENE KENISTON MRN: [id number] DOB: [birth year]  Murlean Caller Morandi is a 71 y.o. year old female who is a primary care patient of Dettinger, Fransisca Kaufmann, MD. I reached out to [employer] by phone today in response to a referral sent by Ms. Murlean Caller Maring's primary care provider.  An unsuccessful telephone outreach was attempted today. The patient was referred to the case management team for assistance with care management and care coordination.   Follow Up Plan: A HIPAA compliant phone message was left for the patient providing contact information and requesting a return call.  The care management team will reach out to the patient again over the next 7 days.  If patient returns call to provider office, please advise to call [facility] * at [phone]*  Noreene Larsson, [place], [place] [facility]  [place], [place] [address] Direct Dial: [phone] [email or web address]

## 2022-05-06 NOTE — Progress Notes (Signed)
BP 131/72   Pulse 82   Temp 97.9 F (36.6 C)   Ht _0  (1.575 m)   Wt 117 lb (53.1 kg)   SpO2 98%   BMI 21.40 kg/m    Subjective:   Patient ID: Misty [name], female    DOB: [birth year], 71 y.o.   MRN: [id number]  HPI: Misty [name] is a 71 y.o. female presenting on 05/06/2022 for Medical Management of Chronic Issues   HPI Type 2 diabetes mellitus Patient comes in today for recheck of his diabetes. Patient has been currently taking glipizide, stop the metformin for. Patient is not currently on an ACE inhibitor/ARB. Patient has not seen an ophthalmologist this year. Patient denies any issues with their feet. The symptom started onset as an adult hyperlipidemia ARE RELATED TO DM   Hyperlipidemia Patient is coming in for recheck of his hyperlipidemia. The patient is currently taking simvastatin. They deny any issues with myalgias or history of liver damage from it. They deny any focal numbness or weakness or chest pain.   Relevant past medical, surgical, family and social history reviewed and updated as indicated. Interim medical history since our last visit reviewed. Allergies and medications reviewed and updated.  Review of Systems  Constitutional:  Negative for chills and fever.  Eyes:  Negative for visual disturbance.  Respiratory:  Negative for chest tightness and shortness of breath.   Cardiovascular:  Negative for chest pain and leg swelling.  Musculoskeletal:  Negative for back pain and gait problem.  Skin:  Negative for rash.  Neurological:  Negative for dizziness, light-headedness and headaches.  Psychiatric/Behavioral:  Negative for agitation and behavioral problems.   All other systems reviewed and are negative.   Per HPI unless specifically indicated above   Allergies as of 05/06/2022       Reactions   Atorvastatin Other (See Comments)   MUSCLE PAIN         Medication List        Accurate as of May 06, 2022 10:12 AM. If you have any  questions, ask your nurse or doctor.          STOP taking these medications    metFORMIN 500 MG 24 hr tablet Commonly known as: GLUCOPHAGE-XR Stopped by: Fransisca Kaufmann Tywanda Rice, MD       TAKE these medications    Accu-Chek Guide test strip Generic drug: glucose blood USE 4 TIMES DAILY AS DIRECTED DX: E11.9   Accu-Chek Guide w/Device Kit USE 4 TIMES DAILY AS DIRECTED DX: E11.9   Accu-Chek Softclix Lancets lancets USE 4 TIMES DAILY AS DIRECTED DX: E11.9   blood glucose meter kit and supplies Kit Dispense based on patient and insurance preference. Use up to four times daily as directed. (FOR ICD-9 250.00, 250.01).   calcium carbonate 1250 (500 Ca) MG chewable tablet Commonly known as: OS-CAL Chew 1 tablet by mouth daily.   cholecalciferol 25 MCG (1000 UNIT) tablet Commonly known as: VITAMIN D3 Take 1,000 Units by mouth daily.   famotidine 20 MG tablet Commonly known as: Pepcid Take 1 tablet (20 mg total) by mouth at bedtime.   glipiZIDE 5 MG 24 hr tablet Commonly known as: GLUCOTROL XL Take 1 tablet (5 mg total) by mouth daily with breakfast.   Rybelsus 3 MG Tabs Generic drug: Semaglutide Take 3 mg by mouth daily. Started by: Fransisca Kaufmann Garry Bochicchio, MD   Rybelsus 7 MG Tabs Generic drug: Semaglutide Take 7 mg by mouth daily. Started by: Fransisca Kaufmann  Amauria Younts, MD   simvastatin 40 MG tablet Commonly known as: ZOCOR Take 1 tablet (40 mg total) by mouth daily.   VISION FORMULA 2 PO Take 1 capsule by mouth at bedtime.         Objective:   BP 131/72   Pulse 82   Temp 97.9 F (36.6 C)   Ht _0  (1.575 m)   Wt 117 lb (53.1 kg)   SpO2 98%   BMI 21.40 kg/m   Wt Readings from Last 3 Encounters:  05/06/22 117 lb (53.1 kg)  02/23/22 115 lb (52.2 kg)  01/30/22 115 lb (52.2 kg)    Physical Exam Vitals and nursing note reviewed.  Constitutional:      General: She is not in acute distress.    Appearance: She is well-developed. She is not diaphoretic.  Eyes:      Conjunctiva/sclera: Conjunctivae normal.  Cardiovascular:     Rate and Rhythm: Normal rate and regular rhythm.     Heart sounds: Normal heart sounds. No murmur heard. Pulmonary:     Effort: Pulmonary effort is normal. No respiratory distress.     Breath sounds: Normal breath sounds. No wheezing.  Musculoskeletal:        General: No swelling or tenderness. Normal range of motion.  Skin:    General: Skin is warm and dry.     Findings: No rash.  Neurological:     Mental Status: She is alert and oriented to person, place, and time.     Coordination: Coordination normal.  Psychiatric:        Behavior: Behavior normal.     Results for orders placed or performed in visit on 05/01/22  CBC with Differential/Platelet  Result Value Ref Range   WBC 6.5 3.4 - 10.8 x10E3/uL   RBC 3.92 3.77 - 5.28 x10E6/uL   Hemoglobin 12.8 11.1 - 15.9 g/dL   Hematocrit 38.5 34.0 - 46.6 %   MCV 98 (H) 79 - 97 fL   MCH 32.7 26.6 - 33.0 pg   MCHC 33.2 31.5 - 35.7 g/dL   RDW 12.6 11.7 - 15.4 %   Platelets 220 150 - 450 x10E3/uL   Neutrophils 46 Not Estab. %   Lymphs 40 Not Estab. %   Monocytes 12 Not Estab. %   Eos 1 Not Estab. %   Basos 1 Not Estab. %   Neutrophils Absolute 3.1 1.4 - 7.0 x10E3/uL   Lymphocytes Absolute 2.6 0.7 - 3.1 x10E3/uL   Monocytes Absolute 0.8 0.1 - 0.9 x10E3/uL   EOS (ABSOLUTE) 0.1 0.0 - 0.4 x10E3/uL   Basophils Absolute 0.0 0.0 - 0.2 x10E3/uL   Immature Granulocytes 0 Not Estab. %   Immature Grans (Abs) 0.0 0.0 - 0.1 x10E3/uL  CMP14+EGFR  Result Value Ref Range   Glucose 205 (H) 70 - 99 mg/dL   BUN 15 8 - 27 mg/dL   Creatinine, Ser 1.03 (H) 0.57 - 1.00 mg/dL   eGFR 58 (L) >59 mL/min/1.73   BUN/Creatinine Ratio 15 12 - 28   Sodium 140 134 - 144 mmol/L   Potassium 4.8 3.5 - 5.2 mmol/L   Chloride 103 96 - 106 mmol/L   CO2 22 20 - 29 mmol/L   Calcium 8.9 8.7 - 10.3 mg/dL   Total Protein 6.1 6.0 - 8.5 g/dL   Albumin 4.1 3.7 - 4.7 g/dL   Globulin, Total 2.0 1.5 - 4.5  g/dL   Albumin/Globulin Ratio 2.1 1.2 - 2.2   Bilirubin Total 0.2 0.0 -  1.2 mg/dL   Alkaline Phosphatase 92 44 - 121 IU/L   AST 20 0 - 40 IU/L   ALT 22 0 - 32 IU/L  Lipid panel  Result Value Ref Range   Cholesterol, Total 167 100 - 199 mg/dL   Triglycerides 122 0 - 149 mg/dL   HDL 71 >39 mg/dL   VLDL Cholesterol Cal 21 5 - 40 mg/dL   LDL Chol Calc (NIH) 75 0 - 99 mg/dL   Chol/HDL Ratio 2.4 0.0 - 4.4 ratio  Bayer DCA Hb A1c Waived  Result Value Ref Range   HB A1C (BAYER DCA - WAIVED) 8.2 (H) 4.8 - 5.6 %    Assessment & Plan:   Problem List Items Addressed This Visit       Endocrine   Type 2 diabetes mellitus (HCC) - Primary   Relevant Medications   Semaglutide (RYBELSUS) 3 MG TABS   Semaglutide (RYBELSUS) 7 MG TABS   Other Relevant Orders   AMB Referral to Community Care Coordinaton   Hyperlipidemia associated with type 2 diabetes mellitus (HCC)   Relevant Medications   Semaglutide (RYBELSUS) 3 MG TABS   Semaglutide (RYBELSUS) 7 MG TABS   Other Relevant Orders   AMB Referral to [place]  Patient's A1c is up today still at 8.2.  She says she stopped the metformin because she was worried about losing at home because of metformin "she heard about this from a family member"  Otherwise patient seems to be doing well.  We will recheck the rest of her blood work and start Rybelsus for her Follow up plan: Return in about 3 months (around 08/06/2022), or if symptoms worsen or fail to improve, for Diabetes recheck.  Counseling provided for all of the vaccine components Orders Placed This Encounter  Procedures   AMB Referral to O[place]e Nickalus Thornsberry, MD [facility] 05/06/2022, 10:12 AM

## 2022-05-07 NOTE — Chronic Care Management (AMB) (Signed)
  Chronic Care Management   Note  05/07/2022 Name: Misty [name] MRN: [id number] DOB: 07/17/1951  Misty [name] is a 71 y.o. year old female who is a primary care patient of Dettinger, Fransisca Kaufmann, MD. I reached out to [employer] by phone today in response to a referral sent by Misty [name]'s PCP.  Misty [name] was given information about Chronic Care Management services today including:  CCM service includes personalized support from designated clinical staff supervised by her physician, including individualized plan of care and coordination with other care providers 24/7 contact phone numbers for assistance for urgent and routine care needs. Service will only be billed when office clinical staff spend 20 minutes or more in a month to coordinate care. Only one practitioner may furnish and bill the service in a calendar month. The patient may stop CCM services at any time (effective at the end of the month) by phone call to the office staff. The patient is responsible for co-pay (up to 20% after annual deductible is met) if co-pay is required by the individual health plan.   Patient agreed to services and verbal consent obtained.   Follow up plan: Telephone appointment with care management team member scheduled for:05/29/2022  Noreene Larsson, [place], [place], [place] [address] Direct Dial: [phone] [name] Website: [email or web address]

## 2022-05-08 DIAGNOSIS — E119 Type 2 diabetes mellitus without complications: Secondary | ICD-10-CM | POA: Diagnosis not present

## 2022-05-08 DIAGNOSIS — E785 Hyperlipidemia, unspecified: Secondary | ICD-10-CM | POA: Diagnosis not present

## 2022-05-29 ENCOUNTER — Ambulatory Visit: Payer: Medicare HMO | Admitting: Pharmacist

## 2022-05-29 DIAGNOSIS — E1169 Type 2 diabetes mellitus with other specified complication: Secondary | ICD-10-CM

## 2022-06-01 ENCOUNTER — Ambulatory Visit
Admission: RE | Admit: 2022-06-01 | Discharge: 2022-06-01 | Disposition: A | Discharge status: Home / Self Care | Payer: Medicare HMO | Source: Ambulatory Visit | Attending: Family Medicine | Admitting: Family Medicine

## 2022-06-01 DIAGNOSIS — Z1231 Encounter for screening mammogram for malignant neoplasm of breast: Secondary | ICD-10-CM

## 2022-06-02 NOTE — Progress Notes (Signed)
   PHARMD MEDICATION MANAGEMENT    05/29/2022 Name: Misty [name] MRN: [id number] DOB: [birth year]  Referred by: Dettinger, Fransisca Kaufmann, MD Reason for referral : Chronic Care Management and Diabetes   An unsuccessful telephone outreach was attempted today. The patient was referred to the case management team for assistance with care management and care coordination.   Follow Up Plan: The care management team will reach out to the patient again over the next 5-7 BUSINESS days.     Regina Eck, PharmD, BCPS Clinical Pharmacist, [place]  II Phone [phone]

## 2022-06-22 DIAGNOSIS — L210 Seborrhea capitis: Secondary | ICD-10-CM | POA: Diagnosis not present

## 2022-06-22 DIAGNOSIS — L299 Pruritus, unspecified: Secondary | ICD-10-CM | POA: Diagnosis not present

## 2022-08-03 ENCOUNTER — Other Ambulatory Visit: Payer: Medicare HMO

## 2022-08-03 DIAGNOSIS — E1169 Type 2 diabetes mellitus with other specified complication: Secondary | ICD-10-CM | POA: Diagnosis not present

## 2022-08-03 LAB — BAYER DCA HB A1C WAIVED: HB A1C (BAYER DCA - WAIVED): 12.9 % — ABNORMAL HIGH (ref 4.8–5.6)

## 2022-08-07 ENCOUNTER — Ambulatory Visit (INDEPENDENT_AMBULATORY_CARE_PROVIDER_SITE_OTHER): Payer: Medicare HMO | Admitting: Family Medicine

## 2022-08-07 ENCOUNTER — Encounter: Payer: Self-pay | Admitting: Family Medicine

## 2022-08-07 ENCOUNTER — Telehealth: Payer: Self-pay

## 2022-08-07 VITALS — BP 114/74 | HR 86 | Temp 97.5°F | Ht 62.0 in | Wt 116.0 lb

## 2022-08-07 DIAGNOSIS — Z23 Encounter for immunization: Secondary | ICD-10-CM

## 2022-08-07 DIAGNOSIS — E785 Hyperlipidemia, unspecified: Secondary | ICD-10-CM

## 2022-08-07 DIAGNOSIS — E1169 Type 2 diabetes mellitus with other specified complication: Secondary | ICD-10-CM | POA: Diagnosis not present

## 2022-08-07 MED ORDER — FAMOTIDINE 20 MG PO TABS: 20.0000 mg | ORAL_TABLET | Freq: Every day | ORAL | 1 refills | Status: AC

## 2022-08-07 NOTE — Chronic Care Management (AMB) (Signed)
  Care Coordination Note  08/07/2022 Name: Misty [name] MRN: [id number] DOB: 04/08/1951  Misty [name] is a 71 y.o. year old female who is a primary care patient of Dettinger, Fransisca Kaufmann, MD and is actively engaged with the care management team. I reached out to [employer] by phone today to assist with re-scheduling a follow up visit with the Pharmacist  Follow up plan: Unsuccessful telephone outreach attempt made. A HIPAA compliant phone message was left for the patient providing contact information and requesting a return call.  The care management team will reach out to the patient again over the next 7 days.  If patient returns call to provider office, please advise to call [facility]  at [place], [place], [place] [address] Direct Dial: [phone] [name]

## 2022-08-07 NOTE — Chronic Care Management (AMB) (Signed)
  Chronic Care Management   Note  08/07/2022 Name: LILYANA LIPPMAN MRN: [id number] DOB: [birth year]  Murlean Caller Weld is a 71 y.o. year old female who is a primary care patient of Dettinger, Fransisca Kaufmann, MD. I reached out to [employer] by phone today in response to a referral sent by Ms. Murlean Caller Andis's PCP.  Ms. Mignano was given information about Chronic Care Management services today including:  CCM service includes personalized support from designated clinical staff supervised by her physician, including individualized plan of care and coordination with other care providers 24/7 contact phone numbers for assistance for urgent and routine care needs. Service will only be billed when office clinical staff spend 20 minutes or more in a month to coordinate care. Only one practitioner may furnish and bill the service in a calendar month. The patient may stop CCM services at any time (effective at the end of the month) by phone call to the office staff. The patient is responsible for co-pay (up to 20% after annual deductible is met) if co-pay is required by the individual health plan.   Patient agreed to services and verbal consent obtained.   Follow up plan: Telephone appointment with care management team member scheduled for:09/04/2022  Noreene Larsson, [place], [place] [address] Direct Dial: [phone] [email or web address]

## 2022-08-07 NOTE — Progress Notes (Signed)
BP 114/74   Pulse 86   Temp (!) 97.5 F (36.4 C)   Ht _0  (1.575 m)   Wt 116 lb (52.6 kg)   SpO2 97%   BMI 21.22 kg/m    Subjective:   Patient ID: Misty [name], female    DOB: 1950-12-17, 71 y.o.   MRN: [id number]  HPI: Misty [name] is a 71 y.o. female presenting on 08/07/2022 for Medical Management of Chronic Issues and Diabetes   HPI Type 2 diabetes mellitus Patient comes in today for recheck of his diabetes. Patient has been currently taking glipizide, stop the metformin because she felt like it was causing her neuropathy.  She never got the Rybelsus because of cost, will give samples today and refer back to [facility].. Patient is not currently on an ACE inhibitor/ARB. Patient has not seen an ophthalmologist this year. Patient denies any issues with their feet. The symptom started onset as an adult hyperlipidemia ARE RELATED TO DM.  She is possibly starting to get some neuropathy in her toes and her feet.  Hyperlipidemia Patient is coming in for recheck of his hyperlipidemia. The patient is currently taking simvastatin. They deny any issues with myalgias or history of liver damage from it. They deny any focal numbness or weakness or chest pain.   Relevant past medical, surgical, family and social history reviewed and updated as indicated. Interim medical history since our last visit reviewed. Allergies and medications reviewed and updated.  Review of Systems  Constitutional:  Negative for chills and fever.  Eyes:  Negative for visual disturbance.  Respiratory:  Negative for chest tightness and shortness of breath.   Cardiovascular:  Negative for chest pain and leg swelling.  Musculoskeletal:  Negative for back pain and gait problem.  Skin:  Negative for rash.  Neurological:  Positive for numbness. Negative for light-headedness and headaches.  Psychiatric/Behavioral:  Negative for agitation and behavioral problems.   All other systems reviewed and are  negative.   Per HPI unless specifically indicated above   Allergies as of 08/07/2022       Reactions   Atorvastatin Other (See Comments)   MUSCLE PAIN         Medication List        Accurate as of August 07, 2022  8:57 AM. If you have any questions, ask your nurse or doctor.          Accu-Chek Guide test strip Generic drug: glucose blood USE 4 TIMES DAILY AS DIRECTED DX: E11.9   Accu-Chek Guide w/Device Kit USE 4 TIMES DAILY AS DIRECTED DX: E11.9   Accu-Chek Softclix Lancets lancets USE 4 TIMES DAILY AS DIRECTED DX: E11.9   blood glucose meter kit and supplies Kit Dispense based on patient and insurance preference. Use up to four times daily as directed. (FOR ICD-9 250.00, 250.01).   calcium carbonate 1250 (500 Ca) MG chewable tablet Commonly known as: OS-CAL Chew 1 tablet by mouth daily.   cholecalciferol 25 MCG (1000 UNIT) tablet Commonly known as: VITAMIN D3 Take 1,000 Units by mouth daily.   famotidine 20 MG tablet Commonly known as: Pepcid Take 1 tablet (20 mg total) by mouth at bedtime.   glipiZIDE 5 MG 24 hr tablet Commonly known as: GLUCOTROL XL Take 1 tablet (5 mg total) by mouth daily with breakfast.   Rybelsus 3 MG Tabs Generic drug: Semaglutide Take 3 mg by mouth daily.   Rybelsus 7 MG Tabs Generic drug: Semaglutide Take 7 mg by mouth  daily.   simvastatin 40 MG tablet Commonly known as: ZOCOR Take 1 tablet (40 mg total) by mouth daily.   VISION FORMULA 2 PO Take 1 capsule by mouth at bedtime.         Objective:   BP 114/74   Pulse 86   Temp (!) 97.5 F (36.4 C)   Ht _0  (1.575 m)   Wt 116 lb (52.6 kg)   SpO2 97%   BMI 21.22 kg/m   Wt Readings from Last 3 Encounters:  08/07/22 116 lb (52.6 kg)  05/06/22 117 lb (53.1 kg)  02/23/22 115 lb (52.2 kg)    Physical Exam Vitals and nursing note reviewed.  Constitutional:      General: She is not in acute distress.    Appearance: She is well-developed. She is not  diaphoretic.  Eyes:     Conjunctiva/sclera: Conjunctivae normal.  Cardiovascular:     Rate and Rhythm: Normal rate and regular rhythm.     Heart sounds: Normal heart sounds. No murmur heard. Pulmonary:     Effort: Pulmonary effort is normal. No respiratory distress.     Breath sounds: Normal breath sounds. No wheezing.  Musculoskeletal:        General: No swelling or tenderness. Normal range of motion.  Skin:    General: Skin is warm and dry.     Findings: No rash.  Neurological:     Mental Status: She is alert and oriented to person, place, and time.     Coordination: Coordination normal.  Psychiatric:        Behavior: Behavior normal.     Results for orders placed or performed in visit on 08/03/22  Bayer DCA Hb A1c Waived  Result Value Ref Range   HB A1C (BAYER DCA - WAIVED) 12.9 (H) 4.8 - 5.6 %    Assessment & Plan:   Problem List Items Addressed This Visit       Endocrine   Type 2 diabetes mellitus ([place]) - Primary   Relevant Orders   AMB Referral to Chronic Care Management Services   Hyperlipidemia associated with type 2 diabetes mellitus ([place])   Relevant Orders   AMB Referral to Chronic Care Management Services   Other Visit Diagnoses     Need for immunization against influenza       Relevant Orders   Flu Vaccine QUAD High Dose(Fluad) (Completed)     Gave samples for Rybelsus, continue glipizide.  A1c is much worse at 12.9.  Follow up plan: Return in about 3 months (around 11/06/2022), or if symptoms worsen or fail to improve, for Diabetes recheck.  Counseling provided for all of the vaccine components Orders Placed This Encounter  Procedures   Flu Vaccine QUAD High Dose(Fluad)   AMB Referral to Chronic Care Management Services    Caryl Pina, MD [facility] 08/07/2022, 8:57 AM

## 2022-08-27 ENCOUNTER — Telehealth: Payer: Self-pay | Admitting: *Deleted

## 2022-08-27 NOTE — Telephone Encounter (Signed)
   CCM RN Visit Note   '@DATE'$ @ Name: Misty [name] MRN: [id number]      DOB: [birth year]  Subjective: Misty [name] is a 71 y.o. year old female who is a primary care patient of Caryl Pina MD. The patient was referred to the Chronic Care Management team for assistance with care management needs subsequent to provider initiation of CCM services and plan of care.       An unsuccessful telephone outreach was attempted today to contact the patient about Chronic Care Management needs.    Plan:Telephone follow up appointment with care management team member scheduled for:  upon care guide rescheduling.  Jacqlyn Larsen RNC, BSN RN Case Manager [place] [phone]

## 2022-09-02 ENCOUNTER — Other Ambulatory Visit: Payer: Self-pay | Admitting: Family Medicine

## 2022-09-02 DIAGNOSIS — E1169 Type 2 diabetes mellitus with other specified complication: Secondary | ICD-10-CM

## 2022-09-02 DIAGNOSIS — E785 Hyperlipidemia, unspecified: Secondary | ICD-10-CM

## 2022-09-02 NOTE — Progress Notes (Signed)
Placed new referral to CCM

## 2022-09-04 ENCOUNTER — Ambulatory Visit (INDEPENDENT_AMBULATORY_CARE_PROVIDER_SITE_OTHER): Payer: Medicare HMO | Admitting: Pharmacist

## 2022-09-04 DIAGNOSIS — E1169 Type 2 diabetes mellitus with other specified complication: Secondary | ICD-10-CM

## 2022-09-04 MED ORDER — GLIPIZIDE ER 5 MG PO TB24: 5.0000 mg | ORAL_TABLET | Freq: Two times a day (BID) | ORAL | 3 refills | Status: DC

## 2022-09-04 NOTE — Progress Notes (Signed)
Chronic Care Management Pharmacy Note  10/272023 Name:  Misty [name] MRN:  [id number] DOB:  1951/01/19  Summary:  Diabetes: New goal. Uncontrolled; current treatment:GLIPIZIDE;  INTOLERANCES: Rybelsus (reports N/V) T2DM diagnosed approx.15 yrs ago Current GFR 58, Current A1c 12.9% Glyxambi samples given Discussed the likelihood of insulin or a form of injectable Continue glipizie/increase Current glucose readings: fasting glucose: 200s, post prandial glucose: >200-300 reports hyperglycemic symptoms Discussed meal planning options and Plate method for healthy eating Avoid sugary drinks and desserts Incorporate balanced protein, non starchy veggies, 1 serving of carbohydrate with each meal Increase water intake Increase physical activity as able Current exercise: encouraged/active at work Lipid Panel --almost at goal LDL<70 Will continue to address; ENCOURAGED STATIN COMPLIANCE, REVIEWED DIET/LIFESTYLE    Component Value Date/Time   CHOL 167 05/05/2022 0810   TRIG 122 05/05/2022 0810   HDL 71 05/05/2022 0810   CHOLHDL 2.4 05/05/2022 0810   LDLCALC 75 05/05/2022 0810   LABVLDL 21 05/05/2022 0810     Patient Goals/Self-Care Activities patient will:  - take medications as prescribed as evidenced by patient report and record review check glucose fasting daily or if symptomatic, document, and provide at future appointments collaborate with provider on medication access solutions target a minimum of 150 minutes of moderate intensity exercise weekly engage in dietary modifications by FOLLOWING A HEART HEALTHY DIET/HEALTHY PLATE METHOD   Subjective: Misty [name] is an 71 y.o. year old female who is a primary patient of Dettinger, Fransisca Kaufmann, MD.  The patient was referred to the Chronic Care Management team for assistance with care management needs subsequent to provider initiation of CCM services and plan of care.    Engaged with patient face to face for initial  visit in response to provider referral for CCM services.   Objective:  LABS:    Lab Results  Component Value Date   CREATININE 1.03 (H) 05/05/2022   CREATININE 0.94 01/28/2022   CREATININE 0.98 10/30/2021     Lab Results  Component Value Date   HGBA1C 12.9 (H) 08/03/2022         Component Value Date/Time   CHOL 167 05/05/2022 0810   TRIG 122 05/05/2022 0810   HDL 71 05/05/2022 0810   CHOLHDL 2.4 05/05/2022 0810   LDLCALC 75 05/05/2022 0810     Clinical ASCVD: No   The 10-year ASCVD risk score (Arnett DK, et al., 2019) is: 16.5%   Values used to calculate the score:     Age: 34 years     Sex: Female     Is Non-Hispanic African American: No     Diabetic: Yes     Tobacco smoker: No     Systolic Blood Pressure: 030 mmHg     Is BP treated: No     HDL Cholesterol: 71 mg/dL     Total Cholesterol: 167 mg/dL    Other: (CHADS2VASc if Afib, PHQ9 if depression, MMRC or CAT for COPD, ACT, DEXA)    BP Readings from Last 3 Encounters:  08/07/22 114/74  05/06/22 131/72  01/30/22 (!) 158/75      SDOH:  (Social Determinants of Health) assessments and interventions performed:    Allergies  Allergen Reactions   Atorvastatin Other (See Comments)    MUSCLE PAIN    Metformin And Related Other (See Comments)    Legs numb   Semaglutide Nausea Only    rybelsus    Medications Reviewed Today     Reviewed by Lavera Guise,  [place] (Pharmacist) on 09/17/22 at [place]  Med List Status: <None>   Medication Order Taking? Sig Documenting Provider Last Dose Status Informant  Accu-Chek Softclix Lancets lancets [id number]  USE 4 TIMES DAILY AS DIRECTED DX: E11.9 Dettinger, Fransisca Kaufmann, MD  Active   blood glucose meter kit and supplies KIT [id number]  Dispense based on patient and insurance preference. Use up to four times daily as directed. (FOR ICD-9 250.00, 250.01). Dettinger, Fransisca Kaufmann, MD  Active   Blood Glucose Monitoring Suppl (ACCU-CHEK GUIDE) w/Device KIT [id number]  USE 4 TIMES DAILY AS  DIRECTED DX: E11.9 Dettinger, Fransisca Kaufmann, MD  Active   calcium carbonate (OS-CAL) 1250 (500 Ca) MG chewable tablet [id number]  Chew 1 tablet by mouth daily. [provider]  Active   cholecalciferol (VITAMIN D3) 25 MCG (1000 UNIT) tablet [id number]  Take 1,000 Units by mouth daily. [provider]  Active   famotidine (PEPCID) 20 MG tablet [id number]  Take 1 tablet (20 mg total) by mouth at bedtime.  Patient not taking: Reported on 09/08/2022   Dettinger, Fransisca Kaufmann, MD  Active   glipiZIDE (GLUCOTROL XL) 5 MG 24 hr tablet [id number]  Take 1 tablet (5 mg total) by mouth 2 (two) times daily with a meal. Dettinger, Fransisca Kaufmann, MD  Active   glucose blood (ACCU-CHEK GUIDE) test strip [id number]  USE 4 TIMES DAILY AS DIRECTED DX: E11.9 Dettinger, Fransisca Kaufmann, MD  Active   Multiple Vitamins-Minerals (VISION FORMULA 2 PO) [id number]  Take 1 capsule by mouth at bedtime. [provider]  Active Self  simvastatin (ZOCOR) 40 MG tablet [id number]  Take 1 tablet (40 mg total) by mouth daily. Dettinger, Fransisca Kaufmann, MD  Active               Goals Addressed               This Visit's Progress     Patient Stated     T2DM, HLD, CKD (pt-stated)        Current Barriers:  Unable to independently afford treatment regimen Unable to achieve control of T2DM, HLD, CKD  Suboptimal therapeutic regimen for T2DM, HLD, CKD  Interventions: 1:1 collaboration with Dettinger, Fransisca Kaufmann, MD regarding development and update of comprehensive plan of care as evidenced by provider attestation and co-signature Inter-disciplinary care team collaboration (see longitudinal plan of care) Comprehensive medication review performed; medication list updated in electronic medical record  Diabetes: New goal. Uncontrolled; current treatment:GLIPIZIDE;  INTOLERANCES: Rybelsus (reports N/V) T2DM diagnosed approx.15 yrs ago Current GFR 58, Current A1c 12.9% Glyxambi samples given Discussed the likelihood of insulin or a  form of injectable Continue glipizie/increase Current glucose readings: fasting glucose: 200s, post prandial glucose: >200-300 reports hyperglycemic symptoms Discussed meal planning options and Plate method for healthy eating Avoid sugary drinks and desserts Incorporate balanced protein, non starchy veggies, 1 serving of carbohydrate with each meal Increase water intake Increase physical activity as able Current exercise: encouraged/active at work Lipid Panel --almost at goal LDL<70 Will continue to address; ENCOURAGED STATIN COMPLIANCE, REVIEWED DIET/LIFESTYLE    Component Value Date/Time   CHOL 167 05/05/2022 0810   TRIG 122 05/05/2022 0810   HDL 71 05/05/2022 0810   CHOLHDL 2.4 05/05/2022 0810   LDLCALC 75 05/05/2022 0810   LABVLDL 21 05/05/2022 0810    Patient Goals/Self-Care Activities patient will:  - take medications as prescribed as evidenced by patient report and record review check glucose fasting daily or if symptomatic, document, and provide at future appointments  collaborate with provider on medication access solutions target a minimum of 150 minutes of moderate intensity exercise weekly engage in dietary modifications by FOLLOWING A HEART HEALTHY DIET/HEALTHY PLATE METHOD         Plan: Telephone follow up appointment with care management team member scheduled for:  1 MONTH      Regina Eck, PharmD, BCPS Clinical Pharmacist, [place]  [place] Phone [phone]

## 2022-09-08 ENCOUNTER — Ambulatory Visit: Payer: Self-pay | Admitting: *Deleted

## 2022-09-08 ENCOUNTER — Encounter: Payer: Self-pay | Admitting: *Deleted

## 2022-09-08 DIAGNOSIS — E785 Hyperlipidemia, unspecified: Secondary | ICD-10-CM

## 2022-09-08 DIAGNOSIS — E1169 Type 2 diabetes mellitus with other specified complication: Secondary | ICD-10-CM

## 2022-09-08 NOTE — Patient Instructions (Signed)
Please call the care guide team at [phone] if you need to cancel or reschedule your appointment.   If you are experiencing a Mental Health or [place] or need someone to talk to, please call the Suicide and Crisis Lifeline: 988 call the [place] National Suicide Prevention Lifeline: 813-515-0991 or TTY: 980-472-8121 TTY (925)487-5936) to talk to a trained counselor call 1-800-273-TALK (toll free, 24 hour hotline) go to [facility] [address], [place] [phone]) call the [place]: [phone] call 911   Following is a copy of your full provider care plan:   Goals Addressed             This Visit's Progress    CCM (DIABETES) EXPECTED OUTCOME:  MONITOR, SELF-MANAGE AND REDUCE SYMPTOMS OF DIABETES       Current Barriers:  Knowledge Deficits related to Diabetes Chronic Disease Management support and education needs related to Diabetes and diet Patient reports she lives with her spouse, is independent in all aspects of her care, works full time M-F 3 pm - 11 pm, pt reports she needs to work on her diet, states she eats too many starchy foods such as potatoes, states fasting ranges for CBG usually around 200, random ranges 160's, AIC is 12.9, pt is currently working with [facility] pharmacist.  Planned Interventions: Provided education to patient about basic DM disease process; Reviewed medications with patient and discussed importance of medication adherence;        Reviewed prescribed diet with patient carbohydrate modified; Discussed plans with patient for ongoing care management follow up and provided patient with direct contact information for care management team;      Provided patient with written educational materials related to hypo and hyperglycemia and importance of correct treatment;       Review of patient status, including review of consultants reports, relevant laboratory and other test results, and  medications completed;       Screening for signs and symptoms of depression related to chronic disease state;        Assessed social determinant of health barriers;        Education sent via my chart- hypoglycemia  Symptom Management: Take medications as prescribed   Attend all scheduled provider appointments Call pharmacy for medication refills 3-7 days in advance of running out of medications Attend church or other social activities Perform all self care activities independently  Perform IADL's (shopping, preparing meals, housekeeping, managing finances) independently Call provider office for new concerns or questions  check blood sugar at prescribed times: twice daily check feet daily for cuts, sores or redness enter blood sugar readings and medication or insulin into daily log take the blood sugar log to all doctor visits take the blood sugar meter to all doctor visits fill half of plate with vegetables limit fast food meals to no more than 1 per week manage portion size read food labels for fat, fiber, carbohydrates and portion size Be mindful of carbohydrate intake at each meal- too much rice, bread, pasta, potatoes will elevate your blood sugar Continue exercising 3 x per week, keep up the good work  Follow Up Plan: Telephone follow up appointment with care management team member scheduled for: 10/07/22 at 945 am       CCM (HYPERLIPIDEMIA) EXPECTED OUTCOME: MONITOR, SELF-MANAGE AND REDUCE SYMPTOMS OF HYPERLIPIDEMIA       Current Barriers:  Knowledge Deficits related to Hyperlipidemia Chronic Disease Management support and education needs related to Hyperlipidemia and  diet Patient reports she would like to work on her diet, she exercises 3 x per week.  Planned Interventions: Provider established cholesterol goals reviewed; Counseled on importance of regular laboratory monitoring as prescribed; Provided HLD educational materials; Reviewed importance of limiting foods high  in cholesterol; Reviewed exercise goals and target of 150 minutes per week; Screening for signs and symptoms of depression related to chronic disease state;  Assessed social determinant of health barriers;  Reviewed importance of following heart healthy diet, and avoiding trans/ saturated fats  Symptom Management: Take medications as prescribed   Attend all scheduled provider appointments Call pharmacy for medication refills 3-7 days in advance of running out of medications Attend church or other social activities Perform all self care activities independently  Perform IADL's (shopping, preparing meals, housekeeping, managing finances) independently Call provider office for new concerns or questions  - call for medicine refill 2 or 3 days before it runs out - take all medications exactly as prescribed - call doctor with any symptoms you believe are related to your medicine - call doctor when you experience any new symptoms - go to all doctor appointments as scheduled - adhere to prescribed diet: heart healthy Follow a heart healthy diet- avoid trans/ saturated fats (chips, cookies, pre-packaged foods)  Follow Up Plan: Telephone follow up appointment with care management team member scheduled for: 10/07/22 at 945 am          Patient verbalizes understanding of instructions and care plan provided today and agrees to view in [place]. Active MyChart status and patient understanding of how to access instructions and care plan via MyChart confirmed with patient.     Telephone follow up appointment with care management team member scheduled for:  10/07/22 at 945 am  Heart-Healthy Eating Plan Eating a healthy diet is important for the health of your heart. A heart-healthy eating plan includes: Eating less unhealthy fats. Eating more healthy fats. Eating less salt in your food. Salt is also called sodium. Making other changes in your diet. Talk with your doctor or a diet specialist  (dietitian) to create an eating plan that is right for you. What is my plan? Your doctor may recommend an eating plan that includes: Total fat: ______% or less of total calories a day. Saturated fat: ______% or less of total calories a day. Cholesterol: less than _________mg a day. Sodium: less than _________mg a day. What are tips for following this plan? Cooking Avoid frying your food. Try to bake, boil, grill, or broil it instead. You can also reduce fat by: Removing the skin from poultry. Removing all visible fats from meats. Steaming vegetables in water or broth. Meal planning  At meals, divide your plate into four equal parts: Fill one-half of your plate with vegetables and green salads. Fill one-fourth of your plate with whole grains. Fill one-fourth of your plate with lean protein foods. Eat 2-4 cups of vegetables per day. One cup of vegetables is: 1 cup (91 g) broccoli or cauliflower florets. 2 medium carrots. 1 large bell pepper. 1 large sweet potato. 1 large tomato. 1 medium white potato. 2 cups (150 g) raw leafy greens. Eat 1-2 cups of fruit per day. One cup of fruit is: 1 small apple 1 large banana 1 cup (237 g) mixed fruit, 1 large orange,  cup (82 g) dried fruit, 1 cup (240 mL) 100% fruit juice. Eat more foods that have soluble fiber. These are apples, broccoli, carrots, beans, peas, and barley. Try to get 20-30  g of fiber per day. Eat 4-5 servings of nuts, legumes, and seeds per week: 1 serving of dried beans or legumes equals  cup (90 g) cooked. 1 serving of nuts is  oz (12 almonds, 24 pistachios, or 7 walnut halves). 1 serving of seeds equals  oz (8 g). General information Eat more home-cooked food. Eat less restaurant, buffet, and fast food. Limit or avoid alcohol. Limit foods that are high in starch and sugar. Avoid fried foods. Lose weight if you are overweight. Keep track of how much salt (sodium) you eat. This is important if you have high  blood pressure. Ask your doctor to tell you more about this. Try to add vegetarian meals each week. Fats Choose healthy fats. These include olive oil and canola oil, flaxseeds, walnuts, almonds, and seeds. Eat more omega-3 fats. These include salmon, mackerel, sardines, tuna, flaxseed oil, and ground flaxseeds. Try to eat fish at least 2 times each week. Check food labels. Avoid foods with trans fats or high amounts of saturated fat. Limit saturated fats. These are often found in animal products, such as meats, butter, and cream. These are also found in plant foods, such as palm oil, palm kernel oil, and coconut oil. Avoid foods with partially hydrogenated oils in them. These have trans fats. Examples are stick margarine, some tub margarines, cookies, crackers, and other baked goods. What foods should I eat? Fruits All fresh, canned (in natural juice), or frozen fruits. Vegetables Fresh or frozen vegetables (raw, steamed, roasted, or grilled). Green salads. Grains Most grains. Choose whole wheat and whole grains most of the time. Rice and pasta, including brown rice and pastas made with whole wheat. Meats and other proteins Lean, well-trimmed beef, veal, pork, and lamb. Chicken and [place] without skin. All fish and shellfish. Wild duck, rabbit, pheasant, and venison. Egg whites or low-cholesterol egg substitutes. Dried beans, peas, lentils, and tofu. Seeds and most nuts. Dairy Low-fat or nonfat cheeses, including ricotta and mozzarella. Skim or 1% milk that is liquid, powdered, or evaporated. Buttermilk that is made with low-fat milk. Nonfat or low-fat yogurt. Fats and oils Non-hydrogenated (trans-free) margarines. Vegetable oils, including soybean, sesame, sunflower, olive, peanut, safflower, corn, canola, and cottonseed. Salad dressings or mayonnaise made with a vegetable oil. Beverages Mineral water. Coffee and tea. Diet carbonated beverages. Sweets and desserts Sherbet, gelatin, and  fruit ice. Small amounts of dark chocolate. Limit all sweets and desserts. Seasonings and condiments All seasonings and condiments. The items listed above may not be a complete list of foods and drinks you can eat. Contact a dietitian for more options. What foods should I avoid? Fruits Canned fruit in heavy syrup. Fruit in cream or butter sauce. Fried fruit. Limit coconut. Vegetables Vegetables cooked in cheese, cream, or butter sauce. Fried vegetables. Grains Breads that are made with saturated or trans fats, oils, or whole milk. Croissants. Sweet rolls. Donuts. High-fat crackers, such as cheese crackers. Meats and other proteins Fatty meats, such as hot dogs, ribs, sausage, bacon, rib-eye roast or steak. High-fat deli meats, such as salami and bologna. Caviar. Domestic duck and goose. Organ meats, such as liver. Dairy Cream, sour cream, cream cheese, and creamed cottage cheese. Whole-milk cheeses. Whole or 2% milk that is liquid, evaporated, or condensed. Whole buttermilk. Cream sauce or high-fat cheese sauce. Yogurt that is made from whole milk. Fats and oils Meat fat, or shortening. Cocoa butter, hydrogenated oils, palm oil, coconut oil, palm kernel oil. Solid fats and shortenings, including bacon fat, salt pork,  lard, and butter. Nondairy cream substitutes. Salad dressings with cheese or sour cream. Beverages Regular sodas and juice drinks with added sugar. Sweets and desserts Frosting. Pudding. Cookies. Cakes. Pies. Milk chocolate or white chocolate. Buttered syrups. Full-fat ice cream or ice cream drinks. The items listed above may not be a complete list of foods and drinks to avoid. Contact a dietitian for more information. Summary Heart-healthy meal planning includes eating less unhealthy fats, eating more healthy fats, and making other changes in your diet. Eat a balanced diet. This includes fruits and vegetables, low-fat or nonfat dairy, lean protein, nuts and legumes, whole  grains, and heart-healthy oils and fats. This information is not intended to replace advice given to you by your health care provider. Make sure you discuss any questions you have with your health care provider. Document Revised: 12/01/2021 Document Reviewed: 12/01/2021 Elsevier Patient Education  [place]. Hypoglycemia Hypoglycemia occurs when the level of sugar (glucose) in the blood is too low. Hypoglycemia can happen in people who have or do not have diabetes. It can develop quickly, and it can be a medical emergency. For most people, a blood glucose level below 70 mg/dL (3.9 mmol/L) is considered hypoglycemia. Glucose is a type of sugar that provides the body's main source of energy. Certain hormones (insulin and glucagon) control the level of glucose in the blood. Insulin lowers blood glucose, and glucagon raises blood glucose. Hypoglycemia can result from having too much insulin in the bloodstream, or from not eating enough food that contains glucose. You may also have reactive hypoglycemia, which happens within 4 hours after eating a meal. What are the causes? Hypoglycemia occurs most often in people who have diabetes and may be caused by: Diabetes medicine. Not eating enough, or not eating often enough. Increased physical activity. Drinking alcohol on an empty stomach. If you do not have diabetes, hypoglycemia may be caused by: A tumor in the pancreas. Not eating enough, or not eating for long periods at a time (fasting). A severe infection or illness. Problems after having bariatric surgery. Organ failure, such as kidney or liver failure. Certain medicines. What increases the risk? Hypoglycemia is more likely to develop in people who: Have diabetes and take medicines to lower blood glucose. Abuse alcohol. Have a severe illness. What are the signs or symptoms? Symptoms vary depending on whether the condition is mild, moderate, or severe. Mild  hypoglycemia Hunger. Sweating and feeling clammy. Dizziness or feeling light-headed. Sleepiness or restless sleep. Nausea. Increased heart rate. Headache. Blurry vision. Mood changes, such as irritability or anxiety. Tingling or numbness around the mouth, lips, or tongue. Moderate hypoglycemia Confusion and poor judgment. Behavior changes. Weakness. Irregular heartbeat. A change in coordination. Severe hypoglycemia Severe hypoglycemia is a medical emergency. It can cause: Fainting. Seizures. Loss of consciousness (coma). Death. How is this diagnosed? Hypoglycemia is diagnosed with a blood test to measure your blood glucose level. This blood test is done while you are having symptoms. Your health care provider may also do a physical exam and review your medical history. How is this treated? This condition can be treated by immediately eating or drinking something that contains sugar with 15 grams of fast-acting carbohydrate, such as: 4 oz (120 mL) of fruit juice. 4 oz (120 mL) of regular soda (not diet soda). Several pieces of hard candy. Check food labels to find out how many pieces to eat for 15 grams. 1 Tbsp (15 mL) of sugar or honey. 4 glucose tablets. 1 tube of  glucose gel. Treating hypoglycemia if you have diabetes If you are alert and able to swallow safely, follow the 15:15 rule: Take 15 grams of a fast-acting carbohydrate. Talk with your health care provider about how much you should take. Options for getting 15 grams of fast-acting carbohydrate include: Glucose tablets (take 4 tablets). Several pieces of hard candy. Check food labels to find out how many pieces to eat for 15 grams. 4 oz (120 mL) of fruit juice. 4 oz (120 mL) of regular soda (not diet soda). 1 Tbsp (15 mL) of sugar or honey. 1 tube of glucose gel. Check your blood glucose 15 minutes after you take the carbohydrate. If the repeat blood glucose level is still at or below 70 mg/dL (3.9 mmol/L), take 15  grams of a carbohydrate again. If your blood glucose level does not increase above 70 mg/dL (3.9 mmol/L) after 3 tries, seek emergency medical care. After your blood glucose level returns to normal, eat a meal or a snack within 1 hour.  Treating severe hypoglycemia Severe hypoglycemia is when your blood glucose level is below 54 mg/dL (3 mmol/L). Severe hypoglycemia is a medical emergency. Get medical help right away. If you have severe hypoglycemia and you cannot eat or drink, you will need to be given glucagon. A family member or close friend should learn how to check your blood glucose and how to give you glucagon. Ask your health care provider if you need to have an emergency glucagon kit available. Severe hypoglycemia may need to be treated in a hospital. The treatment may include getting glucose through an IV. You may also need treatment for the cause of your hypoglycemia. Follow these instructions at home:  General instructions Take over-the-counter and prescription medicines only as told by your health care provider. Monitor your blood glucose as told by your health care provider. If you drink alcohol: Limit how much you have to: 0-1 drink a day for women who are not pregnant. 0-2 drinks a day for men. Know how much alcohol is in your drink. In the [place] one drink equals one 12 oz bottle of beer (355 mL), one 5 oz glass of wine (148 mL), or one 1 oz glass of hard liquor (44 mL). Be sure to eat food along with drinking alcohol. Be aware that alcohol is absorbed quickly and may have lingering effects that may result in hypoglycemia later. Be sure to do ongoing glucose monitoring. Keep all follow-up visits. This is important. If you have diabetes: Always have a fast-acting carbohydrate (15 grams) option with you to treat low blood glucose. Follow your diabetes management plan as directed by your health care provider. Make sure you: Know the symptoms of hypoglycemia. It is important to  treat it right away to prevent it from becoming severe. Check your blood glucose as often as told. Always check before and after exercise. Always check your blood glucose before you drive a motorized vehicle. Take your medicines as told. Follow your meal plan. Eat on time, and do not skip meals. Share your diabetes management plan with people in your workplace, school, and household. Carry a medical alert card or wear medical alert jewelry. Where to find more information American Diabetes Association: www.diabetes.org Contact a health care provider if: You have problems keeping your blood glucose in your target range. You have frequent episodes of hypoglycemia. Get help right away if: You continue to have hypoglycemia symptoms after eating or drinking something that contains 15 grams of fast-acting carbohydrate, and  you cannot get your blood glucose above 70 mg/dL (3.9 mmol/L) while following the 15:15 rule. Your blood glucose is below 54 mg/dL (3 mmol/L). You have a seizure. You faint. These symptoms may represent a serious problem that is an emergency. Do not wait to see if the symptoms will go away. Get medical help right away. Call your local emergency services (911 in the [place] Do not drive yourself to the hospital. Summary Hypoglycemia occurs when the level of sugar (glucose) in the blood is too low. Hypoglycemia can happen in people who have or do not have diabetes. It can develop quickly, and it can be a medical emergency. Make sure you know the symptoms of hypoglycemia and how to treat it. Always have a fast-acting carbohydrate option with you to treat low blood sugar. This information is not intended to replace advice given to you by your health care provider. Make sure you discuss any questions you have with your health care provider. Document Revised: 09/26/2020 Document Reviewed: 09/26/2020 Elsevier Patient Education  [place].

## 2022-09-08 NOTE — Chronic Care Management (AMB) (Signed)
Chronic Care Management Provider Comprehensive Care Plan    09/08/2022 Name: Misty [name] MRN: [id number] DOB: 10/10/1951  Referral to Chronic Care Management (CCM) services was placed by Provider:  Caryl Pina on Date: 09/02/22.  Chronic Condition 1: Diabetes Provider Assessment and Plan   Type 2 diabetes mellitus ([place]) - Primary     Relevant Orders    AMB Referral to Chronic Care Management Services    Expected Outcome/Goals Addressed This Visit (Provider CCM goals/Provider Assessment and plan   Symptom Management Condition 1: Take medications as prescribed   Attend all scheduled provider appointments Call pharmacy for medication refills 3-7 days in advance of running out of medications Attend church or other social activities Perform all self care activities independently  Perform IADL's (shopping, preparing meals, housekeeping, managing finances) independently Call provider office for new concerns or questions  check blood sugar at prescribed times: twice daily check feet daily for cuts, sores or redness enter blood sugar readings and medication or insulin into daily log take the blood sugar log to all doctor visits take the blood sugar meter to all doctor visits fill half of plate with vegetables limit fast food meals to no more than 1 per week manage portion size read food labels for fat, fiber, carbohydrates and portion size Be mindful of carbohydrate intake at each meal- too much rice, bread, pasta, potatoes will elevate your blood sugar Continue exercising 3 x per week, keep up the good work  Chronic Condition 2: Hyperlipidemia Provider Assessment and Plan  Hyperlipidemia associated with type 2 diabetes mellitus ([place])     Relevant Orders    AMB Referral to Chronic Care Management Services     Expected Outcome/Goals Addressed This Visit (Provider CCM goals/Provider Assessment and plan   Symptom Management Condition 2: Take medications as prescribed    Attend all scheduled provider appointments Call pharmacy for medication refills 3-7 days in advance of running out of medications Attend church or other social activities Perform all self care activities independently  Perform IADL's (shopping, preparing meals, housekeeping, managing finances) independently Call provider office for new concerns or questions  call for medicine refill 2 or 3 days before it runs out take all medications exactly as prescribed call doctor with any symptoms you believe are related to your medicine call doctor when you experience any new symptoms go to all doctor appointments as scheduled adhere to prescribed diet: heart healthy Follow a heart healthy diet- avoid trans/ saturated fats (chips, cookies, pre-packaged foods)  Problem List Patient Active Problem List   Diagnosis Date Noted   Adenoma of ascending colon    Hyperlipidemia associated with type 2 diabetes mellitus ([place]) 03/05/2016   Type 2 diabetes mellitus ([place]) 08/22/2015    Medication Management  Current Outpatient Medications:    Accu-Chek Softclix Lancets lancets, USE 4 TIMES DAILY AS DIRECTED DX: E11.9, Disp: 400 each, Rfl: 3   blood glucose meter kit and supplies KIT, Dispense based on patient and insurance preference. Use up to four times daily as directed. (FOR ICD-9 250.00, 250.01)., Disp: 1 each, Rfl: 0   Blood Glucose Monitoring Suppl (ACCU-CHEK GUIDE) w/Device KIT, USE 4 TIMES DAILY AS DIRECTED DX: E11.9, Disp: 1 kit, Rfl: 0   calcium carbonate (OS-CAL) 1250 (500 Ca) MG chewable tablet, Chew 1 tablet by mouth daily., Disp: , Rfl:    cholecalciferol (VITAMIN D3) 25 MCG (1000 UNIT) tablet, Take 1,000 Units by mouth daily., Disp: , Rfl:    glipiZIDE (GLUCOTROL XL) 5 MG  24 hr tablet, Take 1 tablet (5 mg total) by mouth 2 (two) times daily with a meal., Disp: 180 tablet, Rfl: 3   glucose blood (ACCU-CHEK GUIDE) test strip, USE 4 TIMES DAILY AS DIRECTED DX: E11.9, Disp: 400 each, Rfl: 3    Multiple Vitamins-Minerals (VISION FORMULA 2 PO), Take 1 capsule by mouth at bedtime., Disp: , Rfl:    simvastatin (ZOCOR) 40 MG tablet, Take 1 tablet (40 mg total) by mouth daily., Disp: 90 tablet, Rfl: 3   famotidine (PEPCID) 20 MG tablet, Take 1 tablet (20 mg total) by mouth at bedtime. (Patient not taking: Reported on 09/08/2022), Disp: 90 tablet, Rfl: 1  Cognitive Assessment Identity Confirmed: : Name; DOB Cognitive Status: Normal   Functional Assessment Hearing Difficulty or Deaf: no Wear Glasses or Blind: yes Vision Management: can see well w/ glasses Concentrating, Remembering or Making Decisions Difficulty (CP): no Difficulty Communicating: no Difficulty Eating/Swallowing: no Walking or Climbing Stairs Difficulty: no Dressing/Bathing Difficulty: no Doing Errands Independently Difficulty (such as shopping) (CP): no   Caregiver Assessment  Primary Source of Support/Comfort: spouse Name of Support/Comfort Primary Source: spouse People in Home: spouse Name(s) of People in Home: spouse   Planned Interventions  Provided education to patient about basic DM disease process; Reviewed medications with patient and discussed importance of medication adherence;        Reviewed prescribed diet with patient carbohydrate modified; Discussed plans with patient for ongoing care management follow up and provided patient with direct contact information for care management team;      Provided patient with written educational materials related to hypo and hyperglycemia and importance of correct treatment;       Review of patient status, including review of consultants reports, relevant laboratory and other test results, and medications completed;       Screening for signs and symptoms of depression related to chronic disease state;        Assessed social determinant of health barriers;        Education sent via my chart- hypoglycemia Provider established cholesterol goals  reviewed; Counseled on importance of regular laboratory monitoring as prescribed; Provided HLD educational materials; Reviewed importance of limiting foods high in cholesterol; Reviewed exercise goals and target of 150 minutes per week; Screening for signs and symptoms of depression related to chronic disease state;  Assessed social determinant of health barriers;  Reviewed importance of following heart healthy diet, and avoiding trans/ saturated fats  Interaction and coordination with outside resources, practitioners, and providers See CCM Referral  Care Plan: Available in MyChart

## 2022-09-08 NOTE — Chronic Care Management (AMB) (Signed)
Chronic Care Management   CCM RN Visit Note  09/08/2022 Name: Misty [name] MRN: [id number] DOB: Nov 06, 1951  Subjective: Misty [name] is a 71 y.o. year old female who is a primary care patient of Dettinger, Fransisca Kaufmann, MD. The patient was referred to the Chronic Care Management team for assistance with care management needs subsequent to provider initiation of CCM services and plan of care.    Today's Visit:  Engaged with patient by telephone for initial visit.     SDOH Interventions Today    Flowsheet Row Most Recent Value  SDOH Interventions   Food Insecurity Interventions Intervention Not Indicated  Housing Interventions Intervention Not Indicated  Transportation Interventions Intervention Not Indicated  Utilities Interventions Intervention Not Indicated  Financial Strain Interventions Intervention Not Indicated  Physical Activity Interventions Intervention Not Indicated  Stress Interventions Intervention Not Indicated  Social Connections Interventions Intervention Not Indicated, Patient Refused         Goals Addressed             This Visit's Progress    CCM (DIABETES) EXPECTED OUTCOME:  MONITOR, SELF-MANAGE AND REDUCE SYMPTOMS OF DIABETES       Current Barriers:  Knowledge Deficits related to Diabetes Chronic Disease Management support and education needs related to Diabetes and diet Patient reports she lives with her spouse, is independent in all aspects of her care, works full time M-F 3 pm - 11 pm, pt reports she needs to work on her diet, states she eats too many starchy foods such as potatoes, states fasting ranges for CBG usually around 200, random ranges 160's, AIC is 12.9, pt is currently working with [facility] pharmacist.  Planned Interventions: Provided education to patient about basic DM disease process; Reviewed medications with patient and discussed importance of medication adherence;        Reviewed prescribed diet with patient carbohydrate  modified; Discussed plans with patient for ongoing care management follow up and provided patient with direct contact information for care management team;      Provided patient with written educational materials related to hypo and hyperglycemia and importance of correct treatment;       Review of patient status, including review of consultants reports, relevant laboratory and other test results, and medications completed;       Screening for signs and symptoms of depression related to chronic disease state;        Assessed social determinant of health barriers;        Education sent via my chart- hypoglycemia  Symptom Management: Take medications as prescribed   Attend all scheduled provider appointments Call pharmacy for medication refills 3-7 days in advance of running out of medications Attend church or other social activities Perform all self care activities independently  Perform IADL's (shopping, preparing meals, housekeeping, managing finances) independently Call provider office for new concerns or questions  check blood sugar at prescribed times: twice daily check feet daily for cuts, sores or redness enter blood sugar readings and medication or insulin into daily log take the blood sugar log to all doctor visits take the blood sugar meter to all doctor visits fill half of plate with vegetables limit fast food meals to no more than 1 per week manage portion size read food labels for fat, fiber, carbohydrates and portion size Be mindful of carbohydrate intake at each meal- too much rice, bread, pasta, potatoes will elevate your blood sugar Continue exercising 3 x per week, keep up the good work  Follow Up  Plan: Telephone follow up appointment with care management team member scheduled for: 10/07/22 at 945 am       CCM (HYPERLIPIDEMIA) EXPECTED OUTCOME: MONITOR, SELF-MANAGE AND REDUCE SYMPTOMS OF HYPERLIPIDEMIA       Current Barriers:  Knowledge Deficits related to  Hyperlipidemia Chronic Disease Management support and education needs related to Hyperlipidemia and diet Patient reports she would like to work on her diet, she exercises 3 x per week.  Planned Interventions: Provider established cholesterol goals reviewed; Counseled on importance of regular laboratory monitoring as prescribed; Provided HLD educational materials; Reviewed importance of limiting foods high in cholesterol; Reviewed exercise goals and target of 150 minutes per week; Screening for signs and symptoms of depression related to chronic disease state;  Assessed social determinant of health barriers;  Reviewed importance of following heart healthy diet, and avoiding trans/ saturated fats  Symptom Management: Take medications as prescribed   Attend all scheduled provider appointments Call pharmacy for medication refills 3-7 days in advance of running out of medications Attend church or other social activities Perform all self care activities independently  Perform IADL's (shopping, preparing meals, housekeeping, managing finances) independently Call provider office for new concerns or questions  - call for medicine refill 2 or 3 days before it runs out - take all medications exactly as prescribed - call doctor with any symptoms you believe are related to your medicine - call doctor when you experience any new symptoms - go to all doctor appointments as scheduled - adhere to prescribed diet: heart healthy Follow a heart healthy diet- avoid trans/ saturated fats (chips, cookies, pre-packaged foods)  Follow Up Plan: Telephone follow up appointment with care management team member scheduled for: 10/07/22 at 945 am          Plan:Telephone follow up appointment with care management team member scheduled for:  10/07/22 at [place] am  Jacqlyn Larsen [facility], BSN RN Case Manager [place] [phone]

## 2022-09-17 NOTE — Patient Instructions (Signed)
Visit Information  Following are the goals we discussed today:  Current Barriers:  Unable to independently afford treatment regimen Unable to achieve control of T2DM, HLD, CKD  Suboptimal therapeutic regimen for T2DM, HLD, CKD  Interventions: 1:1 collaboration with Dettinger, Fransisca Kaufmann, MD regarding development and update of comprehensive plan of care as evidenced by provider attestation and co-signature Inter-disciplinary care team collaboration (see longitudinal plan of care) Comprehensive medication review performed; medication list updated in electronic medical record  Diabetes: New goal. Uncontrolled; current treatment:GLIPIZIDE;  INTOLERANCES: Rybelsus (reports N/V) T2DM diagnosed approx.15 yrs ago Current GFR 58, Current A1c 12.9% Glyxambi samples given Discussed the likelihood of insulin or a form of injectable Continue glipizie/increase Current glucose readings: fasting glucose: 200s, post prandial glucose: >200-300 reports hyperglycemic symptoms Discussed meal planning options and Plate method for healthy eating Avoid sugary drinks and desserts Incorporate balanced protein, non starchy veggies, 1 serving of carbohydrate with each meal Increase water intake Increase physical activity as able Current exercise: encouraged/active at work Lipid Panel --almost at goal LDL<70 Will continue to address; ENCOURAGED STATIN COMPLIANCE, REVIEWED DIET/LIFESTYLE    Component Value Date/Time   CHOL 167 05/05/2022 0810   TRIG 122 05/05/2022 0810   HDL 71 05/05/2022 0810   CHOLHDL 2.4 05/05/2022 0810   LDLCALC 75 05/05/2022 0810   LABVLDL 21 05/05/2022 0810     Patient Goals/Self-Care Activities patient will:  - take medications as prescribed as evidenced by patient report and record review check glucose fasting daily or if symptomatic, document, and provide at future appointments collaborate with provider on medication access solutions target a minimum of 150 minutes of  moderate intensity exercise weekly engage in dietary modifications by FOLLOWING A HEART HEALTHY DIET/HEALTHY PLATE METHOD   Plan: Telephone follow up appointment with care management team member scheduled for:  1 month  Signature Regina Eck, PharmD, BCPS Clinical Pharmacist, [place]  [place] Phone [phone]   Please call the care guide team at [phone] if you need to cancel or reschedule your appointment.   The patient verbalized understanding of instructions, educational materials, and care plan provided today and DECLINED offer to receive copy of patient instructions, educational materials, and care plan.

## 2022-09-24 ENCOUNTER — Telehealth: Payer: Self-pay | Admitting: Family Medicine

## 2022-09-24 ENCOUNTER — Ambulatory Visit (INDEPENDENT_AMBULATORY_CARE_PROVIDER_SITE_OTHER): Payer: Medicare HMO | Admitting: Pharmacist

## 2022-09-24 ENCOUNTER — Encounter: Payer: Self-pay | Admitting: Pharmacist

## 2022-09-24 VITALS — BP 125/72

## 2022-09-24 DIAGNOSIS — M62830 Muscle spasm of back: Secondary | ICD-10-CM | POA: Diagnosis not present

## 2022-09-24 DIAGNOSIS — M9901 Segmental and somatic dysfunction of cervical region: Secondary | ICD-10-CM | POA: Diagnosis not present

## 2022-09-24 DIAGNOSIS — E1169 Type 2 diabetes mellitus with other specified complication: Secondary | ICD-10-CM

## 2022-09-24 DIAGNOSIS — M9902 Segmental and somatic dysfunction of thoracic region: Secondary | ICD-10-CM | POA: Diagnosis not present

## 2022-09-24 DIAGNOSIS — M9903 Segmental and somatic dysfunction of lumbar region: Secondary | ICD-10-CM | POA: Diagnosis not present

## 2022-09-24 NOTE — Progress Notes (Cosign Needed Addendum)
Chronic Care Management Pharmacy Note  09/24/2022 Name:  Misty [name] MRN:  [id number] DOB:  Oct 26, 1951  Summary:  Diabetes: Goal on track: NO. Uncontrolled; current treatment:GLIPIZIDE;  INTOLERANCES: Rybelsus (reports N/V), glyxambi (states frequent urination/wetting/bleeding, metformin (all updated on med list) T2DM diagnosed approx.15 yrs ago Current GFR 58-CKD 3a, Current A1c 12.9% Patient reports intolerance to Glyxambi samples (frequent urination, GI issues, bleeding/urine--has now cleared up) she does not wish to take Discussed the likelihood of insulin or a form of injectable at follow up; patient would like to wait to start medication in 2 weeks; discussed the risk of delaying medication  Continue glipizide  Current glucose readings: fasting glucose: 200s? States her FBG was 134 this AM--will follow up to see if correlating?, post prandial glucose: >200-300 reports hyperglycemic symptoms Discussed meal planning options and Plate method for healthy eating Avoid sugary drinks and desserts Incorporate balanced protein, non starchy veggies, 1 serving of carbohydrate with each meal Increase water intake Increase physical activity as able Current exercise: encouraged/active at work  Hyperlipidemia Lipid Panel --almost at goal LDL<70 Will continue to address; ENCOURAGED STATIN COMPLIANCE, REVIEWED DIET/LIFESTYLE    Component Value Date/Time   CHOL 167 05/05/2022 0810   TRIG 122 05/05/2022 0810   HDL 71 05/05/2022 0810   CHOLHDL 2.4 05/05/2022 0810   LDLCALC 75 05/05/2022 0810   LABVLDL 21 05/05/2022 0810   Hypertension  -patient as goal <130/80 -continue current regiment and compliance with diet/lifestyle   Patient Goals/Self-Care Activities patient will:  - take medications as prescribed as evidenced by patient report and record review check glucose fasting daily or if symptomatic, document, and provide at future appointments collaborate with provider on  medication access solutions target a minimum of 150 minutes of moderate intensity exercise weekly engage in dietary modifications by FOLLOWING A HEART HEALTHY DIET/HEALTHY PLATE METHOD   Subjective: Misty [name] is an 71 y.o. year old female who is a primary patient of Dettinger, Fransisca Kaufmann, MD.  The patient was referred to the Chronic Care Management team for assistance with care management needs subsequent to provider initiation of CCM services and plan of care.    Engaged with patient by telephone for follow up visit in response to provider referral for CCM services.   Objective:  LABS:    Lab Results  Component Value Date   CREATININE 1.03 (H) 05/05/2022   CREATININE 0.94 01/28/2022   CREATININE 0.98 10/30/2021     Lab Results  Component Value Date   HGBA1C 12.9 (H) 08/03/2022         Component Value Date/Time   CHOL 167 05/05/2022 0810   TRIG 122 05/05/2022 0810   HDL 71 05/05/2022 0810   CHOLHDL 2.4 05/05/2022 0810   LDLCALC 75 05/05/2022 0810     Clinical ASCVD: No   The 10-year ASCVD risk score (Arnett DK, et al., 2019) is: 19.4%   Values used to calculate the score:     Age: 71 years     Sex: Female     Is Non-Hispanic African American: No     Diabetic: Yes     Tobacco smoker: No     Systolic Blood Pressure: 941 mmHg     Is BP treated: No     HDL Cholesterol: 71 mg/dL     Total Cholesterol: 167 mg/dL    Other: (CHADS2VASc if Afib, PHQ9 if depression, MMRC or CAT for COPD, ACT, DEXA)    BP Readings from Last 3 Encounters:  09/24/22  125/72  08/07/22 114/74  05/06/22 131/72      SDOH:  (Social Determinants of Health) assessments and interventions performed:    Allergies  Allergen Reactions   Atorvastatin Other (See Comments)    MUSCLE PAIN    Glyxambi [Empagliflozin-Linagliptin] Other (See Comments)    Frequent urination; GI issues    Metformin And Related Other (See Comments)    Legs numb   Semaglutide Nausea Only    rybelsus     Medications Reviewed Today     Reviewed by Lavera Guise, [facility] (Pharmacist) on 09/24/22 at [place] List Status: <None>   Medication Order Taking? Sig Documenting Provider Last Dose Status Informant  Accu-Chek Softclix Lancets lancets [id number] No USE 4 TIMES DAILY AS DIRECTED DX: E11.9 Dettinger, Fransisca Kaufmann, MD Taking Active   blood glucose meter kit and supplies KIT [id number] No Dispense based on patient and insurance preference. Use up to four times daily as directed. (FOR ICD-9 250.00, 250.01). Dettinger, Fransisca Kaufmann, MD Taking Active   Blood Glucose Monitoring Suppl (ACCU-CHEK GUIDE) w/Device KIT [id number] No USE 4 TIMES DAILY AS DIRECTED DX: E11.9 Dettinger, Fransisca Kaufmann, MD Taking Active   calcium carbonate (OS-CAL) 1250 (500 Ca) MG chewable tablet [id number] No Chew 1 tablet by mouth daily. [provider] Taking Active   cholecalciferol (VITAMIN D3) 25 MCG (1000 UNIT) tablet [id number] No Take 1,000 Units by mouth daily. [provider] Taking Active   famotidine (PEPCID) 20 MG tablet [id number] No Take 1 tablet (20 mg total) by mouth at bedtime.  Patient not taking: Reported on 09/08/2022   Dettinger, Fransisca Kaufmann, MD Not Taking Active   glipiZIDE (GLUCOTROL XL) 5 MG 24 hr tablet [id number] No Take 1 tablet (5 mg total) by mouth 2 (two) times daily with a meal. Dettinger, Fransisca Kaufmann, MD Taking Active   glucose blood (ACCU-CHEK GUIDE) test strip [id number] No USE 4 TIMES DAILY AS DIRECTED DX: E11.9 Dettinger, Fransisca Kaufmann, MD Taking Active   Multiple Vitamins-Minerals (VISION FORMULA 2 PO) [id number] No Take 1 capsule by mouth at bedtime. [provider] Taking Active Self  simvastatin (ZOCOR) 40 MG tablet [id number] No Take 1 tablet (40 mg total) by mouth daily. Dettinger, Fransisca Kaufmann, MD Taking Active               Goals Addressed               This Visit's Progress     Patient Stated     T2DM, HLD, CKD (pt-stated)        Current Barriers:  Unable to independently  afford treatment regimen Unable to achieve control of T2DM, HLD, CKD  Suboptimal therapeutic regimen for T2DM, HLD, CKD  Pharmacist Clinical Goal(s):  patient will verbalize ability to afford treatment regimen achieve control of T2DM, HLD, CKD as evidenced by GOAL LABS achieve improvement in  T2DM, HLD, CKD  as evidenced by GOAL LABS, [place] through collaboration with PharmD and provider.    Interventions: 1:1 collaboration with Dettinger, Fransisca Kaufmann, MD regarding development and update of comprehensive plan of care as evidenced by provider attestation and co-signature Inter-disciplinary care team collaboration (see longitudinal plan of care) Comprehensive medication review performed; medication list updated in electronic medical record  Diabetes: Goal on track: NO. Uncontrolled; current treatment:GLIPIZIDE;  INTOLERANCES: Rybelsus (reports N/V), glyxambi, metformin (all updated on med list) T2DM diagnosed approx.15 yrs ago Current GFR 58-CKD 3a, Current A1c 12.9% Patient reports intolerance to Glyxambi samples (frequent urination,  GI issues) she does not wish to take Discussed the likelihood of insulin or a form of injectable at follow up; patient would like to wait to start medication in 2 weeks; discussed the risk of delaying medication  Continue glipizide  Current glucose readings: fasting glucose: 200s? States her FBG was 134 this AM--will follow up to see if correlating?, post prandial glucose: >200-300 reports hyperglycemic symptoms Discussed meal planning options and Plate method for healthy eating Avoid sugary drinks and desserts Incorporate balanced protein, non starchy veggies, 1 serving of carbohydrate with each meal Increase water intake Increase physical activity as able Current exercise: encouraged/active at work  Hyperlipidemia Lipid Panel --almost at goal LDL<70 Will continue to address; ENCOURAGED STATIN COMPLIANCE, REVIEWED DIET/LIFESTYLE     Component Value Date/Time   CHOL 167 05/05/2022 0810   TRIG 122 05/05/2022 0810   HDL 71 05/05/2022 0810   CHOLHDL 2.4 05/05/2022 0810   LDLCALC 75 05/05/2022 0810   LABVLDL 21 05/05/2022 0810  Hypertension  -patient as goal <130/80 -continue current regiment and compliance with diet/lifestyle   Patient Goals/Self-Care Activities patient will:  - take medications as prescribed as evidenced by patient report and record review check glucose fasting daily or if symptomatic, document, and provide at future appointments collaborate with provider on medication access solutions target a minimum of 150 minutes of moderate intensity exercise weekly engage in dietary modifications by FOLLOWING A HEART HEALTHY DIET/HEALTHY PLATE METHOD         Plan: Telephone follow up appointment with care management team member scheduled for:  2 weeks      Regina Eck, PharmD, BCPS Clinical Pharmacist, [place]  [facility] Phone [phone]

## 2022-09-24 NOTE — Patient Instructions (Addendum)
Visit Information  Following are the goals we discussed today:  Current Barriers:  Unable to independently afford treatment regimen Unable to achieve control of T2DM, HLD, CKD  Suboptimal therapeutic regimen for T2DM, HLD, CKD  Pharmacist Clinical Goal(s):  patient will verbalize ability to afford treatment regimen achieve control of T2DM, HLD, CKD as evidenced by GOAL LABS achieve improvement in  T2DM, HLD, CKD  as evidenced by GOAL LABS, [place] through collaboration with PharmD and provider.    Interventions: 1:1 collaboration with Dettinger, Fransisca Kaufmann, MD regarding development and update of comprehensive plan of care as evidenced by provider attestation and co-signature Inter-disciplinary care team collaboration (see longitudinal plan of care) Comprehensive medication review performed; medication list updated in electronic medical record  Diabetes: Goal on track: NO. Uncontrolled; current treatment:GLIPIZIDE;  INTOLERANCES: Rybelsus (reports N/V), glyxambi, metformin (all updated on med list) T2DM diagnosed approx.15 yrs ago Current GFR 58-CKD 3a, Current A1c 12.9% Patient reports intolerance to Glyxambi samples (frequent urination, GI issues) she does not wish to take Discussed the likelihood of insulin or a form of injectable at follow up; patient would like to wait to start medication in 2 weeks; discussed the risk of delaying medication  Continue glipizide  Current glucose readings: fasting glucose: 200s? States her FBG was 134 this AM--will follow up to see if correlating?, post prandial glucose: >200-300 reports hyperglycemic symptoms Discussed meal planning options and Plate method for healthy eating Avoid sugary drinks and desserts Incorporate balanced protein, non starchy veggies, 1 serving of carbohydrate with each meal Increase water intake Increase physical activity as able Current exercise: encouraged/active at work  Hyperlipidemia Lipid Panel  --almost at goal LDL<70 Will continue to address; ENCOURAGED STATIN COMPLIANCE, REVIEWED DIET/LIFESTYLE    Component Value Date/Time   CHOL 167 05/05/2022 0810   TRIG 122 05/05/2022 0810   HDL 71 05/05/2022 0810   CHOLHDL 2.4 05/05/2022 0810   LDLCALC 75 05/05/2022 0810   LABVLDL 21 05/05/2022 0810   Hypertension  -patient as goal <130/80 -continue current regiment and compliance with diet/lifestyle   Patient Goals/Self-Care Activities patient will:  - take medications as prescribed as evidenced by patient report and record review check glucose fasting daily or if symptomatic, document, and provide at future appointments collaborate with provider on medication access solutions target a minimum of 150 minutes of moderate intensity exercise weekly engage in dietary modifications by FOLLOWING A HEART HEALTHY DIET/HEALTHY PLATE METHOD   Plan: Telephone follow up appointment with care management team member scheduled for:  3 weeks  Signature Regina Eck, PharmD, BCPS Clinical Pharmacist, [place]  [facility] Phone [phone]   Please call the care guide team at [phone] if you need to cancel or reschedule your appointment.   The patient verbalized understanding of instructions, educational materials, and care plan provided today and DECLINED offer to receive copy of patient instructions, educational materials, and care plan.

## 2022-09-25 ENCOUNTER — Encounter: Payer: Self-pay | Admitting: *Deleted

## 2022-09-28 DIAGNOSIS — M62830 Muscle spasm of back: Secondary | ICD-10-CM | POA: Diagnosis not present

## 2022-09-28 DIAGNOSIS — M9902 Segmental and somatic dysfunction of thoracic region: Secondary | ICD-10-CM | POA: Diagnosis not present

## 2022-09-28 DIAGNOSIS — M9901 Segmental and somatic dysfunction of cervical region: Secondary | ICD-10-CM | POA: Diagnosis not present

## 2022-09-28 DIAGNOSIS — M9903 Segmental and somatic dysfunction of lumbar region: Secondary | ICD-10-CM | POA: Diagnosis not present

## 2022-09-30 DIAGNOSIS — M9903 Segmental and somatic dysfunction of lumbar region: Secondary | ICD-10-CM | POA: Diagnosis not present

## 2022-09-30 DIAGNOSIS — M9902 Segmental and somatic dysfunction of thoracic region: Secondary | ICD-10-CM | POA: Diagnosis not present

## 2022-09-30 DIAGNOSIS — M9901 Segmental and somatic dysfunction of cervical region: Secondary | ICD-10-CM | POA: Diagnosis not present

## 2022-09-30 DIAGNOSIS — M62830 Muscle spasm of back: Secondary | ICD-10-CM | POA: Diagnosis not present

## 2022-10-05 DIAGNOSIS — M9901 Segmental and somatic dysfunction of cervical region: Secondary | ICD-10-CM | POA: Diagnosis not present

## 2022-10-05 DIAGNOSIS — M9902 Segmental and somatic dysfunction of thoracic region: Secondary | ICD-10-CM | POA: Diagnosis not present

## 2022-10-05 DIAGNOSIS — M9903 Segmental and somatic dysfunction of lumbar region: Secondary | ICD-10-CM | POA: Diagnosis not present

## 2022-10-05 DIAGNOSIS — M62830 Muscle spasm of back: Secondary | ICD-10-CM | POA: Diagnosis not present

## 2022-10-07 ENCOUNTER — Telehealth: Payer: Medicare HMO

## 2022-10-07 ENCOUNTER — Telehealth: Payer: Self-pay | Admitting: *Deleted

## 2022-10-07 NOTE — Telephone Encounter (Signed)
   CCM RN Visit Note   10/07/22 Name: JAIYANNA SAFRAN MRN: [id number]      DOB: May 11, 1951  Subjective: LOYCE FLAMING is a 71 y.o. year old female who is a primary care patient of Caryl Pina MD. The patient was referred to the Chronic Care Management team for assistance with care management needs subsequent to provider initiation of CCM services and plan of care.      An unsuccessful telephone outreach was attempted today to contact the patient about Chronic Care Management needs.    Plan:Telephone follow up appointment with care management team member scheduled for:  upon care guide rescheduling.  Jacqlyn Larsen RNC, BSN RN Case Manager [place] [phone]

## 2022-10-08 DIAGNOSIS — E785 Hyperlipidemia, unspecified: Secondary | ICD-10-CM | POA: Diagnosis not present

## 2022-10-08 DIAGNOSIS — M9901 Segmental and somatic dysfunction of cervical region: Secondary | ICD-10-CM | POA: Diagnosis not present

## 2022-10-08 DIAGNOSIS — M9903 Segmental and somatic dysfunction of lumbar region: Secondary | ICD-10-CM | POA: Diagnosis not present

## 2022-10-08 DIAGNOSIS — E1169 Type 2 diabetes mellitus with other specified complication: Secondary | ICD-10-CM

## 2022-10-08 DIAGNOSIS — M9902 Segmental and somatic dysfunction of thoracic region: Secondary | ICD-10-CM | POA: Diagnosis not present

## 2022-10-08 DIAGNOSIS — M62830 Muscle spasm of back: Secondary | ICD-10-CM | POA: Diagnosis not present

## 2022-10-12 DIAGNOSIS — M9903 Segmental and somatic dysfunction of lumbar region: Secondary | ICD-10-CM | POA: Diagnosis not present

## 2022-10-12 DIAGNOSIS — M62830 Muscle spasm of back: Secondary | ICD-10-CM | POA: Diagnosis not present

## 2022-10-12 DIAGNOSIS — M9902 Segmental and somatic dysfunction of thoracic region: Secondary | ICD-10-CM | POA: Diagnosis not present

## 2022-10-12 DIAGNOSIS — M9901 Segmental and somatic dysfunction of cervical region: Secondary | ICD-10-CM | POA: Diagnosis not present

## 2022-10-14 DIAGNOSIS — M9903 Segmental and somatic dysfunction of lumbar region: Secondary | ICD-10-CM | POA: Diagnosis not present

## 2022-10-14 DIAGNOSIS — M62830 Muscle spasm of back: Secondary | ICD-10-CM | POA: Diagnosis not present

## 2022-10-14 DIAGNOSIS — M9902 Segmental and somatic dysfunction of thoracic region: Secondary | ICD-10-CM | POA: Diagnosis not present

## 2022-10-14 DIAGNOSIS — M9901 Segmental and somatic dysfunction of cervical region: Secondary | ICD-10-CM | POA: Diagnosis not present

## 2022-10-19 DIAGNOSIS — M62830 Muscle spasm of back: Secondary | ICD-10-CM | POA: Diagnosis not present

## 2022-10-19 DIAGNOSIS — M9902 Segmental and somatic dysfunction of thoracic region: Secondary | ICD-10-CM | POA: Diagnosis not present

## 2022-10-19 DIAGNOSIS — M9903 Segmental and somatic dysfunction of lumbar region: Secondary | ICD-10-CM | POA: Diagnosis not present

## 2022-10-19 DIAGNOSIS — M9901 Segmental and somatic dysfunction of cervical region: Secondary | ICD-10-CM | POA: Diagnosis not present

## 2022-11-16 ENCOUNTER — Telehealth: Payer: Medicare HMO

## 2022-11-16 ENCOUNTER — Telehealth: Payer: Self-pay | Admitting: *Deleted

## 2022-11-16 NOTE — Telephone Encounter (Signed)
   CCM RN Visit Note   11/16/2022 Name: KAMEELA LEIPOLD MRN: [id number]      DOB: 10/05/1951  Subjective: Misty [name] is a 72 y.o. year old female who is a primary care patient of Caryl Pina MD. The patient was referred to the Chronic Care Management team for assistance with care management needs subsequent to provider initiation of CCM services and plan of care.      An unsuccessful telephone outreach was attempted today to contact the patient about Chronic Care Management needs.    Plan:Telephone follow up appointment with care management team member scheduled for:  upon care guide rescheduling.  Jacqlyn Larsen RNC, BSN RN Case Manager [place] [phone]

## 2022-11-17 DIAGNOSIS — M62830 Muscle spasm of back: Secondary | ICD-10-CM | POA: Diagnosis not present

## 2022-11-17 DIAGNOSIS — M9903 Segmental and somatic dysfunction of lumbar region: Secondary | ICD-10-CM | POA: Diagnosis not present

## 2022-11-17 DIAGNOSIS — M9901 Segmental and somatic dysfunction of cervical region: Secondary | ICD-10-CM | POA: Diagnosis not present

## 2022-11-17 DIAGNOSIS — M9902 Segmental and somatic dysfunction of thoracic region: Secondary | ICD-10-CM | POA: Diagnosis not present

## 2022-11-19 ENCOUNTER — Ambulatory Visit: Payer: Medicare HMO | Admitting: Family Medicine

## 2022-11-23 ENCOUNTER — Ambulatory Visit (INDEPENDENT_AMBULATORY_CARE_PROVIDER_SITE_OTHER): Payer: PPO | Admitting: Nurse Practitioner

## 2022-11-23 ENCOUNTER — Encounter: Payer: Self-pay | Admitting: Nurse Practitioner

## 2022-11-23 VITALS — BP 112/72 | HR 84 | Temp 97.2°F | Resp 20 | Ht 62.0 in | Wt 114.0 lb

## 2022-11-23 DIAGNOSIS — M545 Low back pain, unspecified: Secondary | ICD-10-CM | POA: Diagnosis not present

## 2022-11-23 MED ORDER — KETOROLAC TROMETHAMINE 60 MG/2ML IM SOLN
60.0000 mg | Freq: Once | INTRAMUSCULAR | Status: AC
  Administered 2022-11-23: 60 mg via INTRAMUSCULAR

## 2022-11-23 MED ORDER — PREDNISONE 20 MG PO TABS: ORAL_TABLET | ORAL | 0 refills | Status: DC

## 2022-11-23 MED ORDER — METHYLPREDNISOLONE ACETATE 80 MG/ML IJ SUSP: 80.0000 mg | Freq: Once | INTRAMUSCULAR | Status: DC

## 2022-11-23 MED ORDER — METHYLPREDNISOLONE ACETATE 40 MG/ML IJ SUSP
40.0000 mg | Freq: Once | INTRAMUSCULAR | Status: AC
  Administered 2022-11-23: 40 mg via INTRAMUSCULAR

## 2022-11-23 NOTE — Patient Instructions (Signed)
Back Exercises These exercises help to make your trunk and back strong. They also help to keep the lower back flexible. Doing these exercises can help to prevent or lessen pain in your lower back. If you have back pain, try to do these exercises 2-3 times each day or as told by your doctor. As you get better, do the exercises once each day. Repeat the exercises more often as told by your doctor. To stop back pain from coming back, do the exercises once each day, or as told by your doctor. Do exercises exactly as told by your doctor. Stop right away if you feel sudden pain or your pain gets worse. Exercises Single knee to chest Do these steps 3-5 times in a row for each leg: Lie on your back on a firm bed or the floor with your legs stretched out. Bring one knee to your chest. Grab your knee or thigh with both hands and hold it in place. Pull on your knee until you feel a gentle stretch in your lower back or butt. Keep doing the stretch for 10-30 seconds. Slowly let go of your leg and straighten it. Pelvic tilt Do these steps 5-10 times in a row: Lie on your back on a firm bed or the floor with your legs stretched out. Bend your knees so they point up to the ceiling. Your feet should be flat on the floor. Tighten your lower belly (abdomen) muscles to press your lower back against the floor. This will make your tailbone point up to the ceiling instead of pointing down to your feet or the floor. Stay in this position for 5-10 seconds while you gently tighten your muscles and breathe evenly. Cat-cow Do these steps until your lower back bends more easily: Get on your hands and knees on a firm bed or the floor. Keep your hands under your shoulders, and keep your knees under your hips. You may put padding under your knees. Let your head hang down toward your chest. Tighten (contract) the muscles in your belly. Point your tailbone toward the floor so your lower back becomes rounded like the back of a  cat. Stay in this position for 5 seconds. Slowly lift your head. Let the muscles of your belly relax. Point your tailbone up toward the ceiling so your back forms a sagging arch like the back of a cow. Stay in this position for 5 seconds.  Press-ups Do these steps 5-10 times in a row: Lie on your belly (face-down) on a firm bed or the floor. Place your hands near your head, about shoulder-width apart. While you keep your back relaxed and keep your hips on the floor, slowly straighten your arms to raise the top half of your body and lift your shoulders. Do not use your back muscles. You may change where you place your hands to make yourself more comfortable. Stay in this position for 5 seconds. Keep your back relaxed. Slowly return to lying flat on the floor.  Bridges Do these steps 10 times in a row: Lie on your back on a firm bed or the floor. Bend your knees so they point up to the ceiling. Your feet should be flat on the floor. Your arms should be flat at your sides, next to your body. Tighten your butt muscles and lift your butt off the floor until your waist is almost as high as your knees. If you do not feel the muscles working in your butt and the back of   your thighs, slide your feet 1-2 inches (2.5-5 cm) farther away from your butt. Stay in this position for 3-5 seconds. Slowly lower your butt to the floor, and let your butt muscles relax. If this exercise is too easy, try doing it with your arms crossed over your chest. Belly crunches Do these steps 5-10 times in a row: Lie on your back on a firm bed or the floor with your legs stretched out. Bend your knees so they point up to the ceiling. Your feet should be flat on the floor. Cross your arms over your chest. Tip your chin a little bit toward your chest, but do not bend your neck. Tighten your belly muscles and slowly raise your chest just enough to lift your shoulder blades a tiny bit off the floor. Avoid raising your body  higher than that because it can put too much stress on your lower back. Slowly lower your chest and your head to the floor. Back lifts Do these steps 5-10 times in a row: Lie on your belly (face-down) with your arms at your sides, and rest your forehead on the floor. Tighten the muscles in your legs and your butt. Slowly lift your chest off the floor while you keep your hips on the floor. Keep the back of your head in line with the curve in your back. Look at the floor while you do this. Stay in this position for 3-5 seconds. Slowly lower your chest and your face to the floor. Contact a doctor if: Your back pain gets a lot worse when you do an exercise. Your back pain does not get better within 2 hours after you exercise. If you have any of these problems, stop doing the exercises. Do not do them again unless your doctor says it is okay. Get help right away if: You have sudden, very bad back pain. If this happens, stop doing the exercises. Do not do them again unless your doctor says it is okay. This information is not intended to replace advice given to you by your health care provider. Make sure you discuss any questions you have with your health care provider. Document Revised: 01/08/2021 Document Reviewed: 01/08/2021 Elsevier Patient Education  2023 Elsevier Inc.  

## 2022-11-23 NOTE — Progress Notes (Signed)
   Subjective:    Patient ID: Misty [name], female    DOB: 04/18/1951, 72 y.o.   MRN: [id number]   Chief Complaint: Back Pain (No injury. History of back problems and has been seeing chiropractor/)   Back Pain This is a new problem. The current episode started in the past 7 days (friday of  last week). The problem occurs constantly. The problem has been gradually worsening since onset. The pain is present in the lumbar spine. The quality of the pain is described as burning. The pain does not radiate. The pain is at a severity of 8/10. The pain is severe. The pain is The same all the time. The symptoms are aggravated by twisting and standing. Stiffness is present All day. Pertinent negatives include no numbness or weakness. She has tried chiropractic manipulation (biofreeze) for the symptoms. The treatment provided no relief.       Review of Systems  Musculoskeletal:  Positive for back pain.  Neurological:  Negative for weakness and numbness.       Objective:   Physical Exam Vitals reviewed.  Constitutional:      Appearance: Normal appearance.  Cardiovascular:     Rate and Rhythm: Normal rate and regular rhythm.     Heart sounds: Normal heart sounds.  Pulmonary:     Effort: Pulmonary effort is normal.     Breath sounds: Normal breath sounds.  Musculoskeletal:     Comments: Rises slowly from sitting to standing Pain in lower back with pain on extension and rotation of lumbar spine (-) SLR bil Motor strength and sensation distally intact  Skin:    General: Skin is warm.  Neurological:     General: No focal deficit present.     Mental Status: She is alert and oriented to person, place, and time.  Psychiatric:        Mood and Affect: Mood normal.        Behavior: Behavior normal.     BP 112/72   Pulse 84   Temp (!) 97.2 F (36.2 C) (Temporal)   Resp 20   Ht '5\' 2"'$  (1.575 m)   Wt 114 lb (51.7 kg)   SpO2 94%   BMI 20.85 kg/m        Assessment & Plan:    Murlean Caller Schnieders in today with chief complaint of Back Pain (No injury. History of back problems and has been seeing chiropractor/)   1. Acute right-sided low back pain without sciatica Moist heat  Rest Stretching exercises - ketorolac (TORADOL) injection 60 mg - methylPREDNISolone acetate (DEPO-MEDROL) injection 80 mg - predniSONE (DELTASONE) 20 MG tablet; 2 po at sametime daily for 5 days-  Dispense: 10 tablet; Refill: 0    The above assessment and management plan was discussed with the patient. The patient verbalized understanding of and has agreed to the management plan. Patient is aware to call the clinic if symptoms persist or worsen. Patient is aware when to return to the clinic for a follow-up visit. Patient educated on when it is appropriate to go to the emergency department.   Mary-Margaret Hassell Done, FNP

## 2022-12-03 ENCOUNTER — Ambulatory Visit (INDEPENDENT_AMBULATORY_CARE_PROVIDER_SITE_OTHER): Payer: PPO | Admitting: Family Medicine

## 2022-12-03 ENCOUNTER — Encounter: Payer: Self-pay | Admitting: Family Medicine

## 2022-12-03 VITALS — BP 140/71 | HR 98 | Ht 62.0 in | Wt 110.0 lb

## 2022-12-03 DIAGNOSIS — R399 Unspecified symptoms and signs involving the genitourinary system: Secondary | ICD-10-CM | POA: Diagnosis not present

## 2022-12-03 DIAGNOSIS — E785 Hyperlipidemia, unspecified: Secondary | ICD-10-CM

## 2022-12-03 DIAGNOSIS — E1169 Type 2 diabetes mellitus with other specified complication: Secondary | ICD-10-CM

## 2022-12-03 LAB — URINALYSIS
Bilirubin, UA: NEGATIVE
Ketones, UA: NEGATIVE
Leukocytes,UA: NEGATIVE
Nitrite, UA: NEGATIVE
Protein,UA: NEGATIVE
RBC, UA: NEGATIVE
Specific Gravity, UA: 1.01 (ref 1.005–1.030)
Urobilinogen, Ur: 0.2 mg/dL (ref 0.2–1.0)
pH, UA: 6 (ref 5.0–7.5)

## 2022-12-03 LAB — BAYER DCA HB A1C WAIVED: HB A1C (BAYER DCA - WAIVED): 11.9 % — ABNORMAL HIGH (ref 4.8–5.6)

## 2022-12-03 MED ORDER — SIMVASTATIN 40 MG PO TABS: 40.0000 mg | ORAL_TABLET | Freq: Every day | ORAL | 3 refills | Status: DC

## 2022-12-03 MED ORDER — RYBELSUS 7 MG PO TABS: 7.0000 mg | ORAL_TABLET | Freq: Every day | ORAL | 5 refills | Status: DC

## 2022-12-03 MED ORDER — TRESIBA FLEXTOUCH 100 UNIT/ML ~~LOC~~ SOPN: 10.0000 [IU] | PEN_INJECTOR | Freq: Every day | SUBCUTANEOUS | 3 refills | Status: DC

## 2022-12-03 NOTE — Progress Notes (Signed)
BP (!) 140/71   Pulse 98   Ht '5\' 2"'$  (1.575 m)   Wt 110 lb (49.9 kg)   SpO2 97%   BMI 20.12 kg/m    Subjective:   Patient ID: Misty [name], female    DOB: 22-Dec-1950, 72 y.o.   MRN: [id number]  HPI: Misty [name] is a 72 y.o. female presenting on 12/03/2022 for Hypotension and uti symptoms   HPI Type 2 diabetes mellitus Patient comes in today for recheck of his diabetes. Patient has been currently taking glipizide and Rybelsus. Patient is not currently on an ACE inhibitor/ARB. Patient has not seen an ophthalmologist this year. Patient denies any issues with their feet. The symptom started onset as an adult hyperlipidemia ARE RELATED TO DM   Hyperlipidemia Patient is coming in for recheck of his hyperlipidemia. The patient is currently taking simvastatin. They deny any issues with myalgias or history of liver damage from it. They deny any focal numbness or weakness or chest pain.   Urinary symptoms and hypotension Patient has been having urinary frequency and some dysuria that is been increasing over the past week that has been going on for some time.  She did take Azo and it did not help.  She says she started to feel like her blood pressure is down and lightheaded and dizzy at times.  She denies any abdominal pain or nausea or vomiting.  Relevant past medical, surgical, family and social history reviewed and updated as indicated. Interim medical history since our last visit reviewed. Allergies and medications reviewed and updated.  Review of Systems  Constitutional:  Negative for chills and fever.  HENT:  Negative for congestion, ear discharge and ear pain.   Eyes:  Negative for redness and visual disturbance.  Respiratory:  Negative for chest tightness and shortness of breath.   Cardiovascular:  Negative for chest pain and leg swelling.  Gastrointestinal:  Negative for abdominal pain, nausea and vomiting.  Genitourinary:  Positive for dysuria, frequency and urgency.  Negative for difficulty urinating, flank pain and hematuria.  Musculoskeletal:  Negative for back pain and gait problem.  Skin:  Negative for rash.  Neurological:  Negative for light-headedness and headaches.  Psychiatric/Behavioral:  Negative for agitation and behavioral problems.   All other systems reviewed and are negative.   Per HPI unless specifically indicated above   Allergies as of 12/03/2022       Reactions   Atorvastatin Other (See Comments)   MUSCLE PAIN    Glyxambi [empagliflozin-linagliptin] Other (See Comments)   Frequent urination; GI issues; reports blood in urine (cleared after taking)   Metformin And Related Other (See Comments)   Legs numb   Semaglutide Nausea Only   rybelsus        Medication List        Accurate as of December 03, 2022  3:24 PM. If you have any questions, ask your nurse or doctor.          Accu-Chek Guide test strip Generic drug: glucose blood USE 4 TIMES DAILY AS DIRECTED DX: E11.9   Accu-Chek Guide w/Device Kit USE 4 TIMES DAILY AS DIRECTED DX: E11.9   Accu-Chek Softclix Lancets lancets USE 4 TIMES DAILY AS DIRECTED DX: E11.9   blood glucose meter kit and supplies Kit Dispense based on patient and insurance preference. Use up to four times daily as directed. (FOR ICD-9 250.00, 250.01).   calcium carbonate 1250 (500 Ca) MG chewable tablet Commonly known as: OS-CAL Chew 1 tablet  by mouth daily.   cholecalciferol 25 MCG (1000 UNIT) tablet Commonly known as: VITAMIN D3 Take 1,000 Units by mouth daily.   famotidine 20 MG tablet Commonly known as: Pepcid Take 1 tablet (20 mg total) by mouth at bedtime.   glipiZIDE 5 MG 24 hr tablet Commonly known as: GLUCOTROL XL Take 1 tablet (5 mg total) by mouth 2 (two) times daily with a meal.   predniSONE 20 MG tablet Commonly known as: DELTASONE 2 po at sametime daily for 5 days-   Rybelsus 7 MG Tabs Generic drug: Semaglutide Take 1 tablet (7 mg total) by mouth  daily. Started by: Worthy Rancher, MD   simvastatin 40 MG tablet Commonly known as: ZOCOR Take 1 tablet (40 mg total) by mouth daily.   Tyler Aas FlexTouch 100 UNIT/ML FlexTouch Pen Generic drug: insulin degludec Inject 10 Units into the skin daily. Started by: Worthy Rancher, MD   VISION FORMULA 2 PO Take 1 capsule by mouth at bedtime.         Objective:   BP (!) 140/71   Pulse 98   Ht '5\' 2"'$  (1.575 m)   Wt 110 lb (49.9 kg)   SpO2 97%   BMI 20.12 kg/m   Wt Readings from Last 3 Encounters:  12/03/22 110 lb (49.9 kg)  11/23/22 114 lb (51.7 kg)  08/07/22 116 lb (52.6 kg)    Physical Exam Vitals and nursing note reviewed.  Constitutional:      General: She is not in acute distress.    Appearance: She is well-developed. She is not diaphoretic.  Eyes:     Conjunctiva/sclera: Conjunctivae normal.  Cardiovascular:     Rate and Rhythm: Normal rate and regular rhythm.     Heart sounds: Normal heart sounds. No murmur heard. Pulmonary:     Effort: Pulmonary effort is normal. No respiratory distress.     Breath sounds: Normal breath sounds. No wheezing.  Musculoskeletal:        General: No swelling or tenderness. Normal range of motion.  Skin:    General: Skin is warm and dry.     Findings: No rash.  Neurological:     Mental Status: She is alert and oriented to person, place, and time.     Coordination: Coordination normal.  Psychiatric:        Behavior: Behavior normal.     Urinalysis shows 3+ glucose but is otherwise clear, no ketones are noted.  Assessment & Plan:   Problem List Items Addressed This Visit       Endocrine   Type 2 diabetes mellitus ([place]) - Primary   Relevant Medications   simvastatin (ZOCOR) 40 MG tablet   Semaglutide (RYBELSUS) 7 MG TABS   insulin degludec (TRESIBA FLEXTOUCH) 100 UNIT/ML FlexTouch Pen   Other Relevant Orders   Urine Culture   Urinalysis   BMP8+EGFR   CBC with Differential/Platelet   Bayer DCA Hb A1c Waived    Hyperlipidemia associated with type 2 diabetes mellitus (HCC)   Relevant Medications   simvastatin (ZOCOR) 40 MG tablet   Semaglutide (RYBELSUS) 7 MG TABS   insulin degludec (TRESIBA FLEXTOUCH) 100 UNIT/ML FlexTouch Pen   Other Visit Diagnoses     UTI symptoms       Relevant Orders   Urine Culture   Urinalysis       Urinary frequency appears to be from that glucose being so high and not from any sign of infection from what I can tell.  We will  run a culture still  There are 0 ketones in her urine so it does not appear that she is in ketosis but instructed her on warning signs for ketosis.  A1c is up at 11.9.  Will start insulin and increase Rybelsus to 7 mg. Also schedule an appointment to check up with pharmacist in 2 weeks. Follow up plan: Return in about 3 months (around 03/04/2023), or if symptoms worsen or fail to improve, for Diabetes recheck.  Counseling provided for all of the vaccine components Orders Placed This Encounter  Procedures   Urine Culture   Urinalysis   BMP8+EGFR   CBC with Differential/Platelet   Bayer DCA Hb A1c Waived    Caryl Pina, MD [facility] 12/03/2022, 3:24 PM

## 2022-12-04 LAB — CBC WITH DIFFERENTIAL/PLATELET
Basophils Absolute: 0 10*3/uL (ref 0.0–0.2)
Basos: 0 %
EOS (ABSOLUTE): 0 10*3/uL (ref 0.0–0.4)
Eos: 0 %
Hematocrit: 46.6 % (ref 34.0–46.6)
Hemoglobin: 14.7 g/dL (ref 11.1–15.9)
Immature Grans (Abs): 0 10*3/uL (ref 0.0–0.1)
Immature Granulocytes: 0 %
Lymphocytes Absolute: 1.1 10*3/uL (ref 0.7–3.1)
Lymphs: 14 %
MCH: 31.8 pg (ref 26.6–33.0)
MCHC: 31.5 g/dL (ref 31.5–35.7)
MCV: 101 fL — ABNORMAL HIGH (ref 79–97)
Monocytes Absolute: 0.1 10*3/uL (ref 0.1–0.9)
Monocytes: 1 %
Neutrophils Absolute: 6.6 10*3/uL (ref 1.4–7.0)
Neutrophils: 85 %
Platelets: 256 10*3/uL (ref 150–450)
RBC: 4.62 x10E6/uL (ref 3.77–5.28)
RDW: 11.3 % — ABNORMAL LOW (ref 11.7–15.4)
WBC: 7.8 10*3/uL (ref 3.4–10.8)

## 2022-12-04 LAB — BMP8+EGFR
BUN/Creatinine Ratio: 22 (ref 12–28)
BUN: 27 mg/dL (ref 8–27)
CO2: 21 mmol/L (ref 20–29)
Calcium: 9 mg/dL (ref 8.7–10.3)
Chloride: 93 mmol/L — ABNORMAL LOW (ref 96–106)
Creatinine, Ser: 1.23 mg/dL — ABNORMAL HIGH (ref 0.57–1.00)
Glucose: 666 mg/dL (ref 70–99)
Potassium: 5.4 mmol/L — ABNORMAL HIGH (ref 3.5–5.2)
Sodium: 131 mmol/L — ABNORMAL LOW (ref 134–144)
eGFR: 47 mL/min/{1.73_m2} — ABNORMAL LOW (ref >59)

## 2022-12-06 LAB — URINE CULTURE

## 2022-12-07 ENCOUNTER — Other Ambulatory Visit: Payer: Self-pay | Admitting: Family Medicine

## 2022-12-07 MED ORDER — CEPHALEXIN 500 MG PO CAPS: 500.0000 mg | ORAL_CAPSULE | Freq: Four times a day (QID) | ORAL | 0 refills | Status: DC

## 2022-12-08 ENCOUNTER — Telehealth: Payer: Self-pay | Admitting: *Deleted

## 2022-12-08 ENCOUNTER — Ambulatory Visit (INDEPENDENT_AMBULATORY_CARE_PROVIDER_SITE_OTHER): Payer: HMO | Admitting: *Deleted

## 2022-12-08 DIAGNOSIS — E1169 Type 2 diabetes mellitus with other specified complication: Secondary | ICD-10-CM

## 2022-12-08 NOTE — Patient Instructions (Signed)
Please call the care guide team at [phone] if you need to cancel or reschedule your appointment.   If you are experiencing a Mental Health or [place] or need someone to talk to, please call the Suicide and Crisis Lifeline: 988 call the [place] National Suicide Prevention Lifeline: 703-073-5969 or TTY: (380) 108-3436 TTY 805-491-2932) to talk to a trained counselor call 1-800-273-TALK (toll free, 24 hour hotline) go to [facility] [address] [place] [phone]) call the [facility]: [phone] call 911   Following is a copy of the CCM Program Consent:  CCM service includes personalized support from designated clinical staff supervised by the physician, including individualized plan of care and coordination with other care providers 24/7 contact phone numbers for assistance for urgent and routine care needs. Service will only be billed when office clinical staff spend 20 minutes or more in a month to coordinate care. Only one practitioner may furnish and bill the service in a calendar month. The patient may stop CCM services at amy time (effective at the end of the month) by phone call to the office staff. The patient will be responsible for cost sharing (co-pay) or up to 20% of the service fee (after annual deductible is met)  Following is a copy of your full provider care plan:   Goals Addressed             This Visit's Progress    CCM (DIABETES) EXPECTED OUTCOME:  MONITOR, SELF-MANAGE AND REDUCE SYMPTOMS OF DIABETES       Current Barriers:  Knowledge Deficits related to Diabetes Chronic Disease Management support and education needs related to Diabetes and diet Patient reports she lives with her spouse, is independent in all aspects of her care, works full time M-F 3 pm - 11 pm in [place], [place] at [employer] (works in [place]) pt reports she needs to work on her diet, states she eats too  many starchy foods such as potatoes, states fasting ranges for CBG usually around 200-300, random ranges around 300,  recent AIC is 11.9 on 12/02/22,  pt states she has been sick and had been on prednisone, now finished prednisone and is on antibiotic for urinary tract infection, states recently prescribed Tresiba and has not started taking yet but plans to this week, pt is currently working with [facility] pharmacist.  Planned Interventions: Reviewed medications with patient and discussed importance of medication adherence;        Reviewed prescribed diet with patient carbohydrate modified; Discussed plans with patient for ongoing care management follow up and provided patient with direct contact information for care management team;      Advised patient, providing education and rationale, to check cbg twice daily and record        Review of patient status, including review of consultants reports, relevant laboratory and other test results, and medications completed;       Take all medications as prescribed, start taking Tresiba  Symptom Management: Take medications as prescribed   Attend all scheduled provider appointments Call pharmacy for medication refills 3-7 days in advance of running out of medications Attend church or other social activities Perform all self care activities independently  Perform IADL's (shopping, preparing meals, housekeeping, managing finances) independently Call provider office for new concerns or questions  check blood sugar at prescribed times: twice daily check feet daily for cuts, sores or redness enter blood sugar readings and medication or insulin into daily log take the blood  sugar log to all doctor visits take the blood sugar meter to all doctor visits trim toenails straight across fill half of plate with vegetables limit fast food meals to no more than 1 per week manage portion size read food labels for fat, fiber, carbohydrates and portion size Finish all  of the antibiotic Start taking Tyler Aas as prescribed Be mindful of carbohydrate intake at each meal- too much rice, bread, pasta, potatoes will elevate your blood sugar Continue exercising 3 x per week, keep up the good work  Follow Up Plan: Telephone follow up appointment with care management team member scheduled for:  01/21/23 at 1045 am       CCM (HYPERLIPIDEMIA) EXPECTED OUTCOME: MONITOR, SELF-MANAGE AND REDUCE SYMPTOMS OF HYPERLIPIDEMIA       Current Barriers:  Knowledge Deficits related to Hyperlipidemia Chronic Disease Management support and education needs related to Hyperlipidemia and diet Patient reports she would like to work on her diet, she exercises 3 x per week, continues to work full time  Planned Interventions: Provider established cholesterol goals reviewed; Counseled on importance of regular laboratory monitoring as prescribed; Reviewed importance of limiting foods high in cholesterol; Reviewed exercise goals and target of 150 minutes per week; Reinforced importance of following heart healthy diet, and avoiding trans/ saturated fats Reviewed all upcoming scheduled appointments  Symptom Management: Take medications as prescribed   Attend all scheduled provider appointments Call pharmacy for medication refills 3-7 days in advance of running out of medications Attend church or other social activities Perform all self care activities independently  Perform IADL's (shopping, preparing meals, housekeeping, managing finances) independently Call provider office for new concerns or questions  - call for medicine refill 2 or 3 days before it runs out - take all medications exactly as prescribed - call doctor with any symptoms you believe are related to your medicine - call doctor when you experience any new symptoms - go to all doctor appointments as scheduled - adhere to prescribed diet: heart healthy Follow a heart healthy diet- avoid trans/ saturated fats (chips,  cookies, pre-packaged foods) Broil, bake foods instead of frying Continue to exercise  Follow Up Plan: Telephone follow up appointment with care management team member scheduled for:  01/21/23 at 1045 am          Patient verbalizes understanding of instructions and care plan provided today and agrees to view in [place]. Active MyChart status and patient understanding of how to access instructions and care plan via MyChart confirmed with patient.     Telephone follow up appointment with care management team member scheduled for:  01/21/23 at 1045 am

## 2022-12-08 NOTE — Chronic Care Management (AMB) (Signed)
Chronic Care Management   CCM RN Visit Note  12/08/2022 Name: Misty [name] MRN: [id number] DOB: [birth year]  Subjective: Misty [name] is a 72 y.o. year old female who is a primary care patient of Dettinger, Fransisca Kaufmann, MD. The patient was referred to the Chronic Care Management team for assistance with care management needs subsequent to provider initiation of CCM services and plan of care.    Today's Visit:  Engaged with patient by telephone for follow up visit.        Goals Addressed             This Visit's Progress    CCM (DIABETES) EXPECTED OUTCOME:  MONITOR, SELF-MANAGE AND REDUCE SYMPTOMS OF DIABETES       Current Barriers:  Knowledge Deficits related to Diabetes Chronic Disease Management support and education needs related to Diabetes and diet Patient reports she lives with her spouse, is independent in all aspects of her care, works full time M-F 3 pm - 11 pm in [place], [place] at [employer] (works in [place]) pt reports she needs to work on her diet, states she eats too many starchy foods such as potatoes, states fasting ranges for CBG usually around 200-300, random ranges around 300,  recent AIC is 11.9 on 12/02/22,  pt states she has been sick and had been on prednisone, now finished prednisone and is on antibiotic for urinary tract infection, states recently prescribed Tresiba and has not started taking yet but plans to this week, pt is currently working with [facility] pharmacist.  Planned Interventions: Reviewed medications with patient and discussed importance of medication adherence;        Reviewed prescribed diet with patient carbohydrate modified; Discussed plans with patient for ongoing care management follow up and provided patient with direct contact information for care management team;      Advised patient, providing education and rationale, to check cbg twice daily and record        Review of patient status, including review of consultants  reports, relevant laboratory and other test results, and medications completed;       Take all medications as prescribed, start taking Tresiba  Symptom Management: Take medications as prescribed   Attend all scheduled provider appointments Call pharmacy for medication refills 3-7 days in advance of running out of medications Attend church or other social activities Perform all self care activities independently  Perform IADL's (shopping, preparing meals, housekeeping, managing finances) independently Call provider office for new concerns or questions  check blood sugar at prescribed times: twice daily check feet daily for cuts, sores or redness enter blood sugar readings and medication or insulin into daily log take the blood sugar log to all doctor visits take the blood sugar meter to all doctor visits trim toenails straight across fill half of plate with vegetables limit fast food meals to no more than 1 per week manage portion size read food labels for fat, fiber, carbohydrates and portion size Finish all of the antibiotic Start taking Tyler Aas as prescribed Be mindful of carbohydrate intake at each meal- too much rice, bread, pasta, potatoes will elevate your blood sugar Continue exercising 3 x per week, keep up the good work  Follow Up Plan: Telephone follow up appointment with care management team member scheduled for:  01/21/23 at 1045 am       CCM (HYPERLIPIDEMIA) EXPECTED OUTCOME: MONITOR, SELF-MANAGE AND REDUCE SYMPTOMS OF HYPERLIPIDEMIA       Current Barriers:  Knowledge Deficits related to Hyperlipidemia  Chronic Disease Management support and education needs related to Hyperlipidemia and diet Patient reports she would like to work on her diet, she exercises 3 x per week, continues to work full time  Planned Interventions: Provider established cholesterol goals reviewed; Counseled on importance of regular laboratory monitoring as prescribed; Reviewed importance of  limiting foods high in cholesterol; Reviewed exercise goals and target of 150 minutes per week; Reinforced importance of following heart healthy diet, and avoiding trans/ saturated fats Reviewed all upcoming scheduled appointments  Symptom Management: Take medications as prescribed   Attend all scheduled provider appointments Call pharmacy for medication refills 3-7 days in advance of running out of medications Attend church or other social activities Perform all self care activities independently  Perform IADL's (shopping, preparing meals, housekeeping, managing finances) independently Call provider office for new concerns or questions  - call for medicine refill 2 or 3 days before it runs out - take all medications exactly as prescribed - call doctor with any symptoms you believe are related to your medicine - call doctor when you experience any new symptoms - go to all doctor appointments as scheduled - adhere to prescribed diet: heart healthy Follow a heart healthy diet- avoid trans/ saturated fats (chips, cookies, pre-packaged foods) Broil, bake foods instead of frying Continue to exercise  Follow Up Plan: Telephone follow up appointment with care management team member scheduled for:  01/21/23 at 1045 am          Plan:Telephone follow up appointment with care management team member scheduled for:  01/21/23 at [place] am  Jacqlyn Larsen [facility], BSN RN Case Manager [place] [phone]

## 2022-12-08 NOTE — Telephone Encounter (Signed)
   CCM RN Visit Note   12/08/2022 Name: Misty [name] MRN: [id number]      DOB: [birth year]  Subjective: Misty [name] is a 72 y.o. year old female who is a primary care patient of Caryl Pina MD. The patient was referred to the Chronic Care Management team for assistance with care management needs subsequent to provider initiation of CCM services and plan of care.      Third unsuccessful telephone outreach was attempted today to contact the patient about Chronic Care Management needs.The patient was referred to the case management team by the provider who initiated CCM services. The provider has been notified of our unsuccessful attempts to make or maintain contact with the patient.   Plan:We have been unable to make contact with the patient for follow up. The care management team is available to follow up with the patient after provider conversation with the patient regarding recommendation for care management engagement and subsequent re-referral to the care management team.  Jacqlyn Larsen [facility], BSN RN Case Manager [place] [phone]

## 2022-12-09 DIAGNOSIS — E785 Hyperlipidemia, unspecified: Secondary | ICD-10-CM | POA: Diagnosis not present

## 2022-12-09 DIAGNOSIS — E1169 Type 2 diabetes mellitus with other specified complication: Secondary | ICD-10-CM

## 2022-12-18 ENCOUNTER — Other Ambulatory Visit: Payer: HMO

## 2022-12-18 DIAGNOSIS — E1169 Type 2 diabetes mellitus with other specified complication: Secondary | ICD-10-CM

## 2022-12-18 LAB — BAYER DCA HB A1C WAIVED: HB A1C (BAYER DCA - WAIVED): 12.7 % — ABNORMAL HIGH (ref 4.8–5.6)

## 2022-12-21 ENCOUNTER — Ambulatory Visit (INDEPENDENT_AMBULATORY_CARE_PROVIDER_SITE_OTHER): Payer: HMO | Admitting: Family Medicine

## 2022-12-21 ENCOUNTER — Encounter: Payer: Self-pay | Admitting: Family Medicine

## 2022-12-21 VITALS — BP 148/77 | HR 88 | Ht 62.0 in | Wt 110.0 lb

## 2022-12-21 DIAGNOSIS — E1169 Type 2 diabetes mellitus with other specified complication: Secondary | ICD-10-CM | POA: Diagnosis not present

## 2022-12-21 DIAGNOSIS — Z7984 Long term (current) use of oral hypoglycemic drugs: Secondary | ICD-10-CM

## 2022-12-21 LAB — GLUCOSE HEMOCUE WAIVED: Glu Hemocue Waived: 319 mg/dL — ABNORMAL HIGH (ref 70–99)

## 2022-12-21 NOTE — Progress Notes (Signed)
BP (!) 148/77   Pulse 88   Ht 5' 2"$  (1.575 m)   Wt 110 lb (49.9 kg)   SpO2 98%   BMI 20.12 kg/m    Subjective:   Patient ID: Misty [name], female    DOB: 1951/08/14, 72 y.o.   MRN: [id number]  HPI: Misty [name] is a 72 y.o. female presenting on 12/21/2022 for Medical Management of Chronic Issues and Diabetes   HPI Type 2 diabetes mellitus Patient comes in today for recheck of his diabetes. Patient has been currently taking glipizide, she says her blood sugars are doing better because she started taking sugar defender.  She did not start the [place].. Patient can currently on an ACE inhibitor/ARB. Patient has not seen an ophthalmologist this year. Patient denies any issues with their feet. The symptom started onset as an adult hyperlipidemia ARE RELATED TO DM.  Relevant past medical, surgical, family and social history reviewed and updated as indicated. Interim medical history since our last visit reviewed. Allergies and medications reviewed and updated.  Review of Systems  Constitutional:  Negative for chills and fever.  Eyes:  Negative for visual disturbance.  Respiratory:  Negative for chest tightness and shortness of breath.   Cardiovascular:  Negative for chest pain and leg swelling.  Musculoskeletal:  Negative for back pain and gait problem.  Skin:  Negative for rash.  Neurological:  Negative for dizziness, light-headedness and headaches.  Psychiatric/Behavioral:  Negative for agitation and behavioral problems.   All other systems reviewed and are negative.   Per HPI unless specifically indicated above   Allergies as of 12/21/2022       Reactions   Atorvastatin Other (See Comments)   MUSCLE PAIN    Glyxambi [empagliflozin-linagliptin] Other (See Comments)   Frequent urination; GI issues; reports blood in urine (cleared after taking)   Metformin And Related Other (See Comments)   Legs numb   Semaglutide Nausea Only   rybelsus        Medication List         Accurate as of December 21, 2022 10:41 AM. If you have any questions, ask your nurse or doctor.          STOP taking these medications    cephALEXin 500 MG capsule Commonly known as: KEFLEX Stopped by: Fransisca Kaufmann Agustin Swatek, MD   predniSONE 20 MG tablet Commonly known as: DELTASONE Stopped by: Worthy Rancher, MD   Rybelsus 7 MG Tabs Generic drug: Semaglutide Stopped by: Fransisca Kaufmann Deyona Soza, MD       TAKE these medications    Accu-Chek Guide test strip Generic drug: glucose blood USE 4 TIMES DAILY AS DIRECTED DX: E11.9   Accu-Chek Guide w/Device Kit USE 4 TIMES DAILY AS DIRECTED DX: E11.9   Accu-Chek Softclix Lancets lancets USE 4 TIMES DAILY AS DIRECTED DX: E11.9   blood glucose meter kit and supplies Kit Dispense based on patient and insurance preference. Use up to four times daily as directed. (FOR ICD-9 250.00, 250.01).   calcium carbonate 1250 (500 Ca) MG chewable tablet Commonly known as: OS-CAL Chew 1 tablet by mouth daily.   cholecalciferol 25 MCG (1000 UNIT) tablet Commonly known as: VITAMIN D3 Take 1,000 Units by mouth daily.   famotidine 20 MG tablet Commonly known as: Pepcid Take 1 tablet (20 mg total) by mouth at bedtime.   glipiZIDE 5 MG 24 hr tablet Commonly known as: GLUCOTROL XL Take 1 tablet (5 mg total) by mouth 2 (two) times daily  with a meal.   simvastatin 40 MG tablet Commonly known as: ZOCOR Take 1 tablet (40 mg total) by mouth daily.   Tyler Aas FlexTouch 100 UNIT/ML FlexTouch Pen Generic drug: insulin degludec Inject 10 Units into the skin daily.   VISION FORMULA 2 PO Take 1 capsule by mouth at bedtime.   vitamin E 45 MG (100 UNITS) capsule Take 100 Units by mouth daily.         Objective:   BP (!) 148/77   Pulse 88   Ht 5' 2"$  (1.575 m)   Wt 110 lb (49.9 kg)   SpO2 98%   BMI 20.12 kg/m   Wt Readings from Last 3 Encounters:  12/21/22 110 lb (49.9 kg)  12/03/22 110 lb (49.9 kg)  11/23/22 114 lb (51.7  kg)    Physical Exam Vitals and nursing note reviewed.  Constitutional:      General: She is not in acute distress.    Appearance: She is well-developed. She is not diaphoretic.  Eyes:     Conjunctiva/sclera: Conjunctivae normal.  Cardiovascular:     Rate and Rhythm: Normal rate and regular rhythm.     Heart sounds: Normal heart sounds. No murmur heard. Pulmonary:     Effort: Pulmonary effort is normal. No respiratory distress.     Breath sounds: Normal breath sounds. No wheezing.  Musculoskeletal:        General: No swelling or tenderness. Normal range of motion.  Skin:    General: Skin is warm and dry.     Findings: No rash.  Neurological:     Mental Status: She is alert and oriented to person, place, and time.     Coordination: Coordination normal.  Psychiatric:        Behavior: Behavior normal.       Assessment & Plan:   Problem List Items Addressed This Visit       Endocrine   Type 2 diabetes mellitus ([place]) - Primary   Relevant Orders   Glucose Hemocue Waived    Patient has stopped the Rybelsus because of stomach issues.  She says she never took the [place] and instead started an over-the-counter supplement called sugar defender.  She says her blood sugars are running between 140 and 200 since she started it she also says that she has been trying to watch her diet a little bit better.  She is still taking the glipizide. Spot glucose here in the office is still 319  Heavily encouraged that she has to start the Tresiba Follow up plan: Return in about 3 months (around 03/09/2023), or if symptoms worsen or fail to improve, for Patient should already have an appointment towards the end of April but make sure she has.  Counseling provided for all of the vaccine components Orders Placed This Encounter  Procedures   Glucose Hemocue [employer] Larsen Zettel, MD [facility] 12/21/2022, 10:41 AM

## 2022-12-24 ENCOUNTER — Encounter: Payer: Self-pay | Admitting: Pharmacist

## 2022-12-24 ENCOUNTER — Ambulatory Visit (INDEPENDENT_AMBULATORY_CARE_PROVIDER_SITE_OTHER): Payer: HMO | Admitting: Pharmacist

## 2022-12-24 VITALS — BP 125/65 | HR 55

## 2022-12-24 DIAGNOSIS — Z794 Long term (current) use of insulin: Secondary | ICD-10-CM

## 2022-12-24 DIAGNOSIS — E782 Mixed hyperlipidemia: Secondary | ICD-10-CM | POA: Diagnosis not present

## 2022-12-24 DIAGNOSIS — E1169 Type 2 diabetes mellitus with other specified complication: Secondary | ICD-10-CM | POA: Diagnosis not present

## 2022-12-24 MED ORDER — BD PEN NEEDLE NANO 2ND GEN 32G X 4 MM MISC: 11 refills | Status: AC

## 2022-12-24 NOTE — Progress Notes (Signed)
  Pharmacy Note   12/24/22 Name:  Misty [name]     MRN:  [id number]      DOB:  Dec 13, 1950   Summary:   Diabetes: Goal on track: NO. Uncontrolled; current treatment:GLIPIZIDE;  INTOLERANCES: Rybelsus (reports N/V), glyxambi (states frequent urination/wetting/bleeding, metformin (all updated on med list) T2DM diagnosed approx.15 yrs ago Current GFR 58-->47-CKD 3a, Current A1c 12.9-->12.7% Patient reports intolerance to Glyxambi samples (frequent urination, GI issues, bleeding/urine--has now cleared up) she does not wish to take Continue glipizide  START TRESIBA 10 UNITS as prescribed by PCP Benefit vs risk discussed with patient She states she will start insulin tonight Current glucose readings: fasting glucose: 300s USING ACCUCHECK GUIDE Reports hyperglycemic symptoms Discussed meal planning options and Plate method for healthy eating Avoid sugary drinks and desserts Incorporate balanced protein, non starchy veggies, 1 serving of carbohydrate with each meal Increase water intake Increase physical activity as able Current exercise: encouraged/active at work   Hyperlipidemia Lipid Panel --almost at goal LDL<70 Will continue to address; ENCOURAGED STATIN COMPLIANCE, REVIEWED DIET/LIFESTYLE         Component Value Date/Time    CHOL 167 05/05/2022 0810    TRIG 122 05/05/2022 0810    HDL 71 05/05/2022 0810    CHOLHDL 2.4 05/05/2022 0810    LDLCALC 75 05/05/2022 0810    LABVLDL 21 05/05/2022 0810    Hypertension  -patient as goal <130/80 -continue current regiment and compliance with diet/lifestyle      PLAN:  START TRESIBA NOW 10 UNITS NIGHTLY--WILL LIKELY NEED PEN NEEDLES (RX SENT) CONTINUE GLIPIZIDE D/C RYBELSUS CONSIDER CGM SINCE NOW ON INSULIN WOULD QUALIFY $0 COPAY AT LOCAL DRUGSTORE  F/U WITH PHARMD IN 2-4 WEEKS FOR COMPLIANCE CHECK.  Regina Eck, PharmD, BCPS, BCACP Clinical Pharmacist, [place]  II  T  [phone]

## 2022-12-30 IMAGING — MG DIGITAL SCREENING BILAT W/ CAD
4 series · 4 of 4 positions shown · non-contrast
Comparison: Previous exam(s).

CLINICAL DATA: Screening.

EXAM:
DIGITAL SCREENING BILATERAL MAMMOGRAM WITH CAD
TECHNIQUE: Bilateral screening digital craniocaudal and mediolateral oblique
mammograms were obtained. The images were evaluated with
computer-aided detection.

[R CC]
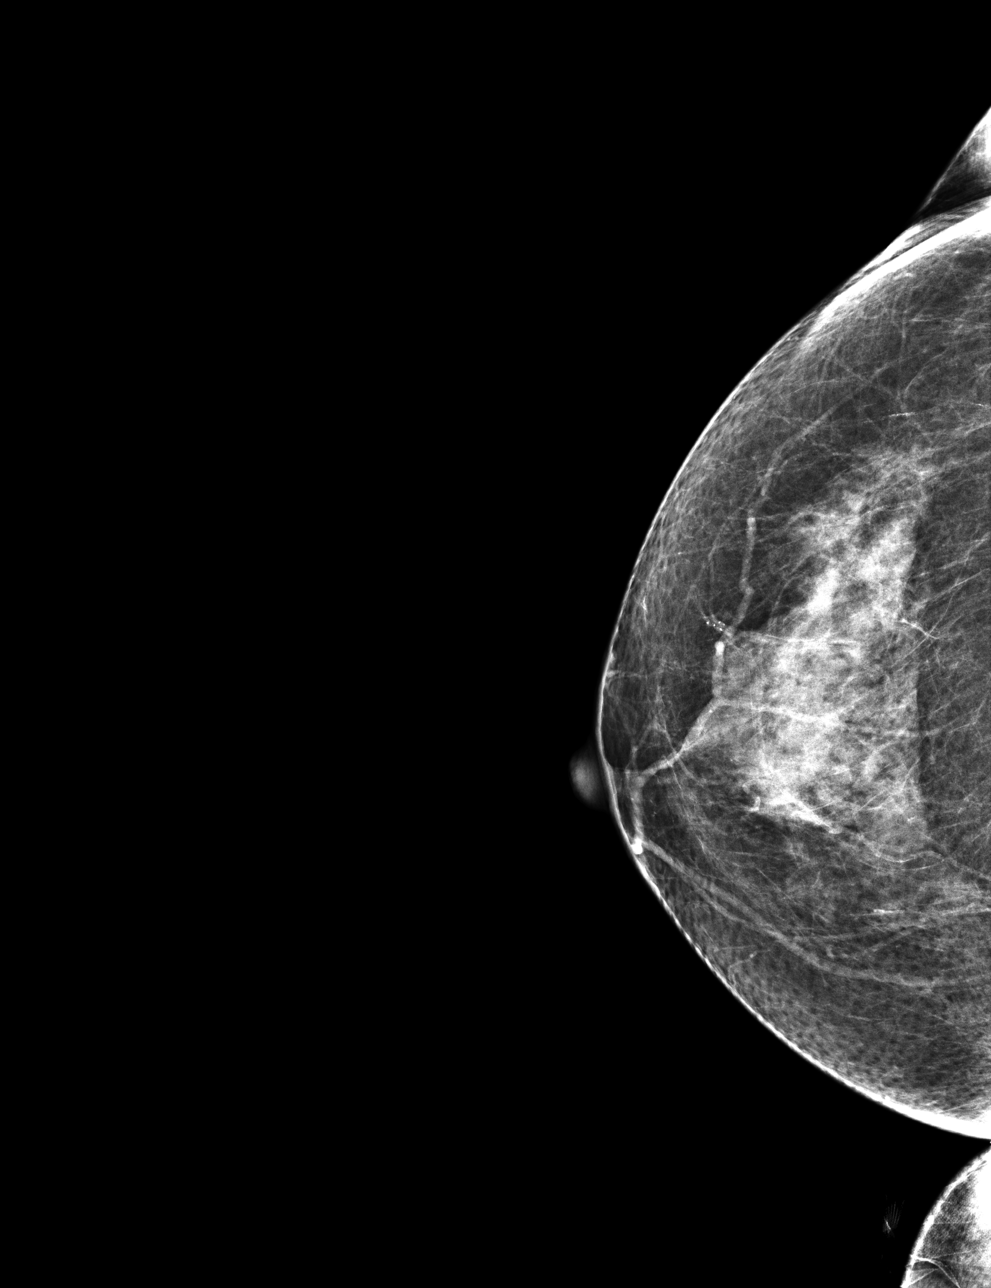

[L CC]
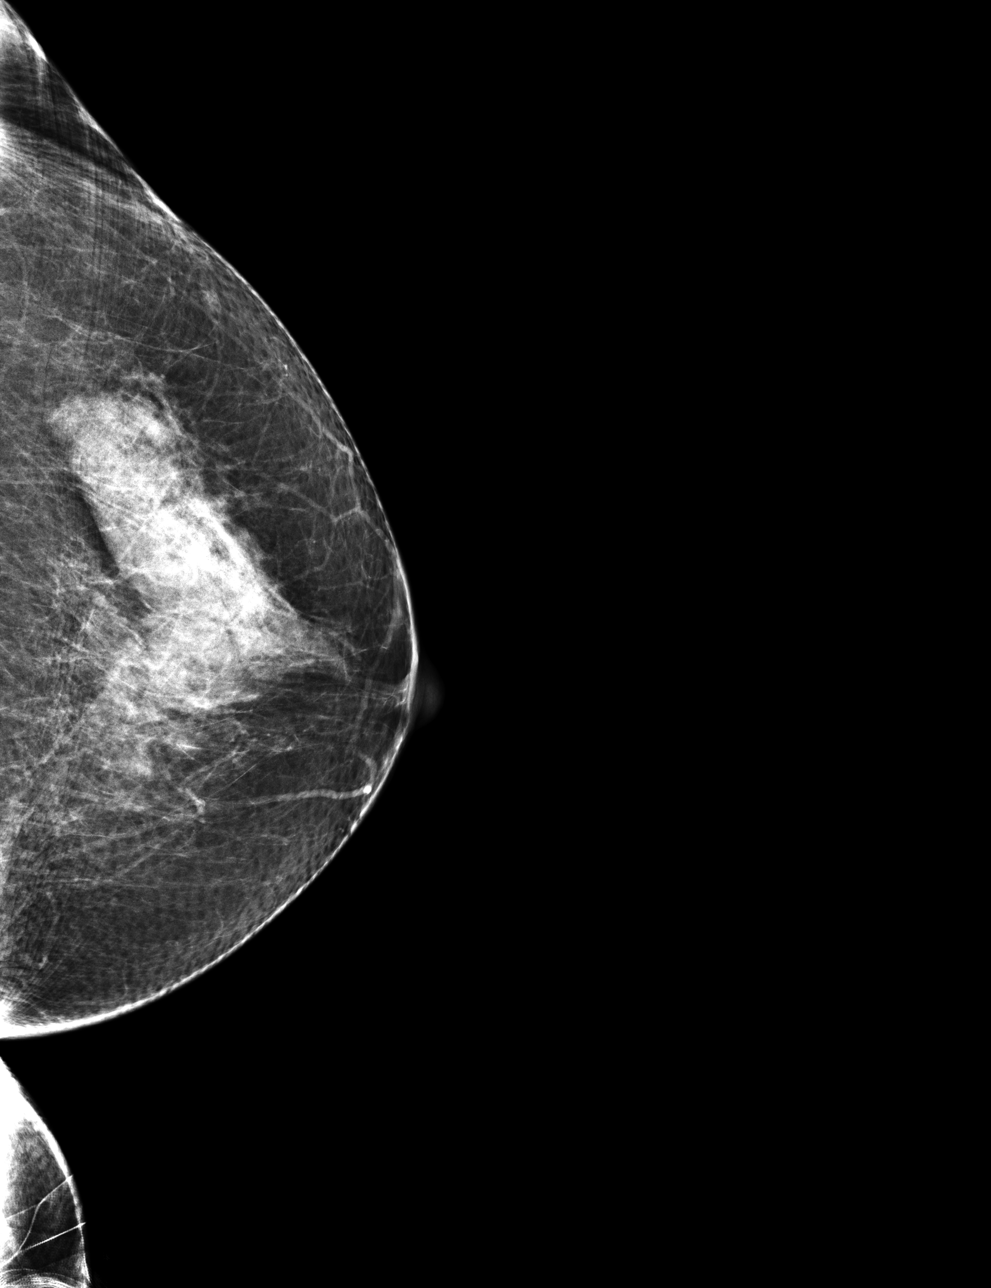

[L MLO]
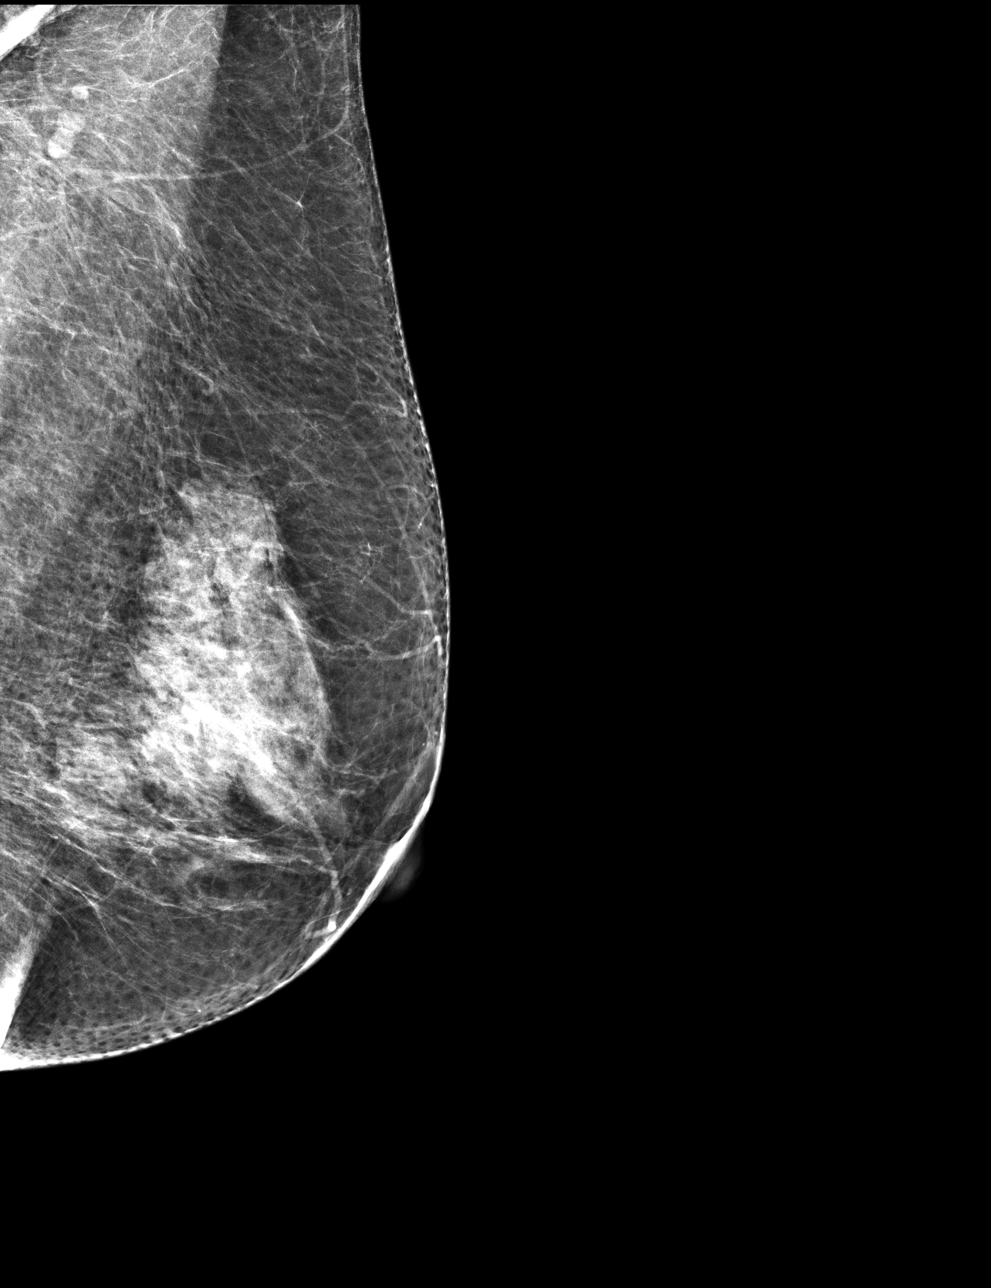

[R MLO]
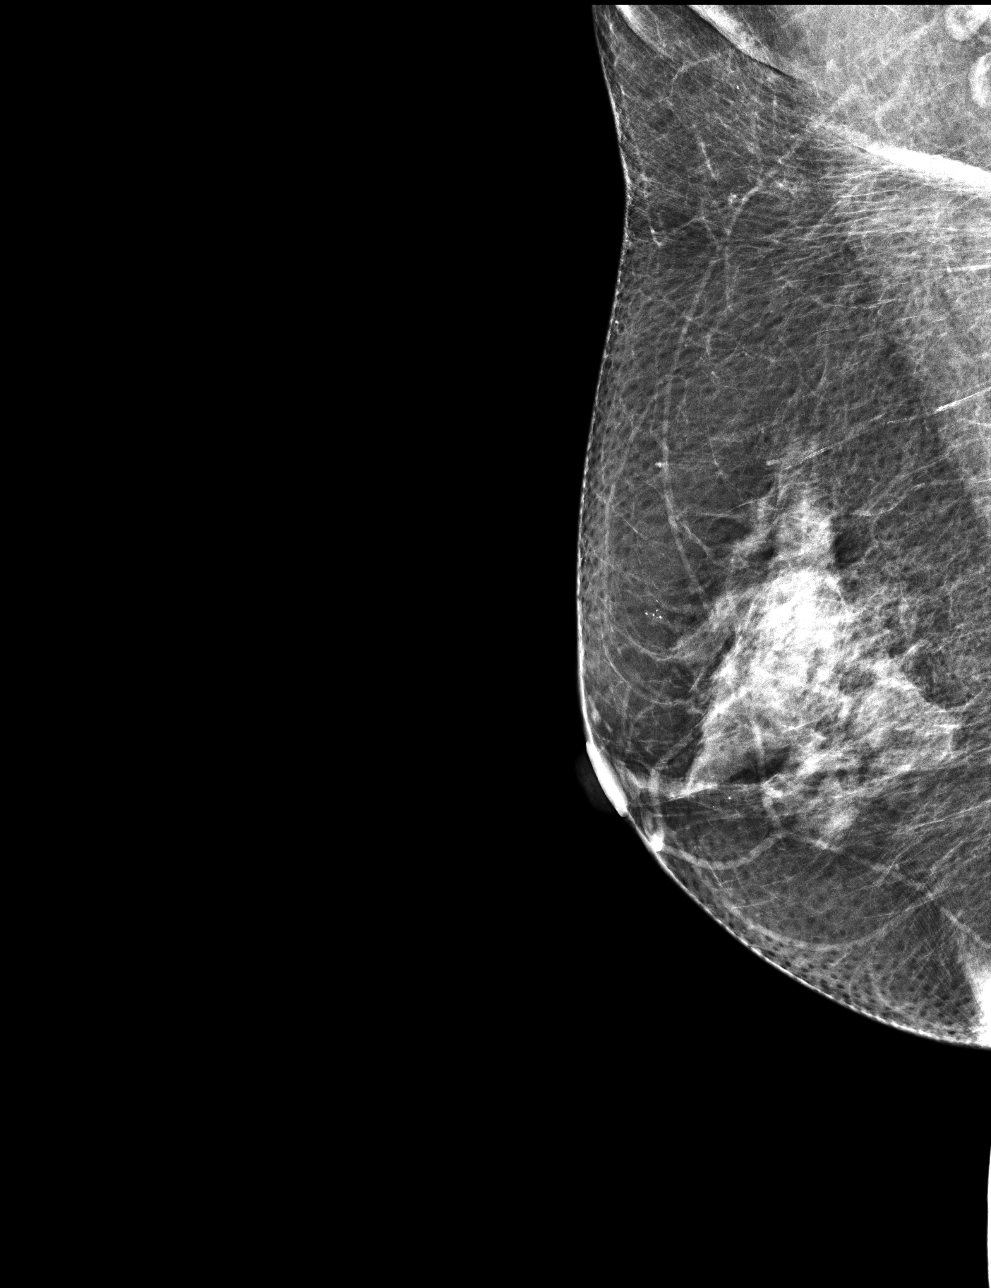

[4 of 4 positions shown; findings below may reference images not displayed]

ACR Breast Density Category c: The breast tissue is heterogeneously
dense, which may obscure small masses.
FINDINGS: There are no findings suspicious for malignancy.
IMPRESSION: No mammographic evidence of malignancy. A result letter of this
screening mammogram will be mailed directly to the patient.

RECOMMENDATION:
Screening mammogram in one year. (Code:TJ-B-ZPA)

BI-RADS CATEGORY  1: Negative.

## 2023-01-21 ENCOUNTER — Ambulatory Visit (INDEPENDENT_AMBULATORY_CARE_PROVIDER_SITE_OTHER): Payer: HMO | Admitting: *Deleted

## 2023-01-21 DIAGNOSIS — E1169 Type 2 diabetes mellitus with other specified complication: Secondary | ICD-10-CM

## 2023-01-21 NOTE — Patient Instructions (Signed)
Please call the care guide team at [phone] if you need to cancel or reschedule your appointment.   If you are experiencing a Mental Health or [place] or need someone to talk to, please call the Suicide and Crisis Lifeline: 988 call the [place] National Suicide Prevention Lifeline: (215)447-8980 or TTY: (838)623-0203 TTY (204)731-8094) to talk to a trained counselor call 1-800-273-TALK (toll free, 24 hour hotline) go to [facility] [address] [place] [phone]) call the [facility]: [phone] call 911   Following is a copy of the CCM Program Consent:  CCM service includes personalized support from designated clinical staff supervised by the physician, including individualized plan of care and coordination with other care providers 24/7 contact phone numbers for assistance for urgent and routine care needs. Service will only be billed when office clinical staff spend 20 minutes or more in a month to coordinate care. Only one practitioner may furnish and bill the service in a calendar month. The patient may stop CCM services at amy time (effective at the end of the month) by phone call to the office staff. The patient will be responsible for cost sharing (co-pay) or up to 20% of the service fee (after annual deductible is met)  Following is a copy of your full provider care plan:   Goals Addressed             This Visit's Progress    CCM (DIABETES) EXPECTED OUTCOME:  MONITOR, SELF-MANAGE AND REDUCE SYMPTOMS OF DIABETES       Current Barriers:  Knowledge Deficits related to Diabetes Chronic Disease Management support and education needs related to Diabetes and diet Patient reports she lives with her spouse, is independent in all aspects of her care, works full time M-F 3 pm - 11 pm in [place], [place] at [employer] (works in [place]) pt reports she needs to work on her diet, states she eats too  many starchy foods such as potatoes, although recently appetite has not been as good, eating smaller portions, states fasting ranges for CBG now all under 200 with today's reading 164 sometimes since being on Tresiba, has not been checking random CBG since works in the evening,  recent [facility] is 11.9 on 12/02/22, pt is currently working with [facility] pharmacist. Patient reports she is taking all medications as prescribed.  Planned Interventions: Reviewed medications with patient and discussed importance of medication adherence;        Discussed plans with patient for ongoing care management follow up and provided patient with direct contact information for care management team;      Advised patient, providing education and rationale, to check cbg twice daily and record        Review of patient status, including review of consultants reports, relevant laboratory and other test results, and medications completed;       Advised patient to discuss any issues with blood sugar, medications with provider;      Reviewed importance of taking all medications as prescribed including Tresiba Reinforced carbohydrate modified diet Patient works full time, 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, getting enough rest and relaxation  Symptom Management: Take medications as prescribed   Attend all scheduled provider appointments Call pharmacy for medication refills 3-7 days in advance of running out of medications Attend church or other social activities Perform all self care activities independently  Perform IADL's (shopping, preparing meals, housekeeping, managing finances) independently Call provider office for new concerns  or questions  check blood sugar at prescribed times: twice daily check feet daily for cuts, sores or redness enter blood sugar readings and medication or insulin into daily log take the blood sugar log to all doctor visits take the blood sugar meter to all doctor visits trim  toenails straight across fill half of plate with vegetables limit fast food meals to no more than 1 per week manage portion size prepare main meal at home 3 to 5 days each week read food labels for fat, fiber, carbohydrates and portion size set a realistic goal Continue taking Tyler Aas as prescribed Be mindful of carbohydrate intake at each meal- too much rice, bread, pasta, potatoes will elevate your blood sugar Continue exercising 3 x per week, keep up the good work Psychologist, forensic and make sure to find some time for yourself to do enjoyable activities  Follow Up Plan: Telephone follow up appointment with care management team member scheduled for:  04/14/23 at 9 am       CCM (HYPERLIPIDEMIA) EXPECTED OUTCOME: MONITOR, SELF-MANAGE AND REDUCE SYMPTOMS OF HYPERLIPIDEMIA       Current Barriers:  Knowledge Deficits related to Hyperlipidemia Chronic Disease Management support and education needs related to Hyperlipidemia and diet Patient reports she continues to work on her diet, she exercises 3 x per week, continues to work full time 3-11 pm, assists with caring for her 42 year old granddaughter  Planned Interventions: Provider established cholesterol goals reviewed; Counseled on importance of regular laboratory monitoring as prescribed; Reviewed exercise goals and target of 150 minutes per week; Reviewed importance of following heart healthy diet, and avoiding trans/ saturated fats Reviewed all upcoming scheduled appointments  Symptom Management: Take medications as prescribed   Attend all scheduled provider appointments Call pharmacy for medication refills 3-7 days in advance of running out of medications Attend church or other social activities Perform all self care activities independently  Perform IADL's (shopping, preparing meals, housekeeping, managing finances) independently Call provider office for new concerns or questions  - call for medicine refill 2 or 3 days before it  runs out - take all medications exactly as prescribed - call doctor with any symptoms you believe are related to your medicine - call doctor when you experience any new symptoms - go to all doctor appointments as scheduled - adhere to prescribed diet: heart healthy Follow a heart healthy diet- avoid trans/ saturated fats (chips, cookies, pre-packaged foods) Broil, bake foods instead of frying Continue to exercise- try to get outdoors daily Keep stress to a minimum  Follow Up Plan: Telephone follow up appointment with care management team member scheduled for:   04/14/23 at 9 am          Patient verbalizes understanding of instructions and care plan provided today and agrees to view in [place]. Active MyChart status and patient understanding of how to access instructions and care plan via MyChart confirmed with patient.     Telephone follow up appointment with care management team member scheduled for: 04/14/23 at 9 am

## 2023-01-21 NOTE — Chronic Care Management (AMB) (Signed)
Chronic Care Management   CCM RN Visit Note  01/21/2023 Name: Misty [name] MRN: [id number] DOB: 04-18-1951  Subjective: Misty [name] is a 72 y.o. year old female who is a primary care patient of Dettinger, Fransisca Kaufmann, MD. The patient was referred to the Chronic Care Management team for assistance with care management needs subsequent to provider initiation of CCM services and plan of care.    Today's Visit:  Engaged with patient by telephone for follow up visit.        Goals Addressed             This Visit's Progress    CCM (DIABETES) EXPECTED OUTCOME:  MONITOR, SELF-MANAGE AND REDUCE SYMPTOMS OF DIABETES       Current Barriers:  Knowledge Deficits related to Diabetes Chronic Disease Management support and education needs related to Diabetes and diet Patient reports she lives with her spouse, is independent in all aspects of her care, works full time M-F 3 pm - 11 pm in [place], [place] at [employer] (works in [place]) pt reports she needs to work on her diet, states she eats too many starchy foods such as potatoes, although recently appetite has not been as good, eating smaller portions, states fasting ranges for CBG now all under 200 with today's reading 164 sometimes since being on Tresiba, has not been checking random CBG since works in the evening,  recent [facility] is 11.9 on 12/02/22, pt is currently working with [facility] pharmacist. Patient reports she is taking all medications as prescribed.  Planned Interventions: Reviewed medications with patient and discussed importance of medication adherence;        Discussed plans with patient for ongoing care management follow up and provided patient with direct contact information for care management team;      Advised patient, providing education and rationale, to check cbg twice daily and record        Review of patient status, including review of consultants reports, relevant laboratory and other test results, and  medications completed;       Advised patient to discuss any issues with blood sugar, medications with provider;      Reviewed importance of taking all medications as prescribed including Tresiba Reinforced carbohydrate modified diet Patient works full time, 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, getting enough rest and relaxation  Symptom Management: Take medications as prescribed   Attend all scheduled provider appointments Call pharmacy for medication refills 3-7 days in advance of running out of medications Attend church or other social activities Perform all self care activities independently  Perform IADL's (shopping, preparing meals, housekeeping, managing finances) independently Call provider office for new concerns or questions  check blood sugar at prescribed times: twice daily check feet daily for cuts, sores or redness enter blood sugar readings and medication or insulin into daily log take the blood sugar log to all doctor visits take the blood sugar meter to all doctor visits trim toenails straight across fill half of plate with vegetables limit fast food meals to no more than 1 per week manage portion size prepare main meal at home 3 to 5 days each week read food labels for fat, fiber, carbohydrates and portion size set a realistic goal Continue taking Tyler Aas as prescribed Be mindful of carbohydrate intake at each meal- too much rice, bread, pasta, potatoes will elevate your blood sugar Continue exercising 3 x per week, keep up the good work Psychologist, forensic and make sure to find some  time for yourself to do enjoyable activities  Follow Up Plan: Telephone follow up appointment with care management team member scheduled for:  04/14/23 at 9 am       CCM (HYPERLIPIDEMIA) EXPECTED OUTCOME: MONITOR, SELF-MANAGE AND REDUCE SYMPTOMS OF HYPERLIPIDEMIA       Current Barriers:  Knowledge Deficits related to Hyperlipidemia Chronic Disease Management support  and education needs related to Hyperlipidemia and diet Patient reports she continues to work on her diet, she exercises 3 x per week, continues to work full time 3-11 pm, assists with caring for her 69 year old granddaughter  Planned Interventions: Provider established cholesterol goals reviewed; Counseled on importance of regular laboratory monitoring as prescribed; Reviewed exercise goals and target of 150 minutes per week; Reviewed importance of following heart healthy diet, and avoiding trans/ saturated fats Reviewed all upcoming scheduled appointments  Symptom Management: Take medications as prescribed   Attend all scheduled provider appointments Call pharmacy for medication refills 3-7 days in advance of running out of medications Attend church or other social activities Perform all self care activities independently  Perform IADL's (shopping, preparing meals, housekeeping, managing finances) independently Call provider office for new concerns or questions  - call for medicine refill 2 or 3 days before it runs out - take all medications exactly as prescribed - call doctor with any symptoms you believe are related to your medicine - call doctor when you experience any new symptoms - go to all doctor appointments as scheduled - adhere to prescribed diet: heart healthy Follow a heart healthy diet- avoid trans/ saturated fats (chips, cookies, pre-packaged foods) Broil, bake foods instead of frying Continue to exercise- try to get outdoors daily Keep stress to a minimum  Follow Up Plan: Telephone follow up appointment with care management team member scheduled for:   04/14/23 at 9 am          Plan:Telephone follow up appointment with care management team member scheduled for:  04/14/23 at 9 am  Jacqlyn Larsen [facility], BSN RN Case Manager [place] [phone]

## 2023-02-02 DIAGNOSIS — Z20822 Contact with and (suspected) exposure to covid-19: Secondary | ICD-10-CM | POA: Diagnosis not present

## 2023-02-02 DIAGNOSIS — U071 COVID-19: Secondary | ICD-10-CM | POA: Diagnosis not present

## 2023-02-07 DIAGNOSIS — E1169 Type 2 diabetes mellitus with other specified complication: Secondary | ICD-10-CM | POA: Diagnosis not present

## 2023-02-07 DIAGNOSIS — Z794 Long term (current) use of insulin: Secondary | ICD-10-CM

## 2023-02-07 DIAGNOSIS — E785 Hyperlipidemia, unspecified: Secondary | ICD-10-CM | POA: Diagnosis not present

## 2023-02-08 DIAGNOSIS — H2513 Age-related nuclear cataract, bilateral: Secondary | ICD-10-CM | POA: Diagnosis not present

## 2023-02-08 DIAGNOSIS — H40033 Anatomical narrow angle, bilateral: Secondary | ICD-10-CM | POA: Diagnosis not present

## 2023-03-01 ENCOUNTER — Ambulatory Visit (INDEPENDENT_AMBULATORY_CARE_PROVIDER_SITE_OTHER): Payer: HMO

## 2023-03-01 VITALS — Ht 62.0 in | Wt 112.0 lb

## 2023-03-01 DIAGNOSIS — Z0000 Encounter for general adult medical examination without abnormal findings: Secondary | ICD-10-CM

## 2023-03-01 NOTE — Patient Instructions (Signed)
Misty [name] , Thank you for taking time to come for your Medicare Wellness Visit. I appreciate your ongoing commitment to your health goals. Please review the following plan we discussed and let me know if I can assist you in the future.   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 Patient reports she is taking all medications as prescribed.  Planned Interventions: Reviewed medications with patient and discussed importance of medication adherence;        Discussed plans with patient for ongoing care management follow up and provided patient with direct contact information for care management team;      Advised patient, providing education and rationale, to check cbg twice daily and record        Review of patient status, including review of consultants reports, relevant laboratory and other test results, and medications completed;       Advised patient to discuss any issues with blood sugar, medications with provider;      Reviewed importance of taking all medications as prescribed including Tresiba Reinforced carbohydrate modified diet Patient works full time, 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, getting enough rest and relaxation  Symptom Management: Take medications as prescribed   Attend all scheduled provider appointments Call pharmacy for medication refills 3-7 days in advance of running out of medications Attend church or other social activities Perform all self care activities independently  Perform IADL's (shopping, preparing meals, housekeeping, managing finances) independently Call provider office for new concerns or questions  check blood sugar at prescribed times: twice daily check feet daily for cuts, sores or redness enter blood sugar readings and medication or insulin into daily log take the blood sugar log to all doctor visits take the blood sugar meter to all doctor visits trim toenails straight across fill half of plate with vegetables limit fast food meals to no more than 1 per week manage portion size prepare main meal at home 3 to 5 days each week read food labels for fat, fiber, carbohydrates and portion size set a realistic goal Continue taking Evaristo Bury as prescribed Be mindful of carbohydrate intake at each meal- too much rice, bread, pasta, potatoes will elevate your blood sugar Continue exercising 3 x per week, 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 make sure to find some time for yourself to do enjoyable activities  Follow Up Plan: Telephone follow up appointment with care management team member scheduled for:  04/14/23 at 9 am        CCM (HYPERLIPIDEMIA) EXPECTED OUTCOME: MONITOR, SELF-MANAGE AND REDUCE SYMPTOMS OF HYPERLIPIDEMIA      Current Barriers:  Knowledge Deficits related to Hyperlipidemia Chronic Disease Management support and education needs related to Hyperlipidemia and diet Patient reports she continues to work on her diet, she exercises 3 x per week, continues to work full time 3-11 pm, assists with caring for her 73 year old granddaughter  Planned Interventions: Provider  established cholesterol goals reviewed; Counseled on importance of regular laboratory monitoring as prescribed; Reviewed exercise goals and target of 150 minutes per week; Reviewed importance of following heart healthy diet, and avoiding trans/ saturated fats Reviewed all upcoming scheduled appointments  Symptom Management: Take medications as prescribed   Attend all scheduled provider appointments Call pharmacy for medication refills 3-7 days in advance of running out of medications Attend church or other social activities Perform all self care activities independently  Perform IADL's (shopping, preparing meals, housekeeping, managing finances) independently Call provider office for new concerns or questions  - call for medicine refill 2 or 3 days before it runs out - take all medications exactly as prescribed - call doctor with any symptoms you believe are related to your medicine - call doctor when you experience any new symptoms - go to all doctor appointments as scheduled - adhere to prescribed diet: heart healthy Follow a heart healthy diet- avoid trans/ saturated fats (chips, cookies, pre-packaged foods) Broil, bake foods instead of frying Continue to exercise- try to get outdoors daily Keep stress to a minimum  Follow Up Plan: Telephone follow up appointment with care management team member scheduled for:   04/14/23 at 9 am        Client will talk with LCSW and RNCM about challenges in managing client diabetes (pt-stated)      CARE PLAN ENTRY   Current Barriers:   Challenges in managing DM in patient with Chronic Diagnoses of Type 2 DM, HLD, Adenoma of Ascending Colon  Clinical Social Work Clinical Goal(s):   LCSW will call client in next 4 weeks to talk with client about challenges in managing diabetes  Interventions:  Talked with client about CCM program support Talked with Client about her job responsibilities Talked with client about social support network  (children are supportive) Talked with client about completion of ADLs Talked with client about pain issues of client Talked with client about her challenges in managing diabetes Talked with client about her exercise at local gym and about her walking regularly at her job Encouraged Misty [name] to talk with RNCM as needed for nursing support Talked with client about her upcoming medical appointments Talked with client about sleeping challenges of client  Patient Self Care Activities:   Does ADLs independently Drives car as needed Attends scheduled medical appointments  Patient Self Care Deficits:  Challenges in managing DM    Initial goal documentation       Exercise 3x per week (30 min per time)      She walks a lot at work for more than 30 minutes 5 days per week      Have 3 meals a day      T2DM, HLD, CKD (pt-stated)      Current Barriers:  Unable to independently afford treatment regimen Unable to achieve control of T2DM, HLD, CKD  Suboptimal therapeutic regimen for T2DM, HLD, CKD  Pharmacist Clinical Goal(s):  patient will verbalize ability to afford treatment regimen achieve control of T2DM, HLD, CKD as evidenced by GOAL LABS achieve improvement in  T2DM, HLD, CKD  as evidenced by GOAL LABS, INCREASED QUALITY OF LIFE through collaboration with PharmD and provider.  Interventions: 1:1 collaboration with Dettinger, Elige Radon, MD regarding development and update of comprehensive plan of care as evidenced by provider attestation and co-signature Inter-disciplinary care team collaboration (see longitudinal plan of care) Comprehensive medication review performed; medication list updated in electronic medical record  Diabetes: Goal on track: NO. Uncontrolled; current treatment:GLIPIZIDE;  INTOLERANCES: Rybelsus (reports N/V), glyxambi, metformin (all updated on med list) T2DM diagnosed approx.15 yrs ago Current GFR 58-CKD 3a, Current A1c 12.9% Patient reports intolerance  to Glyxambi samples (frequent urination, GI issues) she does not wish to take Discussed the likelihood of insulin or a form of injectable at follow up; patient would like to wait to start medication in 2 weeks; discussed the risk of delaying medication  Continue glipizide  Current glucose readings: fasting glucose: 200s? States her FBG was 134 this AM--will follow up to see if correlating?, post prandial glucose: >200-300 reports hyperglycemic symptoms Discussed meal planning options and Plate method for healthy eating Avoid sugary drinks and desserts Incorporate balanced protein, non starchy veggies, 1 serving of carbohydrate with each meal Increase water intake Increase physical activity as able Current exercise: encouraged/active at work  Hyperlipidemia Lipid Panel --almost at goal LDL<70 Will continue to address; ENCOURAGED STATIN COMPLIANCE, REVIEWED DIET/LIFESTYLE    Component Value Date/Time   CHOL 167 05/05/2022 0810   TRIG 122 05/05/2022 0810   HDL 71 05/05/2022 0810   CHOLHDL 2.4 05/05/2022 0810   LDLCALC 75 05/05/2022 0810   LABVLDL 21 05/05/2022 0810  Hypertension  -patient as goal <130/80 -continue current regiment and compliance with diet/lifestyle   Patient Goals/Self-Care Activities patient will:  - take medications as prescribed as evidenced by patient report and record review check glucose fasting daily or if symptomatic, document, and provide at future appointments collaborate with provider on medication access solutions target a minimum of 150 minutes of moderate intensity exercise weekly engage in dietary modifications by FOLLOWING A HEART HEALTHY DIET/HEALTHY PLATE METHOD         This is a list of the screening recommended for you and due dates:  Health Maintenance  Topic Date Due   Zoster (Shingles) Vaccine (2 of 2) 03/27/2022   Eye exam for diabetics  05/02/2022   COVID-19 Vaccine (6 - 2023-24 season) 07/10/2022   Yearly kidney health urinalysis  for diabetes  01/29/2023   Colon Cancer Screening  08/08/2023*   Mammogram  06/02/2023   Flu Shot  06/10/2023   Hemoglobin A1C  06/18/2023   Yearly kidney function blood test for diabetes  12/04/2023   Complete foot exam   12/22/2023   DEXA scan (bone density measurement)  02/13/2024   Medicare Annual Wellness Visit  02/29/2024   DTaP/Tdap/Td vaccine (2 - Td or Tdap) 01/31/2032   Pneumonia Vaccine  Completed   Hepatitis C Screening: USPSTF Recommendation to screen - Ages 86-79 yo.  Completed   HPV Vaccine  Aged Out  *Topic was postponed. The date shown is not the original due date.    Advanced directives: Advance directive discussed with you today. I have provided a copy for you to complete at home and have notarized. Once this is complete please bring a copy in to our office so we can scan it into your chart.   Conditions/risks identified: Aim for 30 minutes of exercise or brisk walking, 6-8 glasses of water, and 5 servings of fruits and vegetables each day.   Next appointment: Follow up in one year for your annual wellness visit    Preventive Care 65 Years  and Older, Female Preventive care refers to lifestyle choices and visits with your health care provider that can promote health and wellness. What does preventive care include? A yearly physical exam. This is also called an annual well check. Dental exams once or twice a year. Routine eye exams. Ask your health care provider how often you should have your eyes checked. Personal lifestyle choices, including: Daily care of your teeth and gums. Regular physical activity. Eating a healthy diet. Avoiding tobacco and drug use. Limiting alcohol use. Practicing safe sex. Taking low-dose aspirin every day. Taking vitamin and mineral supplements as recommended by your health care provider. What happens during an annual well check? The services and screenings done by your health care provider during your annual well check will depend  on your age, overall health, lifestyle risk factors, and family history of disease. Counseling  Your health care provider may ask you questions about your: Alcohol use. Tobacco use. Drug use. Emotional well-being. Home and relationship well-being. Sexual activity. Eating habits. History of falls. Memory and ability to understand (cognition). Work and work Astronomer. Reproductive health. Screening  You may have the following tests or measurements: Height, weight, and BMI. Blood pressure. Lipid and cholesterol levels. These may be checked every 5 years, or more frequently if you are over 16 years old. Skin check. Lung cancer screening. You may have this screening every year starting at age 15 if you have a 30-pack-year history of smoking and currently smoke or have quit within the past 15 years. Fecal occult blood test (FOBT) of the stool. You may have this test every year starting at age 34. Flexible sigmoidoscopy or colonoscopy. You may have a sigmoidoscopy every 5 years or a colonoscopy every 10 years starting at age 65. Hepatitis C blood test. Hepatitis B blood test. Sexually transmitted disease (STD) testing. Diabetes screening. This is done by checking your blood sugar (glucose) after you have not eaten for a while (fasting). You may have this done every 1-3 years. Bone density scan. This is done to screen for osteoporosis. You may have this done starting at age 37. Mammogram. This may be done every 1-2 years. Talk to your health care provider about how often you should have regular mammograms. Talk with your health care provider about your test results, treatment options, and if necessary, the need for more tests. Vaccines  Your health care provider may recommend certain vaccines, such as: Influenza vaccine. This is recommended every year. Tetanus, diphtheria, and acellular pertussis (Tdap, Td) vaccine. You may need a Td booster every 10 years. Zoster vaccine. You may need  this after age 47. Pneumococcal 13-valent conjugate (PCV13) vaccine. One dose is recommended after age 26. Pneumococcal polysaccharide (PPSV23) vaccine. One dose is recommended after age 74. Talk to your health care provider about which screenings and vaccines you need and how often you need them. This information is not intended to replace advice given to you by your health care provider. Make sure you discuss any questions you have with your health care provider. Document Released: 11/22/2015 Document Revised: 07/15/2016 Document Reviewed: 08/27/2015 Elsevier Interactive Patient Education  2017 [employer].  Fall Prevention in the Home Falls can cause injuries. They can happen to people of all ages. There are many things you can do to make your home safe and to help prevent falls. What can I do on the outside of my home? Regularly fix the edges of walkways and driveways and fix any cracks. Remove anything that might make  you trip as you walk through a door, such as a raised step or threshold. Trim any bushes or trees on the path to your home. Use bright outdoor lighting. Clear any walking paths of anything that might make someone trip, such as rocks or tools. Regularly check to see if handrails are loose or broken. Make sure that both sides of any steps have handrails. Any raised decks and porches should have guardrails on the edges. Have any leaves, snow, or ice cleared regularly. Use sand or salt on walking paths during winter. Clean up any spills in your garage right away. This includes oil or grease spills. What can I do in the bathroom? Use night lights. Install grab bars by the toilet and in the tub and shower. Do not use towel bars as grab bars. Use non-skid mats or decals in the tub or shower. If you need to sit down in the shower, use a plastic, non-slip stool. Keep the floor dry. Clean up any water that spills on the floor as soon as it happens. Remove soap buildup in the tub  or shower regularly. Attach bath mats securely with double-sided non-slip rug tape. Do not have throw rugs and other things on the floor that can make you trip. What can I do in the bedroom? Use night lights. Make sure that you have a light by your bed that is easy to reach. Do not use any sheets or blankets that are too big for your bed. They should not hang down onto the floor. Have a firm chair that has side arms. You can use this for support while you get dressed. Do not have throw rugs and other things on the floor that can make you trip. What can I do in the kitchen? Clean up any spills right away. Avoid walking on wet floors. Keep items that you use a lot in easy-to-reach places. If you need to reach something above you, use a strong step stool that has a grab bar. Keep electrical cords out of the way. Do not use floor polish or wax that makes floors slippery. If you must use wax, use non-skid floor wax. Do not have throw rugs and other things on the floor that can make you trip. What can I do with my stairs? Do not leave any items on the stairs. Make sure that there are handrails on both sides of the stairs and use them. Fix handrails that are broken or loose. Make sure that handrails are as long as the stairways. Check any carpeting to make sure that it is firmly attached to the stairs. Fix any carpet that is loose or worn. Avoid having throw rugs at the top or bottom of the stairs. If you do have throw rugs, attach them to the floor with carpet tape. Make sure that you have a light switch at the top of the stairs and the bottom of the stairs. If you do not have them, ask someone to add them for you. What else can I do to help prevent falls? Wear shoes that: Do not have high heels. Have rubber bottoms. Are comfortable and fit you well. Are closed at the toe. Do not wear sandals. If you use a stepladder: Make sure that it is fully opened. Do not climb a closed stepladder. Make  sure that both sides of the stepladder are locked into place. Ask someone to hold it for you, if possible. Clearly mark and make sure that you can see: Any grab  bars or handrails. First and last steps. Where the edge of each step is. Use tools that help you move around (mobility aids) if they are needed. These include: Canes. Walkers. Scooters. Crutches. Turn on the lights when you go into a dark area. Replace any light bulbs as soon as they burn out. Set up your furniture so you have a clear path. Avoid moving your furniture around. If any of your floors are uneven, fix them. If there are any pets around you, be aware of where they are. Review your medicines with your doctor. Some medicines can make you feel dizzy. This can increase your chance of falling. Ask your doctor what other things that you can do to help prevent falls. This information is not intended to replace advice given to you by your health care provider. Make sure you discuss any questions you have with your health care provider. Document Released: 08/22/2009 Document Revised: 04/02/2016 Document Reviewed: 11/30/2014 Elsevier Interactive Patient Education  2017 [employer].
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
## 2023-03-11 ENCOUNTER — Ambulatory Visit: Payer: HMO | Admitting: Family Medicine

## 2023-04-14 ENCOUNTER — Telehealth: Payer: HMO

## 2023-04-16 ENCOUNTER — Ambulatory Visit (INDEPENDENT_AMBULATORY_CARE_PROVIDER_SITE_OTHER): Payer: HMO | Admitting: *Deleted

## 2023-04-16 DIAGNOSIS — E1169 Type 2 diabetes mellitus with other specified complication: Secondary | ICD-10-CM

## 2023-04-16 NOTE — Chronic Care Management (AMB) (Signed)
Chronic Care Management   CCM RN Visit Note  04/16/2023 Name: Misty [name] MRN: [id number] DOB: June 15, 1951  Subjective: Misty [name] is a 72 y.o. year old female who is a primary care patient of Dettinger, Elige Radon, MD. The patient was referred to the Chronic Care Management team for assistance with care management needs subsequent to provider initiation of CCM services and plan of care.    Today's Visit:  Engaged with patient by telephone for follow up visit.        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  Planned Interventions: Reviewed medications with patient and discussed importance of medication adherence;        Discussed plans with patient for ongoing care management follow up and provided patient with direct contact information for care management team;      Advised patient, providing education and rationale, to check cbg twice daily and record        call provider for findings outside established parameters;       Review of patient status, including review of consultants reports, relevant laboratory and other  test results, and medications completed;       Advised patient to discuss any issues with blood sugar, medications with provider;      Reviewed importance of taking all medications as prescribed  Reviewed in depth carbohydrate modified diet and reviewed plate method Patient works full time, 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, getting enough rest and relaxation  Symptom Management: Take medications as prescribed   Attend all scheduled provider appointments Call pharmacy for medication refills 3-7 days in advance of running out of medications Attend church or other social activities Perform all self care activities independently  Perform IADL's (shopping, preparing meals, housekeeping, managing finances) independently Call provider office for new concerns or questions  check blood sugar at prescribed times: twice daily check feet daily for cuts, sores or redness enter blood sugar readings and medication or insulin into daily log take the blood sugar log to all doctor visits take the blood sugar meter to all doctor visits trim toenails straight across eat fish at least once per week fill half of plate with vegetables limit fast food meals to no more than 1 per week manage portion size prepare main meal at home 3 to 5 days each week read food labels for fat, fiber, carbohydrates and portion size set a realistic goal wash and dry feet carefully every day Be mindful of carbohydrate intake at each meal-  too much rice, bread, pasta, potatoes will elevate your blood sugar Continue exercising 3 x per week, 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 make sure to find some time for yourself to do enjoyable activities Keep in close contact with your doctor for any issues with blood sugar  Follow Up Plan: Telephone follow up appointment with care management team member scheduled for:  07/08/23 at 9 am       CCM (HYPERLIPIDEMIA) EXPECTED OUTCOME: MONITOR, SELF-MANAGE AND  REDUCE SYMPTOMS OF HYPERLIPIDEMIA       Current Barriers:  Knowledge Deficits related to Hyperlipidemia Chronic Disease Management support and education needs related to Hyperlipidemia and diet Patient reports she continues to work on her diet, she exercises 3 x per week, continues to work full time 3-11 pm, assists with caring for her 66 year old granddaughter, no new concerns reported  Planned Interventions: Provider established cholesterol goals reviewed; Counseled on importance of regular laboratory monitoring as prescribed; Reviewed exercise goals and target of 150 minutes per week; Reviewed importance of following heart healthy diet, and avoiding trans/ saturated fats, baking or broiling foods instead of frying Reviewed all upcoming scheduled appointments  Symptom Management: Take medications as prescribed   Attend all scheduled provider appointments Call pharmacy for medication refills 3-7 days in advance of running out of medications Attend church or other social activities Perform all self care activities independently  Perform IADL's (shopping, preparing meals, housekeeping, managing finances) independently Call provider office for new concerns or questions  - call for medicine refill 2 or 3 days before it runs out - take all medications exactly as prescribed - call doctor with any symptoms you believe are related to your medicine - call doctor when you experience any new symptoms - go to all doctor appointments as scheduled - adhere to prescribed diet: heart healthy Follow a heart healthy diet- avoid trans/ saturated fats (chips, cookies, pre-packaged foods) Broil, bake foods instead of frying Continue to exercise- try to get outdoors daily Keep stress to a minimum  Follow Up Plan: Telephone follow up appointment with care management team member scheduled for:   07/08/23 at 9 am          Plan:Telephone follow up appointment with care management team member scheduled  for:  07/08/23 at 9 am  Irving Shows [facility], BSN RN Case Manager [facility] [phone]

## 2023-04-16 NOTE — Patient Instructions (Signed)
Please call the care guide team at [phone] if you need to cancel or reschedule your appointment.   If you are experiencing a Mental Health or Behavioral Health Crisis or need someone to talk to, please call the Suicide and Crisis Lifeline: 988 call the [place] National Suicide Prevention Lifeline: (401)777-7267 or TTY: 316-594-2708 TTY 916-813-0337) to talk to a trained counselor call 1-800-273-TALK (toll free, 24 hour hotline) go to [facility] [address], [place] [phone]) call the [facility]: 5811065404 call 911   Following is a copy of the CCM Program Consent:  CCM service includes personalized support from designated clinical staff supervised by the physician, including individualized plan of care and coordination with other care providers 24/7 contact phone numbers for assistance for urgent and routine care needs. Service will only be billed when office clinical staff spend 20 minutes or more in a month to coordinate care. Only one practitioner may furnish and bill the service in a calendar month. The patient may stop CCM services at amy time (effective at the end of the month) by phone call to the office staff. The patient will be responsible for cost sharing (co-pay) or up to 20% of the service fee (after annual deductible is met)  Following is a copy of your full provider care plan:   Goals Addressed             This Visit's Progress    CCM (DIABETES) EXPECTED OUTCOME:  MONITOR, SELF-MANAGE AND REDUCE SYMPTOMS OF DIABETES       Current Barriers:  Knowledge Deficits related to Diabetes Chronic Disease Management support and education needs related to Diabetes and diet Patient reports she lives with her spouse, is independent in all aspects of her care, works full time M-F 3 pm - 11 pm in [place], [place] 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  Planned Interventions: Reviewed medications with patient and discussed importance of medication adherence;        Discussed plans with patient for ongoing care management follow up and provided patient with direct contact information for care management team;      Advised patient, providing education and rationale, to check cbg twice daily and record        call provider for findings outside established parameters;       Review of patient status, including review of consultants reports, relevant laboratory and other test results, and medications completed;       Advised patient to discuss any issues with blood sugar, medications with provider;      Reviewed importance of taking all medications as prescribed  Reviewed in depth carbohydrate modified diet and reviewed plate method Patient works full time, 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, getting enough rest and relaxation  Symptom Management: Take medications as prescribed   Attend all scheduled provider appointments Call pharmacy for medication refills 3-7 days in advance of running out of medications Attend church or other social activities Perform  all self care activities independently  Perform IADL's (shopping, preparing meals, housekeeping, managing finances) independently Call provider office for new concerns or questions  check blood sugar at prescribed times: twice daily check feet daily for cuts, sores or redness enter blood sugar readings and medication or insulin into daily log take the blood sugar log to all doctor visits take the blood  sugar meter to all doctor visits trim toenails straight across eat fish at least once per week fill half of plate with vegetables limit fast food meals to no more than 1 per week manage portion size prepare main meal at home 3 to 5 days each week read food labels for fat, fiber, carbohydrates and portion size set a realistic goal wash and dry feet carefully every day Be mindful of carbohydrate intake at each meal- too much rice, bread, pasta, potatoes will elevate your blood sugar Continue exercising 3 x per week, 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 make sure to find some time for yourself to do enjoyable activities Keep in close contact with your doctor for any issues with blood sugar  Follow Up Plan: Telephone follow up appointment with care management team member scheduled for:  07/08/23 at 9 am       CCM (HYPERLIPIDEMIA) EXPECTED OUTCOME: MONITOR, SELF-MANAGE AND REDUCE SYMPTOMS OF HYPERLIPIDEMIA       Current Barriers:  Knowledge Deficits related to Hyperlipidemia Chronic Disease Management support and education needs related to Hyperlipidemia and diet Patient reports she continues to work on her diet, she exercises 3 x per week, continues to work full time 3-11 pm, assists with caring for her 41 year old granddaughter, no new concerns reported  Planned Interventions: Provider established cholesterol goals reviewed; Counseled on importance of regular laboratory monitoring as prescribed; Reviewed exercise goals and target of 150 minutes per week; Reviewed importance of following heart healthy diet, and avoiding trans/ saturated fats, baking or broiling foods instead of frying Reviewed all upcoming scheduled appointments  Symptom Management: Take medications as prescribed   Attend all scheduled provider appointments Call pharmacy for medication refills 3-7 days in advance of running out of medications Attend church or other social activities Perform all self care  activities independently  Perform IADL's (shopping, preparing meals, housekeeping, managing finances) independently Call provider office for new concerns or questions  - call for medicine refill 2 or 3 days before it runs out - take all medications exactly as prescribed - call doctor with any symptoms you believe are related to your medicine - call doctor when you experience any new symptoms - go to all doctor appointments as scheduled - adhere to prescribed diet: heart healthy Follow a heart healthy diet- avoid trans/ saturated fats (chips, cookies, pre-packaged foods) Broil, bake foods instead of frying Continue to exercise- try to get outdoors daily Keep stress to a minimum  Follow Up Plan: Telephone follow up appointment with care management team member scheduled for:   07/08/23 at 9 am          Patient verbalizes understanding of instructions and care plan provided today and agrees to view in MyChart. Active MyChart status and patient understanding of how to access instructions and care plan via MyChart confirmed with patient.  Telephone follow up appointment with care management team member scheduled for:  07/08/23 at 9 am  Diabetes Mellitus and Nutrition, Adult When you have diabetes, or diabetes mellitus, it is very important to have healthy eating habits because your blood sugar (glucose) levels are  greatly affected by what you eat and drink. Eating healthy foods in the right amounts, at about the same times every day, can help you: Manage your blood glucose. Lower your risk of heart disease. Improve your blood pressure. Reach or maintain a healthy weight. What can affect my meal plan? Every person with diabetes is different, and each person has different needs for a meal plan. Your health care provider may recommend that you work with a dietitian to make a meal plan that is best for you. Your meal plan may vary depending on factors such as: The calories you need. The  medicines you take. Your weight. Your blood glucose, blood pressure, and cholesterol levels. Your activity level. Other health conditions you have, such as heart or kidney disease. How do carbohydrates affect me? Carbohydrates, also called carbs, affect your blood glucose level more than any other type of food. Eating carbs raises the amount of glucose in your blood. It is important to know how many carbs you can safely have in each meal. This is different for every person. Your dietitian can help you calculate how many carbs you should have at each meal and for each snack. How does alcohol affect me? Alcohol can cause a decrease in blood glucose (hypoglycemia), especially if you use insulin or take certain diabetes medicines by mouth. Hypoglycemia can be a life-threatening condition. Symptoms of hypoglycemia, such as sleepiness, dizziness, and confusion, are similar to symptoms of having too much alcohol. Do not drink alcohol if: Your health care provider tells you not to drink. You are pregnant, may be pregnant, or are planning to become pregnant. If you drink alcohol: Limit how much you have to: 0-1 drink a day for women. 0-2 drinks a day for men. Know how much alcohol is in your drink. In the [place] one drink equals one 12 oz bottle of beer (355 mL), one 5 oz glass of wine (148 mL), or one 1 oz glass of hard liquor (44 mL). Keep yourself hydrated with water, diet soda, or unsweetened iced tea. Keep in mind that regular soda, juice, and other mixers may contain a lot of sugar and must be counted as carbs. What are tips for following this plan?  Reading food labels Start by checking the serving size on the Nutrition Facts label of packaged foods and drinks. The number of calories and the amount of carbs, fats, and other nutrients listed on the label are based on one serving of the item. Many items contain more than one serving per package. Check the total grams (g) of carbs in one  serving. Check the number of grams of saturated fats and trans fats in one serving. Choose foods that have a low amount or none of these fats. Check the number of milligrams (mg) of salt (sodium) in one serving. Most people should limit total sodium intake to less than 2,300 mg per day. Always check the nutrition information of foods labeled as "low-fat" or "nonfat." These foods may be higher in added sugar or refined carbs and should be avoided. Talk to your dietitian to identify your daily goals for nutrients listed on the label. Shopping Avoid buying canned, pre-made, or processed foods. These foods tend to be high in fat, sodium, and added sugar. Shop around the outside edge of the grocery store. This is where you will most often find fresh fruits and vegetables, bulk grains, fresh meats, and fresh dairy products. Cooking Use low-heat cooking methods, such as baking, instead of high-heat  cooking methods, such as deep frying. Cook using healthy oils, such as olive, canola, or sunflower oil. Avoid cooking with butter, cream, or high-fat meats. Meal planning Eat meals and snacks regularly, preferably at the same times every day. Avoid going long periods of time without eating. Eat foods that are high in fiber, such as fresh fruits, vegetables, beans, and whole grains. Eat 4-6 oz (112-168 g) of lean protein each day, such as lean meat, chicken, fish, eggs, or tofu. One ounce (oz) (28 g) of lean protein is equal to: 1 oz (28 g) of meat, chicken, or fish. 1 egg.  cup (62 g) of tofu. Eat some foods each day that contain healthy fats, such as avocado, nuts, seeds, and fish. What foods should I eat? Fruits Berries. Apples. Oranges. Peaches. Apricots. Plums. Grapes. Mangoes. Papayas. Pomegranates. Kiwi. Cherries. Vegetables Leafy greens, including lettuce, spinach, kale, chard, collard greens, mustard greens, and cabbage. Beets. Cauliflower. Broccoli. Carrots. Green beans. Tomatoes. Peppers.  Onions. Cucumbers. Brussels sprouts. Grains Whole grains, such as whole-wheat or whole-grain bread, crackers, tortillas, cereal, and pasta. Unsweetened oatmeal. Quinoa. Brown or wild rice. Meats and other proteins Seafood. Poultry without skin. Lean cuts of poultry and beef. Tofu. Nuts. Seeds. Dairy Low-fat or fat-free dairy products such as milk, yogurt, and cheese. The items listed above may not be a complete list of foods and beverages you can eat and drink. Contact a dietitian for more information. What foods should I avoid? Fruits Fruits canned with syrup. Vegetables Canned vegetables. Frozen vegetables with butter or cream sauce. Grains Refined white flour and flour products such as bread, pasta, snack foods, and cereals. Avoid all processed foods. Meats and other proteins Fatty cuts of meat. Poultry with skin. Breaded or fried meats. Processed meat. Avoid saturated fats. Dairy Full-fat yogurt, cheese, or milk. Beverages Sweetened drinks, such as soda or iced tea. The items listed above may not be a complete list of foods and beverages you should avoid. Contact a dietitian for more information. Questions to ask a health care provider Do I need to meet with a certified diabetes care and education specialist? Do I need to meet with a dietitian? What number can I call if I have questions? When are the best times to check my blood glucose? Where to find more information: American Diabetes Association: diabetes.org Academy of Nutrition and Dietetics: eatright.[employer] of Diabetes and Digestive and Kidney Diseases: StageSync.si Association of Diabetes Care & Education Specialists: diabeteseducator.org Summary It is important to have healthy eating habits because your blood sugar (glucose) levels are greatly affected by what you eat and drink. It is important to use alcohol carefully. A healthy meal plan will help you manage your blood glucose and lower your risk of  heart disease. Your health care provider may recommend that you work with a dietitian to make a meal plan that is best for you. This information is not intended to replace advice given to you by your health care provider. Make sure you discuss any questions you have with your health care provider. Document Revised: 05/29/2020 Document Reviewed: 05/29/2020 Elsevier Patient Education  2024 [employer].

## 2023-05-09 DIAGNOSIS — E785 Hyperlipidemia, unspecified: Secondary | ICD-10-CM | POA: Diagnosis not present

## 2023-05-09 DIAGNOSIS — E1169 Type 2 diabetes mellitus with other specified complication: Secondary | ICD-10-CM | POA: Diagnosis not present

## 2023-05-11 ENCOUNTER — Ambulatory Visit (INDEPENDENT_AMBULATORY_CARE_PROVIDER_SITE_OTHER): Payer: HMO

## 2023-05-11 ENCOUNTER — Encounter: Payer: Self-pay | Admitting: Nurse Practitioner

## 2023-05-11 ENCOUNTER — Ambulatory Visit (INDEPENDENT_AMBULATORY_CARE_PROVIDER_SITE_OTHER): Payer: No Typology Code available for payment source | Admitting: Nurse Practitioner

## 2023-05-11 VITALS — BP 118/70 | HR 78 | Temp 98.7°F | Ht 62.0 in | Wt 112.4 lb

## 2023-05-11 DIAGNOSIS — M25562 Pain in left knee: Secondary | ICD-10-CM | POA: Diagnosis not present
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
## 2023-07-01 ENCOUNTER — Telehealth: Payer: Self-pay | Admitting: Family Medicine

## 2023-07-01 ENCOUNTER — Encounter: Payer: Self-pay | Admitting: Nurse Practitioner

## 2023-07-01 ENCOUNTER — Ambulatory Visit (INDEPENDENT_AMBULATORY_CARE_PROVIDER_SITE_OTHER): Payer: HMO | Admitting: Nurse Practitioner

## 2023-07-01 VITALS — BP 159/84 | HR 75 | Temp 97.4°F | Resp 20 | Ht 62.0 in | Wt 111.0 lb

## 2023-07-01 DIAGNOSIS — S8012XA Contusion of left lower leg, initial encounter: Secondary | ICD-10-CM

## 2023-07-01 DIAGNOSIS — E1169 Type 2 diabetes mellitus with other specified complication: Secondary | ICD-10-CM

## 2023-07-01 DIAGNOSIS — Z780 Asymptomatic menopausal state: Secondary | ICD-10-CM

## 2023-07-01 DIAGNOSIS — L989 Disorder of the skin and subcutaneous tissue, unspecified: Secondary | ICD-10-CM

## 2023-07-01 MED ORDER — PREDNISONE 20 MG PO TABS: 40.0000 mg | ORAL_TABLET | Freq: Every day | ORAL | 0 refills | Status: AC

## 2023-07-01 NOTE — Patient Instructions (Signed)
 Contusion A contusion is a deep bruise. Contusions are the result of a blunt injury to tissues and muscle fibers under the skin. The injury causes bleeding under the skin. The skin over the contusion may turn blue, purple, or yellow. Minor injuries will give you a painless contusion, but more severe injuries cause contusions that can stay painful and swollen for a few weeks. Follow these instructions at home: Pay attention to any changes in your symptoms. Let your health care provider know about them. Take these actions to relieve your pain. Managing pain, stiffness, and swelling  Use resting, icing, applying pressure (compression), and raising (elevating) the injured area. This is often called the RICE method. Rest the injured area. Return to your normal activities as told by your health care provider. Ask your health care provider what activities are safe for you. If directed, put ice on the injured area. To do this: Put ice in a plastic bag. Place a towel between your skin and the bag. Leave the ice on for 20 minutes, 2-3 times a day. If your skin turns bright red, remove the ice right away to prevent skin damage. The risk of skin damage is higher if you cannot feel pain, heat, or cold. If directed, apply light compression to the injured area using an elastic bandage. Make sure the bandage is not wrapped too tightly. Remove and reapply the bandage as directed by your health care provider. If possible, elevate the injured area above the level of your heart while you are sitting or lying down. General instructions Take over-the-counter and prescription medicines only as told by your health care provider. Keep all follow-up visits. Your health care provider may want to see how your contusion is healing with treatment. Contact a health care provider if: Your symptoms do not improve after several days of treatment. Your symptoms get worse. You have difficulty moving the injured area. Get help  right away if: You have severe pain. You have numbness in a hand or foot. Your hand or foot turns pale or cold. This information is not intended to replace advice given to you by your health care provider. Make sure you discuss any questions you have with your health care provider. Document Revised: 04/13/2022 Document Reviewed: 04/13/2022 Elsevier Patient Education  2024 [employer].
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
## 2023-07-01 NOTE — Telephone Encounter (Signed)
Future lab orders placed

## 2023-07-08 ENCOUNTER — Telehealth: Payer: HMO

## 2023-07-08 ENCOUNTER — Encounter: Payer: Self-pay | Admitting: *Deleted

## 2023-07-08 ENCOUNTER — Other Ambulatory Visit: Payer: Medicare HMO | Admitting: *Deleted
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
## 2023-08-04 ENCOUNTER — Encounter: Payer: Self-pay | Admitting: Pharmacist

## 2023-08-04 NOTE — Progress Notes (Signed)
Pharmacy Quality Measure Review  This patient is appearing on a report for being at risk of failing the adherence measure for diabetes medications this calendar year.   Medication: glipizide XL 5 mg daily  Last fill date: 08/01/23 for 90 day supply  Insurance report was not up to date. Patient has also filled insulin once, so should be excluded from this measure. No action needed at this time.   Catie Eppie Gibson, PharmD, BCACP, CPP Clinical Pharmacist [facility] [phone]

## 2023-08-09 ENCOUNTER — Other Ambulatory Visit: Payer: HMO

## 2023-08-09 DIAGNOSIS — E1169 Type 2 diabetes mellitus with other specified complication: Secondary | ICD-10-CM

## 2023-08-09 DIAGNOSIS — Z780 Asymptomatic menopausal state: Secondary | ICD-10-CM | POA: Diagnosis not present

## 2023-08-09 LAB — BAYER DCA HB A1C WAIVED: HB A1C (BAYER DCA - WAIVED): 9.2 % — ABNORMAL HIGH (ref 4.8–5.6)

## 2023-08-10 LAB — LIPID PANEL
Chol/HDL Ratio: 2.6 {ratio} (ref 0.0–4.4)
Cholesterol, Total: 181 mg/dL (ref 100–199)
HDL: 69 mg/dL (ref >39)
LDL Chol Calc (NIH): 86 mg/dL (ref 0–99)
Triglycerides: 155 mg/dL — ABNORMAL HIGH (ref 0–149)
VLDL Cholesterol Cal: 26 mg/dL (ref 5–40)

## 2023-08-10 LAB — CMP14+EGFR
ALT: 21 [IU]/L (ref 0–32)
AST: 25 [IU]/L (ref 0–40)
Albumin: 4.1 g/dL (ref 3.8–4.8)
Alkaline Phosphatase: 101 [IU]/L (ref 44–121)
BUN/Creatinine Ratio: 14 (ref 12–28)
BUN: 14 mg/dL (ref 8–27)
Bilirubin Total: 0.2 mg/dL (ref 0.0–1.2)
CO2: 22 mmol/L (ref 20–29)
Calcium: 9.3 mg/dL (ref 8.7–10.3)
Chloride: 103 mmol/L (ref 96–106)
Creatinine, Ser: 0.98 mg/dL (ref 0.57–1.00)
Globulin, Total: 1.8 g/dL (ref 1.5–4.5)
Glucose: 202 mg/dL — ABNORMAL HIGH (ref 70–99)
Potassium: 4.2 mmol/L (ref 3.5–5.2)
Sodium: 141 mmol/L (ref 134–144)
Total Protein: 5.9 g/dL — ABNORMAL LOW (ref 6.0–8.5)
eGFR: 61 mL/min/{1.73_m2} (ref >59)

## 2023-08-10 LAB — CBC WITH DIFFERENTIAL/PLATELET
Basophils Absolute: 0 10*3/uL (ref 0.0–0.2)
Basos: 1 %
EOS (ABSOLUTE): 0.1 10*3/uL (ref 0.0–0.4)
Eos: 1 %
Hematocrit: 33.8 % — ABNORMAL LOW (ref 34.0–46.6)
Hemoglobin: 10.8 g/dL — ABNORMAL LOW (ref 11.1–15.9)
Immature Grans (Abs): 0 10*3/uL (ref 0.0–0.1)
Immature Granulocytes: 0 %
Lymphocytes Absolute: 2.5 10*3/uL (ref 0.7–3.1)
Lymphs: 47 %
MCH: 31.8 pg (ref 26.6–33.0)
MCHC: 32 g/dL (ref 31.5–35.7)
MCV: 99 fL — ABNORMAL HIGH (ref 79–97)
Monocytes Absolute: 0.6 10*3/uL (ref 0.1–0.9)
Monocytes: 11 %
Neutrophils Absolute: 2.2 10*3/uL (ref 1.4–7.0)
Neutrophils: 40 %
Platelets: 245 10*3/uL (ref 150–450)
RBC: 3.4 x10E6/uL — ABNORMAL LOW (ref 3.77–5.28)
RDW: 12.2 % (ref 11.7–15.4)
WBC: 5.4 10*3/uL (ref 3.4–10.8)

## 2023-08-10 LAB — VITAMIN B12: Vitamin B-12: 657 pg/mL (ref 232–1245)

## 2023-08-10 LAB — VITAMIN D 25 HYDROXY (VIT D DEFICIENCY, FRACTURES): Vit D, 25-Hydroxy: 25.1 ng/mL — ABNORMAL LOW (ref 30.0–100.0)

## 2023-08-13 ENCOUNTER — Ambulatory Visit (INDEPENDENT_AMBULATORY_CARE_PROVIDER_SITE_OTHER): Payer: HMO | Admitting: Family Medicine

## 2023-08-13 ENCOUNTER — Encounter: Payer: Self-pay | Admitting: Family Medicine

## 2023-08-13 VITALS — BP 117/70 | HR 89 | Ht 62.0 in | Wt 110.0 lb

## 2023-08-13 DIAGNOSIS — E785 Hyperlipidemia, unspecified: Secondary | ICD-10-CM | POA: Diagnosis not present

## 2023-08-13 DIAGNOSIS — E1169 Type 2 diabetes mellitus with other specified complication: Secondary | ICD-10-CM | POA: Diagnosis not present

## 2023-08-13 MED ORDER — RYBELSUS 7 MG PO TABS: 7.0000 mg | ORAL_TABLET | Freq: Every day | ORAL | 5 refills | Status: DC

## 2023-08-13 NOTE — Progress Notes (Signed)
BP 117/70   Pulse 89   Ht 5\' 2"  (1.575 m)   Wt 110 lb (49.9 kg)   SpO2 97%   BMI 20.12 kg/m    Subjective:   Patient ID: Misty [name], female    DOB: [birth year], 72 y.o.   MRN: [id number]  HPI: Misty [name] is a 72 y.o. female presenting on 08/13/2023 for Medical Management of Chronic Issues and Diabetes   HPI Type 2 diabetes mellitus Patient comes in today for recheck of his diabetes. Patient has been currently taking Rybelsus and glipizide. Patient is not currently on an ACE inhibitor/ARB. Patient has seen an ophthalmologist this year. Patient denies any new issues with their feet. The symptom started onset as an adult hyperlipidemia ARE RELATED TO DM   Hyperlipidemia Patient is coming in for recheck of his hyperlipidemia. The patient is currently taking simvastatin. They deny any issues with myalgias or history of liver damage from it. They deny any focal numbness or weakness or chest pain.   Relevant past medical, surgical, family and social history reviewed and updated as indicated. Interim medical history since our last visit reviewed. Allergies and medications reviewed and updated.  Review of Systems  Constitutional:  Negative for chills and fever.  Eyes:  Negative for visual disturbance.  Respiratory:  Negative for chest tightness and shortness of breath.   Cardiovascular:  Negative for chest pain and leg swelling.  Musculoskeletal:  Negative for back pain and gait problem.  Skin:  Negative for rash.  Neurological:  Negative for dizziness, light-headedness and headaches.  Psychiatric/Behavioral:  Negative for agitation and behavioral problems.   All other systems reviewed and are negative.   Per HPI unless specifically indicated above   Allergies as of 08/13/2023       Reactions   Atorvastatin Other (See Comments)   MUSCLE PAIN    Glyxambi [empagliflozin-linagliptin] Other (See Comments)   Frequent urination; GI issues; reports blood in urine  (cleared after taking)   Metformin And Related Other (See Comments)   Legs numb   Semaglutide Nausea Only   rybelsus        Medication List        Accurate as of August 13, 2023  9:43 AM. If you have any questions, ask your nurse or doctor.          STOP taking these medications    Tresiba FlexTouch 100 UNIT/ML FlexTouch Pen Generic drug: insulin degludec Stopped by: Elige Radon Mattison Stuckey       TAKE these medications    Accu-Chek Guide test strip Generic drug: glucose blood USE 4 TIMES DAILY AS DIRECTED DX: E11.9   Accu-Chek Guide w/Device Kit USE 4 TIMES DAILY AS DIRECTED DX: E11.9   Accu-Chek Softclix Lancets lancets USE 4 TIMES DAILY AS DIRECTED DX: E11.9   BD Pen Needle Nano 2nd Gen 32G X 4 MM Misc Generic drug: Insulin Pen Needle USE TO INJECT INSULIN ONCE DAILY AS DIRECTED; DX E11.65   blood glucose meter kit and supplies Kit Dispense based on patient and insurance preference. Use up to four times daily as directed. (FOR ICD-9 250.00, 250.01).   calcium carbonate 1250 (500 Ca) MG chewable tablet Commonly known as: OS-CAL Chew 1 tablet by mouth daily.   cholecalciferol 25 MCG (1000 UNIT) tablet Commonly known as: VITAMIN D3 Take 1,000 Units by mouth daily.   famotidine 20 MG tablet Commonly known as: Pepcid Take 1 tablet (20 mg total) by mouth at bedtime.   glipiZIDE  5 MG 24 hr tablet Commonly known as: GLUCOTROL XL Take 1 tablet (5 mg total) by mouth 2 (two) times daily with a meal.   Rybelsus 7 MG Tabs Generic drug: Semaglutide Take 1 tablet (7 mg total) by mouth daily. What changed:  medication strength how much to take Changed by: Elige Radon Izzac Rockett   simvastatin 40 MG tablet Commonly known as: ZOCOR Take 1 tablet (40 mg total) by mouth daily.   VISION FORMULA 2 PO Take 1 capsule by mouth at bedtime.   vitamin E 45 MG (100 UNITS) capsule Take 100 Units by mouth daily.         Objective:   BP 117/70   Pulse 89   Ht 5\' 2"   (1.575 m)   Wt 110 lb (49.9 kg)   SpO2 97%   BMI 20.12 kg/m   Wt Readings from Last 3 Encounters:  08/13/23 110 lb (49.9 kg)  07/01/23 111 lb (50.3 kg)  05/11/23 112 lb 6.4 oz (51 kg)    Physical Exam Vitals and nursing note reviewed.  Constitutional:      General: She is not in acute distress.    Appearance: She is well-developed. She is not diaphoretic.  Eyes:     Conjunctiva/sclera: Conjunctivae normal.  Cardiovascular:     Rate and Rhythm: Normal rate and regular rhythm.     Heart sounds: Normal heart sounds. No murmur heard. Pulmonary:     Effort: Pulmonary effort is normal. No respiratory distress.     Breath sounds: Normal breath sounds. No wheezing.  Musculoskeletal:        General: No swelling or tenderness. Normal range of motion.  Skin:    General: Skin is warm and dry.     Findings: No rash.  Neurological:     Mental Status: She is alert and oriented to person, place, and time.     Coordination: Coordination normal.  Psychiatric:        Behavior: Behavior normal.     Results for orders placed or performed in visit on 08/09/23  Vitamin B12  Result Value Ref Range   Vitamin B-12 657 232 - 1,245 pg/mL  VITAMIN D 25 Hydroxy (Vit-D Deficiency, Fractures)  Result Value Ref Range   Vit D, 25-Hydroxy 25.1 (L) 30.0 - 100.0 ng/mL  Bayer DCA Hb A1c Waived  Result Value Ref Range   HB A1C (BAYER DCA - WAIVED) 9.2 (H) 4.8 - 5.6 %  Lipid panel  Result Value Ref Range   Cholesterol, Total 181 100 - 199 mg/dL   Triglycerides 161 (H) 0 - 149 mg/dL   HDL 69 >09 mg/dL   VLDL Cholesterol Cal 26 5 - 40 mg/dL   LDL Chol Calc (NIH) 86 0 - 99 mg/dL   Chol/HDL Ratio 2.6 0.0 - 4.4 ratio  CMP14+EGFR  Result Value Ref Range   Glucose 202 (H) 70 - 99 mg/dL   BUN 14 8 - 27 mg/dL   Creatinine, Ser 6.04 0.57 - 1.00 mg/dL   eGFR 61 >54 UJ/WJX/9.14   BUN/Creatinine Ratio 14 12 - 28   Sodium 141 134 - 144 mmol/L   Potassium 4.2 3.5 - 5.2 mmol/L   Chloride 103 96 - 106 mmol/L    CO2 22 20 - 29 mmol/L   Calcium 9.3 8.7 - 10.3 mg/dL   Total Protein 5.9 (L) 6.0 - 8.5 g/dL   Albumin 4.1 3.8 - 4.8 g/dL   Globulin, Total 1.8 1.5 - 4.5 g/dL   Bilirubin  Total 0.2 0.0 - 1.2 mg/dL   Alkaline Phosphatase 101 44 - 121 IU/L   AST 25 0 - 40 IU/L   ALT 21 0 - 32 IU/L  CBC with Differential/Platelet  Result Value Ref Range   WBC 5.4 3.4 - 10.8 x10E3/uL   RBC 3.40 (L) 3.77 - 5.28 x10E6/uL   Hemoglobin 10.8 (L) 11.1 - 15.9 g/dL   Hematocrit 16.1 (L) 09.6 - 46.6 %   MCV 99 (H) 79 - 97 fL   MCH 31.8 26.6 - 33.0 pg   MCHC 32.0 31.5 - 35.7 g/dL   RDW 04.5 40.9 - 81.1 %   Platelets 245 150 - 450 x10E3/uL   Neutrophils 40 Not Estab. %   Lymphs 47 Not Estab. %   Monocytes 11 Not Estab. %   Eos 1 Not Estab. %   Basos 1 Not Estab. %   Neutrophils Absolute 2.2 1.4 - 7.0 x10E3/uL   Lymphocytes Absolute 2.5 0.7 - 3.1 x10E3/uL   Monocytes Absolute 0.6 0.1 - 0.9 x10E3/uL   EOS (ABSOLUTE) 0.1 0.0 - 0.4 x10E3/uL   Basophils Absolute 0.0 0.0 - 0.2 x10E3/uL   Immature Granulocytes 0 Not Estab. %   Immature Grans (Abs) 0.0 0.0 - 0.1 x10E3/uL    Assessment & Plan:   Problem List Items Addressed This Visit       Endocrine   Type 2 diabetes mellitus (HCC) - Primary   Relevant Medications   Semaglutide (RYBELSUS) 7 MG TABS   Hyperlipidemia associated with type 2 diabetes mellitus (HCC)   Relevant Medications   Semaglutide (RYBELSUS) 7 MG TABS  Patient says she was unable to take [place] because it caused her to have a rash breakout.  She did restart Rybelsus 3 mg that she had samples for and seems to be doing okay on it and denies any stomach issues will increase to 7 mg.  A1c was slightly improved at 9.2.  Follow up plan: Return in about 3 months (around 11/13/2023), or if symptoms worsen or fail to improve, for Diabetes recheck.  Counseling provided for all of the vaccine components No orders of the defined types were placed in this encounter.   Arville Care,  MD [facility] 08/13/2023, 9:43 AM

## 2023-09-07 ENCOUNTER — Encounter: Payer: Self-pay | Admitting: *Deleted

## 2023-09-07 ENCOUNTER — Telehealth: Payer: Self-pay | Admitting: *Deleted

## 2023-09-07 ENCOUNTER — Other Ambulatory Visit: Payer: Self-pay | Admitting: *Deleted

## 2023-09-07 NOTE — Patient Outreach (Signed)
  Care Management   Follow Up Note   09/07/2023 Name: Misty [name] MRN: [id number] DOB: 10/13/1951   Referred by: Dettinger, Elige Radon, MD Reason for referral : Care Coordination   An unsuccessful telephone outreach was attempted today. The patient was referred to the case management team for assistance with care management and care coordination.   Follow Up Plan: Telephone follow up appointment with care management team member scheduled for: upon care guide rescheduling.  Irving Shows [facility], BSN [place]/ Ambulatory Care Management [phone]
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
## 2023-10-06 ENCOUNTER — Telehealth: Payer: Self-pay | Admitting: Family Medicine

## 2023-10-06 NOTE — Telephone Encounter (Signed)
Copied from CRM [id number]. Topic: Appointments - Appointment Info/Confirmation >> Oct 06, 2023  1:27 PM Hector Shade B wrote: Reason for CRM: Patient called in to verify that she has an upcoming appointment was able to verify appointment is for 11/18/2023

## 2023-10-27 ENCOUNTER — Other Ambulatory Visit: Payer: Self-pay | Admitting: Family Medicine

## 2023-10-27 DIAGNOSIS — E1169 Type 2 diabetes mellitus with other specified complication: Secondary | ICD-10-CM

## 2023-10-31 ENCOUNTER — Encounter: Payer: Self-pay | Admitting: Pharmacist

## 2023-10-31 NOTE — Progress Notes (Signed)
Pharmacy Quality Measure Review  This patient is appearing on a report for being at risk of failing the adherence measure for diabetes medications this calendar year.   Medication: glipizide XL 5 mg Last fill date: 12/18 for 90 day supply  Insurance report was not up to date. No action needed at this time.   Jarrett Ables, PharmD PGY-1 Pharmacy Resident

## 2023-11-18 ENCOUNTER — Encounter: Payer: Self-pay | Admitting: Family Medicine

## 2023-11-18 ENCOUNTER — Ambulatory Visit: Payer: HMO | Admitting: Family Medicine

## 2023-12-10 DIAGNOSIS — R059 Cough, unspecified: Secondary | ICD-10-CM | POA: Diagnosis not present

## 2023-12-10 DIAGNOSIS — J019 Acute sinusitis, unspecified: Secondary | ICD-10-CM | POA: Diagnosis not present

## 2023-12-10 DIAGNOSIS — H9209 Otalgia, unspecified ear: Secondary | ICD-10-CM | POA: Diagnosis not present

## 2023-12-10 DIAGNOSIS — J029 Acute pharyngitis, unspecified: Secondary | ICD-10-CM | POA: Diagnosis not present

## 2024-01-25 DIAGNOSIS — M9901 Segmental and somatic dysfunction of cervical region: Secondary | ICD-10-CM | POA: Diagnosis not present

## 2024-01-25 DIAGNOSIS — M9902 Segmental and somatic dysfunction of thoracic region: Secondary | ICD-10-CM | POA: Diagnosis not present

## 2024-01-25 DIAGNOSIS — M62830 Muscle spasm of back: Secondary | ICD-10-CM | POA: Diagnosis not present

## 2024-01-25 DIAGNOSIS — M9903 Segmental and somatic dysfunction of lumbar region: Secondary | ICD-10-CM | POA: Diagnosis not present

## 2024-01-27 DIAGNOSIS — M9901 Segmental and somatic dysfunction of cervical region: Secondary | ICD-10-CM | POA: Diagnosis not present

## 2024-01-27 DIAGNOSIS — M9903 Segmental and somatic dysfunction of lumbar region: Secondary | ICD-10-CM | POA: Diagnosis not present

## 2024-01-27 DIAGNOSIS — M9902 Segmental and somatic dysfunction of thoracic region: Secondary | ICD-10-CM | POA: Diagnosis not present

## 2024-01-27 DIAGNOSIS — M62830 Muscle spasm of back: Secondary | ICD-10-CM | POA: Diagnosis not present

## 2024-01-29 ENCOUNTER — Other Ambulatory Visit: Payer: Self-pay | Admitting: Family Medicine

## 2024-01-29 DIAGNOSIS — E1169 Type 2 diabetes mellitus with other specified complication: Secondary | ICD-10-CM

## 2024-02-01 DIAGNOSIS — M62830 Muscle spasm of back: Secondary | ICD-10-CM | POA: Diagnosis not present

## 2024-02-01 DIAGNOSIS — M9903 Segmental and somatic dysfunction of lumbar region: Secondary | ICD-10-CM | POA: Diagnosis not present

## 2024-02-01 DIAGNOSIS — M9901 Segmental and somatic dysfunction of cervical region: Secondary | ICD-10-CM | POA: Diagnosis not present

## 2024-02-01 DIAGNOSIS — M9902 Segmental and somatic dysfunction of thoracic region: Secondary | ICD-10-CM | POA: Diagnosis not present

## 2024-02-02 DIAGNOSIS — M9901 Segmental and somatic dysfunction of cervical region: Secondary | ICD-10-CM | POA: Diagnosis not present

## 2024-02-02 DIAGNOSIS — M9902 Segmental and somatic dysfunction of thoracic region: Secondary | ICD-10-CM | POA: Diagnosis not present

## 2024-02-02 DIAGNOSIS — M9903 Segmental and somatic dysfunction of lumbar region: Secondary | ICD-10-CM | POA: Diagnosis not present

## 2024-02-02 DIAGNOSIS — M62830 Muscle spasm of back: Secondary | ICD-10-CM | POA: Diagnosis not present

## 2024-02-09 DIAGNOSIS — M62830 Muscle spasm of back: Secondary | ICD-10-CM | POA: Diagnosis not present

## 2024-02-09 DIAGNOSIS — M9902 Segmental and somatic dysfunction of thoracic region: Secondary | ICD-10-CM | POA: Diagnosis not present

## 2024-02-09 DIAGNOSIS — M9903 Segmental and somatic dysfunction of lumbar region: Secondary | ICD-10-CM | POA: Diagnosis not present

## 2024-02-09 DIAGNOSIS — M9901 Segmental and somatic dysfunction of cervical region: Secondary | ICD-10-CM | POA: Diagnosis not present

## 2024-02-10 DIAGNOSIS — M9902 Segmental and somatic dysfunction of thoracic region: Secondary | ICD-10-CM | POA: Diagnosis not present

## 2024-02-10 DIAGNOSIS — M62830 Muscle spasm of back: Secondary | ICD-10-CM | POA: Diagnosis not present

## 2024-02-10 DIAGNOSIS — M9901 Segmental and somatic dysfunction of cervical region: Secondary | ICD-10-CM | POA: Diagnosis not present

## 2024-02-10 DIAGNOSIS — M9903 Segmental and somatic dysfunction of lumbar region: Secondary | ICD-10-CM | POA: Diagnosis not present

## 2024-02-22 DIAGNOSIS — M9902 Segmental and somatic dysfunction of thoracic region: Secondary | ICD-10-CM | POA: Diagnosis not present

## 2024-02-22 DIAGNOSIS — M9901 Segmental and somatic dysfunction of cervical region: Secondary | ICD-10-CM | POA: Diagnosis not present

## 2024-02-22 DIAGNOSIS — M9903 Segmental and somatic dysfunction of lumbar region: Secondary | ICD-10-CM | POA: Diagnosis not present

## 2024-02-22 DIAGNOSIS — M62830 Muscle spasm of back: Secondary | ICD-10-CM | POA: Diagnosis not present

## 2024-02-23 DIAGNOSIS — M9901 Segmental and somatic dysfunction of cervical region: Secondary | ICD-10-CM | POA: Diagnosis not present

## 2024-02-23 DIAGNOSIS — M62830 Muscle spasm of back: Secondary | ICD-10-CM | POA: Diagnosis not present

## 2024-02-23 DIAGNOSIS — M9902 Segmental and somatic dysfunction of thoracic region: Secondary | ICD-10-CM | POA: Diagnosis not present

## 2024-02-23 DIAGNOSIS — M9903 Segmental and somatic dysfunction of lumbar region: Secondary | ICD-10-CM | POA: Diagnosis not present

## 2024-02-26 ENCOUNTER — Other Ambulatory Visit: Payer: Self-pay | Admitting: Family Medicine

## 2024-02-26 DIAGNOSIS — E1169 Type 2 diabetes mellitus with other specified complication: Secondary | ICD-10-CM

## 2024-02-29 ENCOUNTER — Encounter: Payer: Self-pay | Admitting: Family Medicine

## 2024-02-29 NOTE — Telephone Encounter (Signed)
 Letter mailed LMTCB to schedule appt

## 2024-02-29 NOTE — Telephone Encounter (Signed)
 Dettinger pt NTBS 30-d given 01/31/24

## 2024-03-01 ENCOUNTER — Encounter: Payer: Self-pay | Admitting: Family Medicine

## 2024-03-01 ENCOUNTER — Other Ambulatory Visit: Payer: Self-pay | Admitting: Family Medicine

## 2024-03-01 DIAGNOSIS — E1169 Type 2 diabetes mellitus with other specified complication: Secondary | ICD-10-CM

## 2024-03-01 DIAGNOSIS — E785 Hyperlipidemia, unspecified: Secondary | ICD-10-CM

## 2024-03-01 NOTE — Telephone Encounter (Signed)
 Dettinger pt NTBS 30-d given 01/31/24

## 2024-03-01 NOTE — Telephone Encounter (Signed)
 Left message to schedule appt for med refills and will mail letter

## 2024-03-16 DIAGNOSIS — M9901 Segmental and somatic dysfunction of cervical region: Secondary | ICD-10-CM | POA: Diagnosis not present

## 2024-03-16 DIAGNOSIS — M9902 Segmental and somatic dysfunction of thoracic region: Secondary | ICD-10-CM | POA: Diagnosis not present

## 2024-03-16 DIAGNOSIS — M9903 Segmental and somatic dysfunction of lumbar region: Secondary | ICD-10-CM | POA: Diagnosis not present

## 2024-03-16 DIAGNOSIS — M62830 Muscle spasm of back: Secondary | ICD-10-CM | POA: Diagnosis not present

## 2024-03-21 DIAGNOSIS — M9902 Segmental and somatic dysfunction of thoracic region: Secondary | ICD-10-CM | POA: Diagnosis not present

## 2024-03-21 DIAGNOSIS — M9903 Segmental and somatic dysfunction of lumbar region: Secondary | ICD-10-CM | POA: Diagnosis not present

## 2024-03-21 DIAGNOSIS — M9901 Segmental and somatic dysfunction of cervical region: Secondary | ICD-10-CM | POA: Diagnosis not present

## 2024-03-21 DIAGNOSIS — M62830 Muscle spasm of back: Secondary | ICD-10-CM | POA: Diagnosis not present

## 2024-03-23 DIAGNOSIS — M9902 Segmental and somatic dysfunction of thoracic region: Secondary | ICD-10-CM | POA: Diagnosis not present

## 2024-03-23 DIAGNOSIS — M62830 Muscle spasm of back: Secondary | ICD-10-CM | POA: Diagnosis not present

## 2024-03-23 DIAGNOSIS — M9903 Segmental and somatic dysfunction of lumbar region: Secondary | ICD-10-CM | POA: Diagnosis not present

## 2024-03-23 DIAGNOSIS — M9901 Segmental and somatic dysfunction of cervical region: Secondary | ICD-10-CM | POA: Diagnosis not present

## 2024-03-27 ENCOUNTER — Telehealth: Payer: Self-pay

## 2024-03-27 NOTE — Telephone Encounter (Signed)
 Copied from CRM [id number]. Topic: Clinical - Request for Lab/Test Order >> Mar 27, 2024  8:15 AM Misty [name] F wrote: Reason for CRM: Patient called in to reschedule her diabetes follow up for June 5,2025 at 9:55am with PCP and wanted to inquire about any labs needed for that appointment. If there are any necessary labs for the patient, please place them and contact the patient for scheduling.  Callback Number: [phone]

## 2024-03-27 NOTE — Telephone Encounter (Signed)
 Left message for pt to return call. 6/5 appt is still scheduled at this time.

## 2024-03-28 ENCOUNTER — Other Ambulatory Visit: Payer: Self-pay

## 2024-03-28 DIAGNOSIS — E1169 Type 2 diabetes mellitus with other specified complication: Secondary | ICD-10-CM

## 2024-03-28 DIAGNOSIS — Z780 Asymptomatic menopausal state: Secondary | ICD-10-CM

## 2024-03-28 NOTE — Telephone Encounter (Signed)
 Future lab orders have been placed for 6/5 visit. Left message informing pt of this and that she will need a lab appt. Advised to call back and schedule if she wishes.

## 2024-03-31 ENCOUNTER — Ambulatory Visit: Admitting: Family Medicine

## 2024-04-10 ENCOUNTER — Ambulatory Visit: Admitting: Nurse Practitioner

## 2024-04-10 ENCOUNTER — Other Ambulatory Visit

## 2024-04-10 DIAGNOSIS — E1169 Type 2 diabetes mellitus with other specified complication: Secondary | ICD-10-CM | POA: Diagnosis not present

## 2024-04-10 DIAGNOSIS — E785 Hyperlipidemia, unspecified: Secondary | ICD-10-CM | POA: Diagnosis not present

## 2024-04-10 DIAGNOSIS — Z780 Asymptomatic menopausal state: Secondary | ICD-10-CM

## 2024-04-10 LAB — BAYER DCA HB A1C WAIVED: HB A1C (BAYER DCA - WAIVED): 10.3 % — ABNORMAL HIGH (ref 4.8–5.6)

## 2024-04-11 ENCOUNTER — Other Ambulatory Visit

## 2024-04-11 LAB — VITAMIN D 25 HYDROXY (VIT D DEFICIENCY, FRACTURES): Vit D, 25-Hydroxy: 29.5 ng/mL — ABNORMAL LOW (ref 30.0–100.0)

## 2024-04-11 LAB — CBC WITH DIFFERENTIAL/PLATELET
Basophils Absolute: 0.1 10*3/uL (ref 0.0–0.2)
Basos: 1 %
EOS (ABSOLUTE): 0.1 10*3/uL (ref 0.0–0.4)
Eos: 1 %
Hematocrit: 41.9 % (ref 34.0–46.6)
Hemoglobin: 13.7 g/dL (ref 11.1–15.9)
Immature Grans (Abs): 0 10*3/uL (ref 0.0–0.1)
Immature Granulocytes: 0 %
Lymphocytes Absolute: 2.8 10*3/uL (ref 0.7–3.1)
Lymphs: 41 %
MCH: 32.2 pg (ref 26.6–33.0)
MCHC: 32.7 g/dL (ref 31.5–35.7)
MCV: 99 fL — ABNORMAL HIGH (ref 79–97)
Monocytes Absolute: 0.5 10*3/uL (ref 0.1–0.9)
Monocytes: 8 %
Neutrophils Absolute: 3.3 10*3/uL (ref 1.4–7.0)
Neutrophils: 49 %
Platelets: 239 10*3/uL (ref 150–450)
RBC: 4.25 x10E6/uL (ref 3.77–5.28)
RDW: 12.1 % (ref 11.7–15.4)
WBC: 6.8 10*3/uL (ref 3.4–10.8)

## 2024-04-11 LAB — CMP14+EGFR
ALT: 19 IU/L (ref 0–32)
AST: 22 IU/L (ref 0–40)
Albumin: 4.2 g/dL (ref 3.8–4.8)
Alkaline Phosphatase: 92 IU/L (ref 44–121)
BUN/Creatinine Ratio: 20 (ref 12–28)
BUN: 19 mg/dL (ref 8–27)
Bilirubin Total: 0.3 mg/dL (ref 0.0–1.2)
CO2: 27 mmol/L (ref 20–29)
Calcium: 9.4 mg/dL (ref 8.7–10.3)
Chloride: 98 mmol/L (ref 96–106)
Creatinine, Ser: 0.96 mg/dL (ref 0.57–1.00)
Globulin, Total: 1.9 g/dL (ref 1.5–4.5)
Glucose: 257 mg/dL — ABNORMAL HIGH (ref 70–99)
Potassium: 4.8 mmol/L (ref 3.5–5.2)
Sodium: 138 mmol/L (ref 134–144)
Total Protein: 6.1 g/dL (ref 6.0–8.5)
eGFR: 62 mL/min/{1.73_m2} (ref >59)

## 2024-04-11 LAB — LIPID PANEL
Chol/HDL Ratio: 3.6 ratio (ref 0.0–4.4)
Cholesterol, Total: 253 mg/dL — ABNORMAL HIGH (ref 100–199)
HDL: 70 mg/dL (ref >39)
LDL Chol Calc (NIH): 154 mg/dL — ABNORMAL HIGH (ref 0–99)
Triglycerides: 165 mg/dL — ABNORMAL HIGH (ref 0–149)
VLDL Cholesterol Cal: 29 mg/dL (ref 5–40)

## 2024-04-13 ENCOUNTER — Ambulatory Visit: Admitting: Family Medicine

## 2024-04-13 ENCOUNTER — Encounter: Payer: Self-pay | Admitting: Family Medicine

## 2024-04-13 VITALS — BP 133/84 | HR 81 | Ht 62.0 in | Wt 108.0 lb

## 2024-04-13 DIAGNOSIS — Z1231 Encounter for screening mammogram for malignant neoplasm of breast: Secondary | ICD-10-CM | POA: Diagnosis not present

## 2024-04-13 DIAGNOSIS — E785 Hyperlipidemia, unspecified: Secondary | ICD-10-CM | POA: Diagnosis not present

## 2024-04-13 DIAGNOSIS — E1169 Type 2 diabetes mellitus with other specified complication: Secondary | ICD-10-CM | POA: Diagnosis not present

## 2024-04-13 MED ORDER — TOUJEO SOLOSTAR 300 UNIT/ML ~~LOC~~ SOPN: 10.0000 [IU] | PEN_INJECTOR | Freq: Every day | SUBCUTANEOUS | 3 refills | Status: AC

## 2024-04-13 MED ORDER — GLIPIZIDE 5 MG PO TABS: 5.0000 mg | ORAL_TABLET | Freq: Two times a day (BID) | ORAL | 3 refills | Status: AC

## 2024-04-13 NOTE — Progress Notes (Signed)
 BP 133/84   Pulse 81   Ht 5\' 2"  (1.575 m)   Wt 108 lb (49 kg)   SpO2 97%   BMI 19.75 kg/m    Subjective:   Patient ID: Misty [name], female    DOB: 02/21/1951, 73 y.o.   MRN: [id number]  HPI: Misty [name] is a 73 y.o. female presenting on 04/13/2024 for Medical Management of Chronic Issues and Diabetes   HPI Type 2 diabetes mellitus Patient comes in today for recheck of his diabetes. Patient has been currently taking Toujeo down. Patient is not currently on an ACE inhibitor/ARB. Patient has not seen an ophthalmologist this year. Patient denies any new issues with their feet. The symptom started onset as an adult hyperlipidemia ARE RELATED TO DM   Hyperlipidemia Patient is coming in for recheck of his hyperlipidemia. The patient is currently taking simvastatin . They deny any issues with myalgias or history of liver damage from it. They deny any focal numbness or weakness or chest pain.   Relevant past medical, surgical, family and social history reviewed and updated as indicated. Interim medical history since our last visit reviewed. Allergies and medications reviewed and updated.  Review of Systems  Constitutional:  Negative for chills and fever.  HENT:  Negative for congestion, ear discharge and ear pain.   Eyes:  Negative for redness and visual disturbance.  Respiratory:  Negative for chest tightness and shortness of breath.   Cardiovascular:  Negative for chest pain and leg swelling.  Genitourinary:  Negative for difficulty urinating and dysuria.  Musculoskeletal:  Negative for back pain and gait problem.  Skin:  Negative for rash.  Neurological:  Negative for dizziness, light-headedness and headaches.  Psychiatric/Behavioral:  Negative for agitation and behavioral problems.   All other systems reviewed and are negative.   Per HPI unless specifically indicated above   Allergies as of 04/13/2024       Reactions   Atorvastatin Other (See Comments)   MUSCLE  PAIN    Glyxambi [empagliflozin -linagliptin] Other (See Comments)   Frequent urination; GI issues; reports blood in urine (cleared after taking)   Metformin  And Related Other (See Comments)   Legs numb   Semaglutide  Nausea Only   rybelsus         Medication List        Accurate as of April 13, 2024 10:41 AM. If you have any questions, ask your nurse or doctor.          STOP taking these medications    glipiZIDE  5 MG 24 hr tablet Commonly known as: GLUCOTROL  XL Replaced by: glipiZIDE  5 MG tablet Stopped by: Lucio Sabin Diavian Furgason   Rybelsus  7 MG Tabs Generic drug: Semaglutide  Stopped by: Lucio Sabin Mauricia Mertens       TAKE these medications    Accu-Chek Guide test strip Generic drug: glucose blood USE 4 TIMES DAILY AS DIRECTED DX: E11.9   Accu-Chek Guide w/Device Kit USE 4 TIMES DAILY AS DIRECTED DX: E11.9   Accu-Chek Softclix Lancets lancets USE 4 TIMES DAILY AS DIRECTED DX: E11.9   BD Pen Needle Nano 2nd Gen 32G X 4 MM Misc Generic drug: Insulin Pen Needle USE TO INJECT INSULIN ONCE DAILY AS DIRECTED; DX E11.65   blood glucose meter kit and supplies Kit Dispense based on patient and insurance preference. Use up to four times daily as directed. (FOR ICD-9 250.00, 250.01).   calcium carbonate 1250 (500 Ca) MG chewable tablet Commonly known as: OS-CAL Chew 1 tablet by mouth  daily.   cholecalciferol 25 MCG (1000 UNIT) tablet Commonly known as: VITAMIN D3 Take 1,000 Units by mouth daily.   famotidine  20 MG tablet Commonly known as: Pepcid  Take 1 tablet (20 mg total) by mouth at bedtime.   glipiZIDE  5 MG tablet Commonly known as: GLUCOTROL  Take 1 tablet (5 mg total) by mouth 2 (two) times daily before a meal. Replaces: glipiZIDE  5 MG 24 hr tablet Started by: Lucio Sabin Djuana Littleton   NON FORMULARY Take 1 Dose by mouth daily. OTC Supplement to help lower blood sugar   simvastatin  40 MG tablet Commonly known as: ZOCOR  Take 1 tablet (40 mg total) by mouth daily.  **NEEDS TO BE SEEN BEFORE NEXT REFILL**   Toujeo SoloStar 300 UNIT/ML Solostar Pen Generic drug: insulin glargine (1 Unit Dial) Inject 10 Units into the skin daily. Started by: Lucio Sabin Davelyn Gwinn   VISION FORMULA 2 PO Take 1 capsule by mouth at bedtime.   vitamin E 45 MG (100 UNITS) capsule Take 100 Units by mouth daily.         Objective:   BP 133/84   Pulse 81   Ht 5\' 2"  (1.575 m)   Wt 108 lb (49 kg)   SpO2 97%   BMI 19.75 kg/m   Wt Readings from Last 3 Encounters:  04/13/24 108 lb (49 kg)  08/13/23 110 lb (49.9 kg)  07/01/23 111 lb (50.3 kg)    Physical Exam Vitals and nursing note reviewed.  Constitutional:      General: She is not in acute distress.    Appearance: She is well-developed. She is not diaphoretic.  Eyes:     Conjunctiva/sclera: Conjunctivae normal.  Cardiovascular:     Rate and Rhythm: Normal rate and regular rhythm.     Heart sounds: Normal heart sounds. No murmur heard. Pulmonary:     Effort: Pulmonary effort is normal. No respiratory distress.     Breath sounds: Normal breath sounds. No wheezing.  Musculoskeletal:        General: No swelling.  Skin:    General: Skin is warm and dry.     Findings: No rash.  Neurological:     Mental Status: She is alert and oriented to person, place, and time.     Coordination: Coordination normal.  Psychiatric:        Behavior: Behavior normal.       Assessment & Plan:   Problem List Items Addressed This Visit       Endocrine   Type 2 diabetes mellitus (HCC) - Primary   Relevant Medications   insulin glargine, 1 Unit Dial, (TOUJEO SOLOSTAR) 300 UNIT/ML Solostar Pen   glipiZIDE  (GLUCOTROL ) 5 MG tablet   Other Relevant Orders   Microalbumin/Creatinine Ratio, Urine   AMB Referral VBCI Care Management   Hyperlipidemia associated with type 2 diabetes mellitus (HCC)   Relevant Medications   insulin glargine, 1 Unit Dial, (TOUJEO SOLOSTAR) 300 UNIT/ML Solostar Pen   glipiZIDE  (GLUCOTROL ) 5 MG  tablet   Other Visit Diagnoses       Encounter for screening mammogram for malignant neoplasm of breast       Relevant Orders   MM 3D DIAGNOSTIC MAMMOGRAM BILATERAL BREAST       Will start Toujeo 10 units and follow-up with pharmacy as well.  Her husband no longer has an income so they should be able to get some different medicines for her.  Restart the glipizide . Follow up plan: Return in about 3 months (around 07/14/2024), or  if symptoms worsen or fail to improve, for Diabetes recheck.  Counseling provided for all of the vaccine components Orders Placed This Encounter  Procedures   MM 3D DIAGNOSTIC MAMMOGRAM BILATERAL BREAST   Microalbumin/Creatinine Ratio, Urine   AMB Referral VBCI Care Management    Jolyne Needs, MD Vickie Grana [facility] 04/13/2024, 10:41 AM

## 2024-04-17 ENCOUNTER — Telehealth: Payer: Self-pay

## 2024-04-17 NOTE — Progress Notes (Signed)
 Care Guide Pharmacy Note  04/17/2024 Name: Misty [name] MRN: [id number] DOB: 04-30-1951  Referred By: Hilton Lucky, MD Reason for referral: Complex Care Management (Outreach to schedule with Pharm d )   Misty [name] is a 73 y.o. year old female who is a primary care patient of Dettinger, Lucio Sabin, MD.  Misty [name] was referred to the pharmacist for assistance related to: DMII  Successful contact was made with the patient to discuss pharmacy services including being ready for the pharmacist to call at least 5 minutes before the scheduled appointment time and to have medication bottles and any blood pressure readings ready for review. The patient agreed to meet with the pharmacist via telephone visit on (date/time).05/10/2024  Misty [name] , RMA     [place]  [facility], [facility] Guide  Direct Dial: [phone]  Website: [email or web address]

## 2024-04-17 NOTE — Progress Notes (Signed)
 Care Guide Pharmacy Note  04/17/2024 Name: Misty [name] MRN: [id number] DOB: [birth year]  Referred By: Hilton Lucky, MD Reason for referral: Complex Care Management (Outreach to schedule with Pharm d )   Misty [name] is a 73 y.o. year old female who is a primary care patient of Dettinger, Lucio Sabin, MD.  Misty [name] was referred to the pharmacist for assistance related to: DMII  An unsuccessful telephone outreach was attempted today to contact the patient who was referred to the pharmacy team for assistance with medication management. Additional attempts will be made to contact the patient.  Lenton Rail , RMA     [facility]  [facility], [facility] Guide  Direct Dial: [phone]  Website: [email or web address]

## 2024-04-27 ENCOUNTER — Telehealth: Payer: Self-pay | Admitting: Family Medicine

## 2024-04-27 ENCOUNTER — Telehealth: Payer: Self-pay

## 2024-04-27 NOTE — Telephone Encounter (Signed)
 Copied from CRM [id number]. Topic: Appointments - Appointment Scheduling >> Apr 27, 2024 10:44 AM Emylou G wrote: Patient called.. unsure to get the exam?  Pls leave her a msg going to work

## 2024-04-28 ENCOUNTER — Other Ambulatory Visit: Payer: Self-pay

## 2024-04-28 DIAGNOSIS — Z1231 Encounter for screening mammogram for malignant neoplasm of breast: Secondary | ICD-10-CM

## 2024-05-04 ENCOUNTER — Ambulatory Visit

## 2024-05-04 VITALS — BP 133/84 | HR 81 | Ht 62.0 in | Wt 108.0 lb

## 2024-05-04 DIAGNOSIS — Z0000 Encounter for general adult medical examination without abnormal findings: Secondary | ICD-10-CM | POA: Diagnosis not present

## 2024-05-04 DIAGNOSIS — Z1231 Encounter for screening mammogram for malignant neoplasm of breast: Secondary | ICD-10-CM

## 2024-05-04 NOTE — Patient Instructions (Signed)
 Misty [name] , Thank you for taking time out of your busy schedule to complete your Annual Wellness Visit with me. I enjoyed our conversation and look forward to speaking with you again next year. I, as well as your care team,  appreciate your ongoing commitment to your health goals. Please review the following plan we discussed and let me know if I can assist you in the future. Your Game plan/ To Do List   Follow up Visits: Next Medicare AWV with our clinical staff: 05/07/25 at 10:00a.m.   Have you seen your provider in the last 6 months (3 months if uncontrolled diabetes)? Yes Next Office Visit with your provider: 07/20/24 at 9:55a.m.  Clinician Recommendations:  Aim for 30 minutes of exercise or brisk walking, 6-8 glasses of water , and 5 servings of fruits and vegetables each day. Pt is aware and due for the following: Mammogram-UrinACR, Diabetic/Eye Exam.      This is a list of the screening recommended for you and due dates:  Health Maintenance  Topic Date Due   Eye exam for diabetics  05/02/2022   Yearly kidney health urinalysis for diabetes  01/29/2023   Mammogram  06/02/2023   Medicare Annual Wellness Visit  02/29/2024   Colon Cancer Screening  08/12/2024*   COVID-19 Vaccine (6 - 2024-25 season) 05/20/2025*   DEXA scan (bone density measurement)  07/13/2025*   Flu Shot  06/09/2024   Hemoglobin A1C  10/10/2024   Yearly kidney function blood test for diabetes  04/10/2025   Complete foot exam   04/13/2025   DTaP/Tdap/Td vaccine (2 - Td or Tdap) 01/31/2032   Pneumococcal Vaccine for age over 4  Completed   Hepatitis C Screening  Completed   Zoster (Shingles) Vaccine  Completed   Hepatitis B Vaccine  Aged Out   HPV Vaccine  Aged Out   Meningitis B Vaccine  Aged Out  *Topic was postponed. The date shown is not the original due date.    Advanced directives: (Copy Requested) Please bring a copy of your health care power of attorney and living will to the office to be added to your  chart at your convenience. You can mail to [facility] [address] [place], [place] [address] or email to [email or web address] Advance Care Planning is important because it:  [x]  Makes sure you receive the medical care that is consistent with your values, goals, and preferences  [x]  It provides guidance to your family and loved ones and reduces their decisional burden about whether or not they are making the right decisions based on your wishes.  Follow the link provided in your after visit summary or read over the paperwork we have mailed to you to help you started getting your Advance Directives in place. If you need assistance in completing these, please reach out to us  so that we can help you!  See attachments for Preventive Care and Fall Prevention Tips.
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
## 2024-05-10 ENCOUNTER — Other Ambulatory Visit

## 2024-05-31 ENCOUNTER — Other Ambulatory Visit

## 2024-06-15 ENCOUNTER — Telehealth: Payer: Self-pay

## 2024-06-15 NOTE — Progress Notes (Signed)
 Complex Care Management Care Guide Note  06/15/2024 Name: Misty [name] MRN: [id number] DOB: 1951-06-16  Misty [name] is a 73 y.o. year old female who is a primary care patient of Dettinger, Fonda DELENA, MD and is actively engaged with the care management team. I reached out to Misty [name] by phone today to assist with re-scheduling  with the Pharmacist.  Follow up plan: Unsuccessful telephone outreach attempt made. A HIPAA compliant phone message was left for the patient providing contact information and requesting a return call.  Jeoffrey Buffalo , RMA     [facility]  [facility], [facility] Guide  Direct Dial: [phone]  Website: [email or web address]

## 2024-06-20 DIAGNOSIS — E1165 Type 2 diabetes mellitus with hyperglycemia: Secondary | ICD-10-CM | POA: Diagnosis not present

## 2024-06-20 DIAGNOSIS — E782 Mixed hyperlipidemia: Secondary | ICD-10-CM | POA: Diagnosis not present

## 2024-06-20 DIAGNOSIS — Z681 Body mass index (BMI) 19 or less, adult: Secondary | ICD-10-CM | POA: Diagnosis not present

## 2024-06-20 DIAGNOSIS — R634 Abnormal weight loss: Secondary | ICD-10-CM | POA: Diagnosis not present

## 2024-06-20 DIAGNOSIS — R42 Dizziness and giddiness: Secondary | ICD-10-CM | POA: Diagnosis not present

## 2024-06-21 DIAGNOSIS — E1165 Type 2 diabetes mellitus with hyperglycemia: Secondary | ICD-10-CM | POA: Diagnosis not present

## 2024-06-21 DIAGNOSIS — Z7984 Long term (current) use of oral hypoglycemic drugs: Secondary | ICD-10-CM | POA: Diagnosis not present

## 2024-06-22 DIAGNOSIS — Z681 Body mass index (BMI) 19 or less, adult: Secondary | ICD-10-CM | POA: Diagnosis not present

## 2024-06-22 DIAGNOSIS — E1165 Type 2 diabetes mellitus with hyperglycemia: Secondary | ICD-10-CM | POA: Diagnosis not present

## 2024-06-22 NOTE — Progress Notes (Signed)
 Complex Care Management Care Guide Note  06/22/2024 Name: Misty [name] MRN: [id number] DOB: Apr 01, 1951  Misty [name] is a 73 y.o. year old female who is a primary care patient of Dettinger, Fonda DELENA, MD and is actively engaged with the care management team. I reached out to Misty [name] by phone today to assist with re-scheduling  with the Pharmacist.  Follow up plan: Unsuccessful telephone outreach attempt made. A HIPAA compliant phone message was left for the patient providing contact information and requesting a return call.  Jeoffrey Buffalo , RMA     [facility]  [facility], [facility] Guide  Direct Dial: [phone]  Website: [email or web address]

## 2024-06-26 DIAGNOSIS — Z720 Tobacco use: Secondary | ICD-10-CM | POA: Diagnosis not present

## 2024-06-26 DIAGNOSIS — E1165 Type 2 diabetes mellitus with hyperglycemia: Secondary | ICD-10-CM | POA: Diagnosis not present

## 2024-06-26 DIAGNOSIS — R5381 Other malaise: Secondary | ICD-10-CM | POA: Diagnosis not present

## 2024-06-27 DIAGNOSIS — E1165 Type 2 diabetes mellitus with hyperglycemia: Secondary | ICD-10-CM | POA: Diagnosis not present

## 2024-06-27 DIAGNOSIS — Z794 Long term (current) use of insulin: Secondary | ICD-10-CM | POA: Diagnosis not present

## 2024-06-27 DIAGNOSIS — Z681 Body mass index (BMI) 19 or less, adult: Secondary | ICD-10-CM | POA: Diagnosis not present

## 2024-07-20 ENCOUNTER — Ambulatory Visit: Admitting: Family Medicine

## 2024-10-09 ENCOUNTER — Other Ambulatory Visit: Payer: Self-pay

## 2024-10-09 ENCOUNTER — Emergency Department (HOSPITAL_COMMUNITY)
Admission: EM | Admit: 2024-10-09 | Discharge: 2024-10-09 | Disposition: A | Discharge status: Home / Self Care | Attending: Emergency Medicine | Admitting: Emergency Medicine

## 2024-10-09 ENCOUNTER — Encounter (HOSPITAL_COMMUNITY): Payer: Self-pay | Admitting: Emergency Medicine

## 2024-10-09 DIAGNOSIS — E162 Hypoglycemia, unspecified: Secondary | ICD-10-CM

## 2024-10-09 DIAGNOSIS — R569 Unspecified convulsions: Secondary | ICD-10-CM | POA: Diagnosis present

## 2024-10-09 DIAGNOSIS — Z7984 Long term (current) use of oral hypoglycemic drugs: Secondary | ICD-10-CM | POA: Insufficient documentation

## 2024-10-09 DIAGNOSIS — Z794 Long term (current) use of insulin: Secondary | ICD-10-CM | POA: Insufficient documentation

## 2024-10-09 DIAGNOSIS — E11649 Type 2 diabetes mellitus with hypoglycemia without coma: Secondary | ICD-10-CM | POA: Insufficient documentation

## 2024-10-09 LAB — CBC WITH DIFFERENTIAL/PLATELET
Abs Immature Granulocytes: 0.04 K/uL (ref 0.00–0.07)
Basophils Absolute: 0 K/uL (ref 0.0–0.1)
Basophils Relative: 1 %
Eosinophils Absolute: 0 K/uL (ref 0.0–0.5)
Eosinophils Relative: 0 %
HCT: 39.9 % (ref 36.0–46.0)
Hemoglobin: 13.4 g/dL (ref 12.0–15.0)
Immature Granulocytes: 1 %
Lymphocytes Relative: 31 %
Lymphs Abs: 2.6 K/uL (ref 0.7–4.0)
MCH: 32.6 pg (ref 26.0–34.0)
MCHC: 33.6 g/dL (ref 30.0–36.0)
MCV: 97.1 fL (ref 80.0–100.0)
Monocytes Absolute: 1 K/uL (ref 0.1–1.0)
Monocytes Relative: 12 %
Neutro Abs: 4.7 K/uL (ref 1.7–7.7)
Neutrophils Relative %: 55 %
Platelets: 232 K/uL (ref 150–400)
RBC: 4.11 MIL/uL (ref 3.87–5.11)
RDW: 12.3 % (ref 11.5–15.5)
WBC: 8.3 K/uL (ref 4.0–10.5)
nRBC: 0 % (ref 0.0–0.2)

## 2024-10-09 LAB — BASIC METABOLIC PANEL WITH GFR
Anion gap: 11 (ref 5–15)
BUN: 27 mg/dL — ABNORMAL HIGH (ref 8–23)
CO2: 27 mmol/L (ref 22–32)
Calcium: 9.4 mg/dL (ref 8.9–10.3)
Chloride: 105 mmol/L (ref 98–111)
Creatinine, Ser: 0.87 mg/dL (ref 0.44–1.00)
GFR, Estimated: >60 mL/min (ref >60)
Glucose, Bld: 69 mg/dL — ABNORMAL LOW (ref 70–99)
Potassium: 3.6 mmol/L (ref 3.5–5.1)
Sodium: 142 mmol/L (ref 135–145)

## 2024-10-09 LAB — CBG MONITORING, ED
Glucose-Capillary: 85 mg/dL (ref 70–99)
Glucose-Capillary: 58 mg/dL — ABNORMAL LOW (ref 70–99)
Glucose-Capillary: 316 mg/dL — ABNORMAL HIGH (ref 70–99)
Glucose-Capillary: 356 mg/dL — ABNORMAL HIGH (ref 70–99)

## 2024-10-09 MED ORDER — DEXTROSE 50 % IV SOLN
50.0000 mL | Freq: Once | INTRAVENOUS | Status: AC
  Administered 2024-10-09: 50 mL via INTRAVENOUS
  Filled 2024-10-09: qty 50
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
## 2024-10-09 NOTE — ED Notes (Signed)
 Pt given snack cake.

## 2024-10-09 NOTE — Discharge Instructions (Signed)
 Your blood sugar got low because you used the fast acting insulin  rather than your long acting insulin . GLARGINE is your long acting and the one you take at night. You are ok to start taking this again this evening at your normal time and normal dose.   The Lispro that you took should be on a sliding scale during the day depending on your blood sugars. If you don't have a sliding scale, DO NOT take that medication until further instruction from your primary provider.

## 2024-10-09 NOTE — ED Triage Notes (Signed)
 Ems called out for seizures, but when they arrived pt's blood sugar was 35 and she was unresponsive. Ems gave pt of d10 and blood sugar in 90s now.

## 2024-10-09 NOTE — ED Notes (Signed)
 Pt given soda, crackers and peanut butter.

## 2024-12-13 ENCOUNTER — Telehealth: Payer: Self-pay

## 2024-12-13 NOTE — Telephone Encounter (Signed)
 Received call from pt who is trying to reach Dermatology for referral.  LVM on pts phone with phone number to call [facility].

## 2025-05-07 ENCOUNTER — Ambulatory Visit: Payer: Self-pay
